# Patient Record
Sex: Male | Born: 1960 | Race: Black or African American | Hispanic: No | Marital: Married | Smoking: Never smoker
Health system: Southern US, Community
[De-identification: ages and names within clinical notes are randomized; demographics above are authoritative.]

## PROBLEM LIST (undated history)

## (undated) DIAGNOSIS — E291 Testicular hypofunction: Secondary | ICD-10-CM

## (undated) DIAGNOSIS — N183 Chronic kidney disease, stage 3 unspecified: Secondary | ICD-10-CM

## (undated) DIAGNOSIS — N529 Male erectile dysfunction, unspecified: Secondary | ICD-10-CM

## (undated) DIAGNOSIS — E119 Type 2 diabetes mellitus without complications: Secondary | ICD-10-CM

## (undated) DIAGNOSIS — I4510 Unspecified right bundle-branch block: Secondary | ICD-10-CM

## (undated) DIAGNOSIS — N201 Calculus of ureter: Secondary | ICD-10-CM

## (undated) DIAGNOSIS — I10 Essential (primary) hypertension: Secondary | ICD-10-CM

## (undated) DIAGNOSIS — Z794 Long term (current) use of insulin: Secondary | ICD-10-CM

## (undated) DIAGNOSIS — Z87442 Personal history of urinary calculi: Secondary | ICD-10-CM

## (undated) DIAGNOSIS — E785 Hyperlipidemia, unspecified: Secondary | ICD-10-CM

## (undated) DIAGNOSIS — G629 Polyneuropathy, unspecified: Secondary | ICD-10-CM

## (undated) DIAGNOSIS — G4730 Sleep apnea, unspecified: Secondary | ICD-10-CM

## (undated) DIAGNOSIS — N189 Chronic kidney disease, unspecified: Secondary | ICD-10-CM

## (undated) DIAGNOSIS — G4733 Obstructive sleep apnea (adult) (pediatric): Secondary | ICD-10-CM

## (undated) DIAGNOSIS — Z9989 Dependence on other enabling machines and devices: Secondary | ICD-10-CM

## (undated) DIAGNOSIS — C61 Malignant neoplasm of prostate: Secondary | ICD-10-CM

## (undated) HISTORY — DX: Testicular hypofunction: E29.1

## (undated) HISTORY — DX: Essential (primary) hypertension: I10

## (undated) HISTORY — DX: Unspecified right bundle-branch block: I45.10

## (undated) HISTORY — DX: Chronic kidney disease, unspecified: N18.9

## (undated) HISTORY — DX: Hyperlipidemia, unspecified: E78.5

## (undated) HISTORY — DX: Male erectile dysfunction, unspecified: N52.9

## (undated) HISTORY — DX: Sleep apnea, unspecified: G47.30

---

## 1999-05-03 HISTORY — PX: RHINOPLASTY: SUR1284

## 2000-05-02 HISTORY — PX: ANKLE SURGERY: SHX546

## 2002-01-14 ENCOUNTER — Encounter: Admission: RE | Admit: 2002-01-14 | Discharge: 2002-02-12 | Payer: Self-pay | Admitting: Internal Medicine

## 2003-03-31 ENCOUNTER — Encounter: Admission: RE | Admit: 2003-03-31 | Discharge: 2003-06-29 | Payer: Self-pay | Admitting: Internal Medicine

## 2010-10-28 ENCOUNTER — Encounter (HOSPITAL_COMMUNITY): Payer: Self-pay

## 2010-10-28 ENCOUNTER — Emergency Department (HOSPITAL_COMMUNITY): Payer: BC Managed Care – PPO

## 2010-10-28 ENCOUNTER — Emergency Department (HOSPITAL_COMMUNITY)
Admission: EM | Admit: 2010-10-28 | Discharge: 2010-10-28 | Disposition: A | Discharge status: Home / Self Care | Payer: BC Managed Care – PPO | Attending: Emergency Medicine | Admitting: Emergency Medicine

## 2010-10-28 DIAGNOSIS — N200 Calculus of kidney: Secondary | ICD-10-CM | POA: Insufficient documentation

## 2010-10-28 DIAGNOSIS — E119 Type 2 diabetes mellitus without complications: Secondary | ICD-10-CM | POA: Insufficient documentation

## 2010-10-28 DIAGNOSIS — R112 Nausea with vomiting, unspecified: Secondary | ICD-10-CM | POA: Insufficient documentation

## 2010-10-28 DIAGNOSIS — R3129 Other microscopic hematuria: Secondary | ICD-10-CM | POA: Insufficient documentation

## 2010-10-28 DIAGNOSIS — R109 Unspecified abdominal pain: Secondary | ICD-10-CM | POA: Insufficient documentation

## 2010-10-28 LAB — URINALYSIS, ROUTINE W REFLEX MICROSCOPIC
Bilirubin Urine: NEGATIVE
Nitrite: NEGATIVE
Protein, ur: 30 mg/dL — AB
Specific Gravity, Urine: 1.026 (ref 1.005–1.030)
Urobilinogen, UA: 0.2 mg/dL (ref 0.0–1.0)

## 2010-10-28 LAB — BASIC METABOLIC PANEL
Calcium: 9.6 mg/dL (ref 8.4–10.5)
GFR calc Af Amer: >60 mL/min (ref >60)
GFR calc non Af Amer: 50 mL/min — ABNORMAL LOW (ref >60)
Potassium: 3.6 mEq/L (ref 3.5–5.1)
Sodium: 139 mEq/L (ref 135–145)

## 2010-10-28 LAB — DIFFERENTIAL
Basophils Absolute: 0 10*3/uL (ref 0.0–0.1)
Basophils Relative: 0 % (ref 0–1)
Eosinophils Absolute: 0.1 10*3/uL (ref 0.0–0.7)
Eosinophils Relative: 1 % (ref 0–5)
Monocytes Absolute: 0.7 10*3/uL (ref 0.1–1.0)
Monocytes Relative: 7 % (ref 3–12)

## 2010-10-28 LAB — CBC
MCH: 28.7 pg (ref 26.0–34.0)
MCHC: 33.5 g/dL (ref 30.0–36.0)
RDW: 12.4 % (ref 11.5–15.5)

## 2010-10-28 LAB — URINE MICROSCOPIC-ADD ON

## 2013-03-12 ENCOUNTER — Other Ambulatory Visit: Payer: Self-pay | Admitting: Internal Medicine

## 2013-04-04 ENCOUNTER — Encounter: Payer: Self-pay | Admitting: Internal Medicine

## 2013-04-10 ENCOUNTER — Encounter: Payer: Self-pay | Admitting: Internal Medicine

## 2013-04-10 DIAGNOSIS — E1129 Type 2 diabetes mellitus with other diabetic kidney complication: Secondary | ICD-10-CM | POA: Insufficient documentation

## 2013-04-10 DIAGNOSIS — E349 Endocrine disorder, unspecified: Secondary | ICD-10-CM | POA: Insufficient documentation

## 2013-04-10 DIAGNOSIS — I10 Essential (primary) hypertension: Secondary | ICD-10-CM | POA: Insufficient documentation

## 2013-04-10 DIAGNOSIS — E1169 Type 2 diabetes mellitus with other specified complication: Secondary | ICD-10-CM | POA: Insufficient documentation

## 2013-04-10 DIAGNOSIS — E559 Vitamin D deficiency, unspecified: Secondary | ICD-10-CM | POA: Insufficient documentation

## 2013-04-10 NOTE — Progress Notes (Signed)
This encounter was created in error - please disregard.

## 2013-04-20 ENCOUNTER — Other Ambulatory Visit: Payer: Self-pay | Admitting: Internal Medicine

## 2013-04-22 ENCOUNTER — Other Ambulatory Visit: Payer: Self-pay | Admitting: Physician Assistant

## 2013-04-22 ENCOUNTER — Ambulatory Visit (INDEPENDENT_AMBULATORY_CARE_PROVIDER_SITE_OTHER): Payer: BC Managed Care – PPO | Admitting: Physician Assistant

## 2013-04-22 ENCOUNTER — Encounter: Payer: Self-pay | Admitting: Physician Assistant

## 2013-04-22 VITALS — BP 132/82 | HR 76 | Temp 97.9°F | Resp 16 | Ht 73.5 in | Wt 296.0 lb

## 2013-04-22 DIAGNOSIS — Z79899 Other long term (current) drug therapy: Secondary | ICD-10-CM

## 2013-04-22 DIAGNOSIS — E782 Mixed hyperlipidemia: Secondary | ICD-10-CM

## 2013-04-22 DIAGNOSIS — R7309 Other abnormal glucose: Secondary | ICD-10-CM

## 2013-04-22 DIAGNOSIS — I10 Essential (primary) hypertension: Secondary | ICD-10-CM

## 2013-04-22 DIAGNOSIS — E559 Vitamin D deficiency, unspecified: Secondary | ICD-10-CM

## 2013-04-22 DIAGNOSIS — E119 Type 2 diabetes mellitus without complications: Secondary | ICD-10-CM

## 2013-04-22 LAB — HEPATIC FUNCTION PANEL
ALT: 36 U/L (ref 0–53)
AST: 19 U/L (ref 0–37)
Albumin: 4.4 g/dL (ref 3.5–5.2)
Bilirubin, Direct: 0.3 mg/dL (ref 0.0–0.3)
Total Protein: 6.8 g/dL (ref 6.0–8.3)

## 2013-04-22 LAB — BASIC METABOLIC PANEL WITH GFR
BUN: 17 mg/dL (ref 6–23)
CO2: 31 mEq/L (ref 19–32)
Calcium: 10 mg/dL (ref 8.4–10.5)
Creat: 1.48 mg/dL — ABNORMAL HIGH (ref 0.50–1.35)
GFR, Est African American: 63 mL/min
Glucose, Bld: 135 mg/dL — ABNORMAL HIGH (ref 70–99)

## 2013-04-22 LAB — CBC WITH DIFFERENTIAL/PLATELET
Basophils Relative: 0 % (ref 0–1)
Eosinophils Absolute: 0.3 10*3/uL (ref 0.0–0.7)
Eosinophils Relative: 5 % (ref 0–5)
Hemoglobin: 18.3 g/dL — ABNORMAL HIGH (ref 13.0–17.0)
Lymphs Abs: 2.8 10*3/uL (ref 0.7–4.0)
MCH: 29.1 pg (ref 26.0–34.0)
MCHC: 35.4 g/dL (ref 30.0–36.0)
MCV: 82.3 fL (ref 78.0–100.0)
Monocytes Relative: 6 % (ref 3–12)
Neutrophils Relative %: 39 % — ABNORMAL LOW (ref 43–77)
RBC: 6.28 MIL/uL — ABNORMAL HIGH (ref 4.22–5.81)

## 2013-04-22 LAB — HEMOGLOBIN A1C: Mean Plasma Glucose: 166 mg/dL — ABNORMAL HIGH (ref <117)

## 2013-04-22 LAB — LIPID PANEL
Cholesterol: 142 mg/dL (ref 0–200)
Triglycerides: 136 mg/dL (ref <150)
VLDL: 27 mg/dL (ref 0–40)

## 2013-04-22 NOTE — Patient Instructions (Signed)
   Bad carbs also include fruit juice, alcohol, and sweet tea. These are empty calories that do not signal to your brain that you are full.   Please remember the good carbs are still carbs which convert into sugar. So please measure them out no more than 1/2-1 cup of rice, oatmeal, pasta, and beans.  Veggies are however free foods! Pile them on.   I like lean protein at every meal such as chicken, turkey, pork chops, cottage cheese, etc. Just do not fry these meats and please center your meal around vegetable, the meats should be a side dish.   No all fruit is created equal. Please see the list below, the fruit at the bottom is higher in sugars than the fruit at the top   Remember exercise is great for your cardiovascular health and can help with weight loss but YOU CAN NOT OUT RUN YOUR FORK!     

## 2013-04-22 NOTE — Progress Notes (Signed)
HPI Patient presents for 3 month follow up with hypertension, hyperlipidemia, diabetes and vitamin D. Patient's blood pressure has been controlled at home.  Patient denies chest pain, shortness of breath, dizziness.  Patient's cholesterol is diet controlled.The cholesterol last visit was LDL 45, trigs 172, HDL 44.  The patient has been working on diet and exercise for Diabetes, and denies changes in vision, polys, and paresthesias. 8.9 (11.2). He states his average sugar at home is 112.  Last kidney function was GFR of 70, BUN 18, Cr 1.37 (1.62)  His last hemoglobin was 17.1.  Patient is on Vitamin D supplement.   Current Medications:  Current Outpatient Prescriptions on File Prior to Visit  Medication Sig Dispense Refill  . aspirin EC 81 MG tablet Take 81 mg by mouth daily.      . Cholecalciferol (VITAMIN D-3) 5000 UNITS TABS Take 5,000 Units by mouth daily.      . INVOKANA 300 MG TABS TAKE ONE TABLET BY MOUTH ONE TIME DAILY for diabetes  30 tablet  2  . lisinopril (PRINIVIL,ZESTRIL) 20 MG tablet Take 10 mg by mouth daily.      . metFORMIN (GLUCOPHAGE-XR) 500 MG 24 hr tablet Take 1,000 mg by mouth 2 (two) times daily.      Marland Kitchen testosterone cypionate (DEPOTESTOTERONE CYPIONATE) 200 MG/ML injection Inject 200 mg into the muscle every 14 (fourteen) days. 1 1/2 cc every 2 weeks      . VICTOZA 18 MG/3ML SOPN INJECT 1.8 MG DAILY  9 mL  4   No current facility-administered medications on file prior to visit.   Medical History:  Past Medical History  Diagnosis Date  . Hypertension   . Hyperlipidemia   . Diabetes mellitus without complication   . Hypogonadism male   . Right bundle branch block   . Obstructive sleep apnea   . Kidney stones    Allergies: No Known Allergies  ROS Constitutional: Denies fever, chills, headaches, insomnia, fatigue, night sweats Eyes: Denies redness, blurred vision, diplopia, discharge, itchy, watery eyes.  ENT: Denies congestion, post nasal drip, sore throat,  earache, dental pain, Tinnitus, Vertigo, Sinus pain, snoring.  Cardio: Denies chest pain, palpitations, irregular heartbeat, dyspnea, diaphoresis, orthopnea, PND, claudication, edema Respiratory: denies cough, shortness of breath, wheezing.  Gastrointestinal: Denies dysphagia, heartburn, AB pain/ cramps, N/V, diarrhea, constipation, hematemesis, melena, hematochezia,  hemorrhoids Genitourinary: Denies dysuria, frequency, urgency, nocturia, hesitancy, discharge, hematuria, flank pain Musculoskeletal: Denies myalgia, stiffness, pain, swelling and strain/sprain. Skin: Denies pruritis, rash, changing in skin lesion Neuro: Denies Weakness, tremor, incoordination, spasms, pain Psychiatric: Denies confusion, memory loss, sensory loss Endocrine: Denies change in weight, skin, hair change, nocturia Diabetic Polys, Denies visual blurring, hyper /hypo glycemic episodes, and paresthesia, Heme/Lymph: Denies Excessive bleeding, bruising, enlarged lymph nodes  Family history- Review and unchanged Social history- Review and unchanged Physical Exam: Filed Vitals:   04/22/13 1641  BP: 132/82  Pulse: 76  Temp: 97.9 F (36.6 C)  Resp: 16   Filed Weights   04/22/13 1641  Weight: 296 lb (134.265 kg)   General Appearance: Well nourished, in no apparent distress. Eyes: PERRLA, EOMs, conjunctiva no swelling or erythema Sinuses: No Frontal/maxillary tenderness ENT/Mouth: Ext aud canals clear, TMs without erythema, bulging. No erythema, swelling, or exudate on post pharynx.  Tonsils not swollen or erythematous. Hearing normal.  Neck: Supple, thyroid normal.  Respiratory: Respiratory effort normal, BS equal bilaterally without rales, rhonchi, wheezing or stridor.  Cardio: RRR with no MRGs. Brisk peripheral pulses without edema.  Abdomen:  Soft, + BS.  Non tender, no guarding, rebound, hernias, masses. Lymphatics: Non tender without lymphadenopathy.  Musculoskeletal: Full ROM, 5/5 strength, normal gait.   Skin: Warm, dry without rashes, lesions, ecchymosis.  Neuro: Cranial nerves intact. No cerebellar symptoms. Sensation intact.  Psych: Awake and oriented X 3, normal affect, Insight and Judgment appropriate.   Assessment and Plan:  Hypertension: Continue medication, monitor blood pressure at home. Continue DASH diet. Cholesterol: Continue diet and exercise. Check cholesterol.  Diabetes-Continue diet and exercise. 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  Vitamin D Def- check level and continue medications.  Hypogonadism- continue testosterone- last shot one week ago so will not check level.  Continue diet and meds as discussed. Further disposition pending results of labs. Discussed med's effects and SE's.    Quentin Mulling 4:54 PM

## 2013-04-23 LAB — INSULIN, FASTING: Insulin fasting, serum: 17 u[IU]/mL (ref 3–28)

## 2013-04-23 LAB — VITAMIN D 25 HYDROXY (VIT D DEFICIENCY, FRACTURES): Vit D, 25-Hydroxy: 55 ng/mL (ref 30–89)

## 2013-04-23 LAB — IRON AND TIBC
%SAT: 36 % (ref 20–55)
Iron: 104 ug/dL (ref 42–165)
UIBC: 188 ug/dL (ref 125–400)

## 2013-04-23 LAB — FERRITIN: Ferritin: 167 ng/mL (ref 22–322)

## 2013-06-09 ENCOUNTER — Other Ambulatory Visit: Payer: Self-pay | Admitting: Internal Medicine

## 2013-07-23 ENCOUNTER — Encounter: Payer: Self-pay | Admitting: Internal Medicine

## 2013-07-23 ENCOUNTER — Ambulatory Visit: Payer: Self-pay | Admitting: Physician Assistant

## 2013-07-30 ENCOUNTER — Other Ambulatory Visit: Payer: Self-pay | Admitting: Physician Assistant

## 2013-08-08 ENCOUNTER — Other Ambulatory Visit: Payer: Self-pay | Admitting: Physician Assistant

## 2013-08-15 ENCOUNTER — Encounter: Payer: Self-pay | Admitting: Physician Assistant

## 2013-08-15 ENCOUNTER — Ambulatory Visit (INDEPENDENT_AMBULATORY_CARE_PROVIDER_SITE_OTHER): Payer: BC Managed Care – PPO | Admitting: Physician Assistant

## 2013-08-15 VITALS — BP 120/80 | HR 84 | Temp 98.8°F | Resp 16 | Ht 73.5 in | Wt 280.0 lb

## 2013-08-15 DIAGNOSIS — J111 Influenza due to unidentified influenza virus with other respiratory manifestations: Secondary | ICD-10-CM

## 2013-08-15 DIAGNOSIS — J101 Influenza due to other identified influenza virus with other respiratory manifestations: Secondary | ICD-10-CM

## 2013-08-15 LAB — POC INFLUENZA A&B (BINAX/QUICKVUE)
INFLUENZA A, POC: NEGATIVE
INFLUENZA B, POC: POSITIVE

## 2013-08-15 MED ORDER — HYDROCODONE-ACETAMINOPHEN 5-325 MG PO TABS: 1.0000 | ORAL_TABLET | Freq: Four times a day (QID) | ORAL | Status: DC | PRN

## 2013-08-15 MED ORDER — PROMETHAZINE-CODEINE 6.25-10 MG/5ML PO SYRP: 5.0000 mL | ORAL_SOLUTION | Freq: Four times a day (QID) | ORAL | Status: DC | PRN

## 2013-08-15 MED ORDER — PREDNISONE 20 MG PO TABS: ORAL_TABLET | ORAL | Status: DC

## 2013-08-15 MED ORDER — AZITHROMYCIN 250 MG PO TABS: ORAL_TABLET | ORAL | Status: DC

## 2013-08-15 NOTE — Progress Notes (Signed)
   Subjective:    Patient ID: Carlos [name], male    DOB: 07/16/1960, 53 y.o.   MRN: [id number]  Sinus Problem This is a new problem. Episode onset: 2 days. There has been no fever. Associated symptoms include chills (at night with sweats. ), congestion, coughing, headaches, neck pain, a sore throat and swollen glands. Pertinent negatives include no diaphoresis, ear pain, hoarse voice, shortness of breath, sinus pressure or sneezing. Treatments tried: dayquil. The treatment provided no relief.    Wt Readings from Last 3 Encounters:  08/15/13 280 lb (127.007 kg)  04/22/13 296 lb (134.265 kg)   Review of Systems  Constitutional: Positive for chills (at night with sweats. ). Negative for fever and diaphoresis.  HENT: Positive for congestion, postnasal drip, rhinorrhea and sore throat. Negative for ear pain, hoarse voice, sinus pressure, sneezing, trouble swallowing and voice change.   Eyes: Negative.   Respiratory: Positive for cough and wheezing. Negative for chest tightness and shortness of breath.   Cardiovascular: Negative.   Gastrointestinal: Negative.   Genitourinary: Negative.   Musculoskeletal: Positive for neck pain.  Neurological: Positive for headaches.       Objective:   Physical Exam  Constitutional: He is oriented to person, place, and time. He appears well-developed and well-nourished.  HENT:  Head: Normocephalic and atraumatic.  Right Ear: External ear normal.  Left Ear: External ear normal.  Nose: Nose normal.  Mouth/Throat: Oropharynx is clear and moist.  Eyes: Conjunctivae are normal. Pupils are equal, round, and reactive to light.  Neck: Normal range of motion. Neck supple.  Cardiovascular: Normal rate, regular rhythm and normal heart sounds.   No murmur heard. Pulmonary/Chest: Effort normal. No respiratory distress. He has wheezes. He has no rales. He exhibits no tenderness.  Abdominal: Soft. Bowel sounds are normal.  Lymphadenopathy:    He has no cervical  adenopathy.  Neurological: He is alert and oriented to person, place, and time.  Skin: Skin is warm and dry.      Assessment & Plan:  Influenza B - Plan: predniSONE (DELTASONE) 20 MG tablet, promethazine-codeine (PHENERGAN WITH CODEINE) 6.25-10 MG/5ML syrup, azithromycin (ZITHROMAX) 250 MG tablet, POC Influenza A&B, HYDROcodone-acetaminophen (NORCO) 5-325 MG per tablet

## 2013-08-15 NOTE — Patient Instructions (Signed)

## 2013-08-22 LAB — HM DIABETES EYE EXAM

## 2013-09-02 ENCOUNTER — Encounter: Payer: Self-pay | Admitting: Physician Assistant

## 2013-09-02 ENCOUNTER — Ambulatory Visit (INDEPENDENT_AMBULATORY_CARE_PROVIDER_SITE_OTHER): Payer: BC Managed Care – PPO | Admitting: Physician Assistant

## 2013-09-02 VITALS — BP 110/78 | HR 80 | Temp 98.2°F | Resp 16 | Ht 73.5 in | Wt 279.0 lb

## 2013-09-02 DIAGNOSIS — Z125 Encounter for screening for malignant neoplasm of prostate: Secondary | ICD-10-CM

## 2013-09-02 DIAGNOSIS — Z0000 Encounter for general adult medical examination without abnormal findings: Secondary | ICD-10-CM

## 2013-09-02 DIAGNOSIS — E559 Vitamin D deficiency, unspecified: Secondary | ICD-10-CM

## 2013-09-02 DIAGNOSIS — E119 Type 2 diabetes mellitus without complications: Secondary | ICD-10-CM

## 2013-09-02 DIAGNOSIS — E291 Testicular hypofunction: Secondary | ICD-10-CM

## 2013-09-02 DIAGNOSIS — Z1211 Encounter for screening for malignant neoplasm of colon: Secondary | ICD-10-CM

## 2013-09-02 DIAGNOSIS — B351 Tinea unguium: Secondary | ICD-10-CM

## 2013-09-02 DIAGNOSIS — Z79899 Other long term (current) drug therapy: Secondary | ICD-10-CM

## 2013-09-02 DIAGNOSIS — E782 Mixed hyperlipidemia: Secondary | ICD-10-CM

## 2013-09-02 DIAGNOSIS — I10 Essential (primary) hypertension: Secondary | ICD-10-CM

## 2013-09-02 MED ORDER — SYRINGE (DISPOSABLE) 3 ML MISC: Status: DC

## 2013-09-02 MED ORDER — LIRAGLUTIDE 18 MG/3ML ~~LOC~~ SOPN: PEN_INJECTOR | SUBCUTANEOUS | Status: DC

## 2013-09-02 MED ORDER — TERBINAFINE HCL 250 MG PO TABS: 250.0000 mg | ORAL_TABLET | Freq: Every day | ORAL | Status: DC

## 2013-09-02 MED ORDER — TESTOSTERONE CYPIONATE 200 MG/ML IM SOLN: 200.0000 mg | INTRAMUSCULAR | Status: DC

## 2013-09-02 NOTE — Patient Instructions (Signed)
Preventative Care for Adults, Male       REGULAR HEALTH EXAMS:  A routine yearly physical is a good way to check in with your primary care provider about your health and preventive screening. It is also an opportunity to share updates about your health and any concerns you have, and receive a thorough all-over exam.   Most health insurance companies pay for at least some preventative services.  Check with your health plan for specific coverages.  WHAT PREVENTATIVE SERVICES DO MEN NEED?  Adult men should have their weight and blood pressure checked regularly.   Men age 35 and older should have their cholesterol levels checked regularly.  Beginning at age 50 and continuing to age 75, men should be screened for colorectal cancer.  Certain people should may need continued testing until age 85.  Other cancer screening may include exams for testicular and prostate cancer.  Updating vaccinations is part of preventative care.  Vaccinations help protect against diseases such as the flu.  Lab tests are generally done as part of preventative care to screen for anemia and blood disorders, to screen for problems with the kidneys and liver, to screen for bladder problems, to check blood sugar, and to check your cholesterol level.  Preventative services generally include counseling about diet, exercise, avoiding tobacco, drugs, excessive alcohol consumption, and sexually transmitted infections.    GENERAL RECOMMENDATIONS FOR GOOD HEALTH:  Healthy diet:  Eat a variety of foods, including fruit, vegetables, animal or vegetable protein, such as meat, fish, chicken, and eggs, or beans, lentils, tofu, and grains, such as rice.  Drink plenty of water daily.  Decrease saturated fat in the diet, avoid lots of red meat, processed foods, sweets, fast foods, and fried foods.  Exercise:  Aerobic exercise helps maintain good heart health. At least 30-40 minutes of moderate-intensity exercise is recommended.  For example, a brisk walk that increases your heart rate and breathing. This should be done on most days of the week.   Find a type of exercise or a variety of exercises that you enjoy so that it becomes a part of your daily life.  Examples are running, walking, swimming, water aerobics, and biking.  For motivation and support, explore group exercise such as aerobic class, spin class, Zumba, Yoga,or  martial arts, etc.    Set exercise goals for yourself, such as a certain weight goal, walk or run in a race such as a 5k walk/run.  Speak to your primary care provider about exercise goals.  Disease prevention:  If you smoke or chew tobacco, find out from your caregiver how to quit. It can literally save your life, no matter how long you have been a tobacco user. If you do not use tobacco, never begin.   Maintain a healthy diet and normal weight. Increased weight leads to problems with blood pressure and diabetes.   The Body Mass Index or BMI is a way of measuring how much of your body is fat. Having a BMI above 27 increases the risk of heart disease, diabetes, hypertension, stroke and other problems related to obesity. Your caregiver can help determine your BMI and based on it develop an exercise and dietary program to help you achieve or maintain this important measurement at a healthful level.  High blood pressure causes heart and blood vessel problems.  Persistent high blood pressure should be treated with medicine if weight loss and exercise do not work.   Fat and cholesterol leaves deposits in your arteries   that can block them. This causes heart disease and vessel disease elsewhere in your body.  If your cholesterol is found to be high, or if you have heart disease or certain other medical conditions, then you may need to have your cholesterol monitored frequently and be treated with medication.   Ask if you should have a stress test if your history suggests this. A stress test is a test done on  a treadmill that looks for heart disease. This test can find disease prior to there being a problem.  Avoid drinking alcohol in excess (more than two drinks per day).  Avoid use of street drugs. Do not share needles with anyone. Ask for professional help if you need assistance or instructions on stopping the use of alcohol, cigarettes, and/or drugs.  Brush your teeth twice a day with fluoride toothpaste, and floss once a day. Good oral hygiene prevents tooth decay and gum disease. The problems can be painful, unattractive, and can cause other health problems. Visit your dentist for a routine oral and dental check up and preventive care every 6-12 months.   Look at your skin regularly.  Use a mirror to look at your back. Notify your caregivers of changes in moles, especially if there are changes in shapes, colors, a size larger than a pencil eraser, an irregular border, or development of new moles.  Safety:  Use seatbelts 100% of the time, whether driving or as a passenger.  Use safety devices such as hearing protection if you work in environments with loud noise or significant background noise.  Use safety glasses when doing any work that could send debris in to the eyes.  Use a helmet if you ride a bike or motorcycle.  Use appropriate safety gear for contact sports.  Talk to your caregiver about gun safety.  Use sunscreen with a SPF (or skin protection factor) of 15 or greater.  Lighter skinned people are at a greater risk of skin cancer. Don't forget to also wear sunglasses in order to protect your eyes from too much damaging sunlight. Damaging sunlight can accelerate cataract formation.   Practice safe sex. Use condoms. Condoms are used for birth control and to help reduce the spread of sexually transmitted infections (or STIs).  Some of the STIs are gonorrhea (the clap), chlamydia, syphilis, trichomonas, herpes, HPV (human papilloma virus) and HIV (human immunodeficiency virus) which causes AIDS.  The herpes, HIV and HPV are viral illnesses that have no cure. These can result in disability, cancer and death.   Keep carbon monoxide and smoke detectors in your home functioning at all times. Change the batteries every 6 months or use a model that plugs into the wall.   Vaccinations:  Stay up to date with your tetanus shots and other required immunizations. You should have a booster for tetanus every 10 years. Be sure to get your flu shot every year, since 5%-20% of the [place] population comes down with the flu. The flu vaccine changes each year, so being vaccinated once is not enough. Get your shot in the fall, before the flu season peaks.   Other vaccines to consider:  Pneumococcal vaccine to protect against certain types of pneumonia.  This is normally recommended for adults age 65 or older.  However, adults younger than 51 years old with certain underlying conditions such as diabetes, heart or lung disease should also receive the vaccine.  Shingles vaccine to protect against Varicella Zoster if you are older than age 60, or younger   than 51 years old with certain underlying illness.  Hepatitis A vaccine to protect against a form of infection of the liver by a virus acquired from food.  Hepatitis B vaccine to protect against a form of infection of the liver by a virus acquired from blood or body fluids, particularly if you work in health care.  If you plan to travel internationally, check with your local health department for specific vaccination recommendations.  Cancer Screening:  Most routine colon cancer screening begins at the age of 50. On a yearly basis, doctors may provide special easy to use take-home tests to check for hidden blood in the stool. Sigmoidoscopy or colonoscopy can detect the earliest forms of colon cancer and is life saving. These tests use a small camera at the end of a tube to directly examine the colon. Speak to your caregiver about this at age 50, when routine  screening begins (and is repeated every 5 years unless early forms of pre-cancerous polyps or small growths are found).   At the age of 50 men usually start screening for prostate cancer every year. Screening may begin at a younger age for those with higher risk. Those at higher risk include African-Americans or having a family history of prostate cancer. There are two types of tests for prostate cancer:   Prostate-specific antigen (PSA) testing. Recent studies raise questions about prostate cancer using PSA and you should discuss this with your caregiver.   Digital rectal exam (in which your doctor's lubricated and gloved finger feels for enlargement of the prostate through the anus).   Screening for testicular cancer.  Do a monthly exam of your testicles. Gently roll each testicle between your thumb and fingers, feeling for any abnormal lumps. The best time to do this is after a hot shower or bath when the tissues are looser. Notify your caregivers of any lumps, tenderness or changes in size or shape immediately.     

## 2013-09-02 NOTE — Progress Notes (Signed)
Complete Physical HPI 53 y.o. male  presents for a complete physical. His blood pressure has been controlled at home, today their BP is BP: 110/78 mmHg He does not workout, does some lawn work. He denies chest pain, shortness of breath, dizziness.  He is not on cholesterol medication and denies myalgias. His cholesterol is at goal. The cholesterol last visit was:   Lab Results  Component Value Date   CHOL 142 04/22/2013   HDL 46 04/22/2013   LDLCALC 69 04/22/2013   TRIG 136 04/22/2013   CHOLHDL 3.1 04/22/2013   He has been working on diet and exercise for diabetes, and denies paresthesia of the feet, polydipsia and polyuria. Last A1C in the office was:  Lab Results  Component Value Date   HGBA1C 7.4* 04/22/2013   Patient is on Vitamin D supplement.  Wt Readings from Last 3 Encounters:  09/02/13 279 lb (126.554 kg)  08/15/13 280 lb (127.007 kg)  04/22/13 296 lb (134.265 kg)   He is has a history of testosterone deficiency and is not on testosterone replacement at this time, his prescription ran out. He states that the testosterone did help with his energy, libido, muscle mass. He would like to get back on it.  Obesity- he has done a great job with weight loss.  OSA on CPAP.   Current Medications:  Current Outpatient Prescriptions on File Prior to Visit  Medication Sig Dispense Refill  . aspirin EC 81 MG tablet Take 81 mg by mouth daily.      Marland Kitchen azithromycin (ZITHROMAX) 250 MG tablet 2 tablets by mouth today then one tablet daily for 4 days.  6 tablet  1  . Cholecalciferol (VITAMIN D-3) 5000 UNITS TABS Take 5,000 Units by mouth daily.      Marland Kitchen HYDROcodone-acetaminophen (NORCO) 5-325 MG per tablet Take 1 tablet by mouth every 6 (six) hours as needed for moderate pain.  60 tablet  0  . INVOKANA 300 MG TABS TAKE ONE TABLET BY MOUTH ONE TIME DAILY for diabetes  30 tablet  2  . lisinopril (PRINIVIL,ZESTRIL) 20 MG tablet Take 10 mg by mouth daily.      . metFORMIN (GLUCOPHAGE-XR) 500 MG  24 hr tablet TAKE FOUR TABLETS BY MOUTH DAILY   120 tablet  5  . predniSONE (DELTASONE) 20 MG tablet Take one pill two times daily for 3 days, take one pill daily for 4 days.  10 tablet  0  . promethazine-codeine (PHENERGAN WITH CODEINE) 6.25-10 MG/5ML syrup Take 5 mLs by mouth every 6 (six) hours as needed for cough.  240 mL  0  . testosterone cypionate (DEPOTESTOTERONE CYPIONATE) 200 MG/ML injection Inject 200 mg into the muscle every 14 (fourteen) days. 1 1/2 cc every 2 weeks      . VICTOZA 18 MG/3ML SOPN INJECT 1.8 MG DAILY  9 mL  4   No current facility-administered medications on file prior to visit.   Health Maintenance:  Immunization History  Administered Date(s) Administered  . DTaP 04/02/2012  . Pneumococcal Polysaccharide-23 04/02/2012   Tetanus: 2013 Pneumovax: 2013 Flu vaccine: did not have it, got the flu Zostavax: N/A DEXA: N/A Colonoscopy: DUE this year EGD: N/A  Allergies: No Known Allergies Medical History:  Past Medical History  Diagnosis Date  . Hypertension   . Hyperlipidemia   . Diabetes mellitus without complication   . Hypogonadism male   . Right bundle branch block   . Obstructive sleep apnea   . Kidney stones  Surgical History:  Past Surgical History  Procedure Laterality Date  . Ankle surgery  2002  . Rhinoplasty  2001   Family History:  Family History  Problem Relation Age of Onset  . Hypertension Mother   . Cancer Mother     breast cancer  . Diabetes Father    Social History:   History  Substance Use Topics  . Smoking status: Never Smoker   . Smokeless tobacco: Not on file  . Alcohol Use: Yes   ROS:  [X]  = complains of  [ ]  = denies  General: Fatigue [ ]  Fever [ ]  Chills [ ]  Weakness [ ]   Insomnia [ ]  Weight change [ ]  Night sweats [ ]   Change in appetite [ ]  Eyes: Redness [ ]  Blurred vision [ ]  Diplopia [ ]  Discharge [ ]   ENT: Congestion [ ]  Sinus Pain [ ]  Post Nasal Drip [ ]  Sore Throat [ ]  Earache [ ]  hearing loss [ ]   Tinnitus [ ]  Snoring [ ]   Cardiac: Chest pain/pressure [ ]  SOB [ ]  Orthopnea [ ]   Palpitations [ ]   Paroxysmal nocturnal dyspnea[ ]  Claudication [ ]  Edema [ ]   Pulmonary: Cough [ ]  Wheezing[ ]   SOB [ ]   Pleurisy [ ]   GI: Nausea [ ]  Vomiting[ ]  Dysphagia[ ]  Heartburn[ ]  Abdominal pain [ ]  Constipation [ ] ; Diarrhea [ ]  BRBPR [ ]  Melena[ ]  Bloating [ ]  Hemorrhoids [ ]   GU: Hematuria[ ]  Dysuria [ ]  Nocturia[ ]  Urgency [ ]   Hesitancy [ ]  Discharge [ ]  Frequency [ ]   Neuro: Headaches[ ]  Vertigo[ ]  Paresthesias[ ]  Spasm [ ]  Speech changes [ ]  Incoordination [ ]   Ortho: Arthritis [ ]  Joint pain [ ]  Muscle pain [ ]  Joint swelling [ ]  Back Pain [ ]  Skin:  Rash [ ]   Pruritis [ ]  Change in skin lesion [ ]   Psych: Depression[ ]  Anxiety[ ]  Confusion [ ]  Memory loss [ ]   Heme/Lypmh: Bleeding [ ]  Bruising [ ]  Enlarged lymph nodes [ ]   Endocrine: Visual blurring [ ]  Paresthesia [ ]  Polyuria [ ]  Polydypsea [ ]    Heat/cold intolerance [ ]  Hypoglycemia [ ]   Physical Exam: Estimated body mass index is 36.31 kg/(m^2) as calculated from the following:   Height as of this encounter: 6' 1.5" (1.867 m).   Weight as of this encounter: 279 lb (126.554 kg). BP 110/78  Pulse 80  Temp(Src) 98.2 F (36.8 C)  Resp 16  Ht 6' 1.5" (1.867 m)  Wt 279 lb (126.554 kg)  BMI 36.31 kg/m2 General Appearance: Well nourished, in no apparent distress. Eyes: PERRLA, EOMs, conjunctiva no swelling or erythema, normal fundi and vessels. Sinuses: No Frontal/maxillary tenderness ENT/Mouth: Ext aud canals clear, normal light reflex with TMs without erythema, bulging. Good dentition. No erythema, swelling, or exudate on post pharynx. Tonsils not swollen or erythematous. Hearing normal.  Neck: Supple, thyroid normal. No bruits Respiratory: Respiratory effort normal, BS equal bilaterally without rales, rhonchi, wheezing or stridor. Cardio: RRR without murmurs, rubs or gallops. Brisk peripheral pulses without edema.  Chest: symmetric, with  normal excursions and percussion. Abdomen: Soft, +BS. Non tender, no guarding, rebound, hernias, masses, or organomegaly. .  Lymphatics: Non tender without lymphadenopathy.  Genitourinary: defer Musculoskeletal: Full ROM all peripheral extremities,5/5 strength, and normal gait. Skin: Warm, dry without rashes, lesions, ecchymosis.  Neuro: Cranial nerves intact, reflexes equal bilaterally. Normal muscle tone, no cerebellar symptoms. Sensation intact.  Psych: Awake and oriented X 3, normal  affect, Insight and Judgment appropriate.   EKG: WNL no changes.  Assessment and Plan: Hypertension-- continue medications, DASH diet, exercise and monitor at home. Call if greater than 130/80.   Hyperlipidemia--continue medications, check lipids, decrease fatty foods, increase activity.   Diabetes mellitus without complication-Discussed general issues about diabetes pathophysiology and management., Educational material distributed., Suggested low cholesterol diet., Encouraged aerobic exercise., Discussed foot care., Reminded to get yearly retinal exam.  Hypogonadism male-Hypogonadism- continue to monitor, states medication is helping with symptoms of low T, will refill  Obstructive sleep apnea- cont CPAP  Kidney stones- remission  Obesity-Obesity with co morbidities- long discussion about weight loss, diet, and exercise onchomycosis- lamisil once daily- recheck LFTs- 6 weeks Colonoscopy  Discussed med's effects and SE's. Screening labs and tests as requested with regular follow-up as recommended.  Vicie Mutters 3:50 PM

## 2013-09-03 ENCOUNTER — Other Ambulatory Visit: Payer: Self-pay | Admitting: *Deleted

## 2013-09-03 ENCOUNTER — Other Ambulatory Visit: Payer: Self-pay

## 2013-09-03 DIAGNOSIS — IMO0002 Reserved for concepts with insufficient information to code with codable children: Secondary | ICD-10-CM

## 2013-09-03 DIAGNOSIS — E118 Type 2 diabetes mellitus with unspecified complications: Principal | ICD-10-CM

## 2013-09-03 DIAGNOSIS — E1165 Type 2 diabetes mellitus with hyperglycemia: Secondary | ICD-10-CM

## 2013-09-03 LAB — CBC WITH DIFFERENTIAL/PLATELET
BASOS PCT: 1 % (ref 0–1)
Basophils Absolute: 0 10*3/uL (ref 0.0–0.1)
Eosinophils Absolute: 0.1 10*3/uL (ref 0.0–0.7)
Eosinophils Relative: 3 % (ref 0–5)
HCT: 48.5 % (ref 39.0–52.0)
HEMOGLOBIN: 16.8 g/dL (ref 13.0–17.0)
LYMPHS ABS: 2.4 10*3/uL (ref 0.7–4.0)
Lymphocytes Relative: 48 % — ABNORMAL HIGH (ref 12–46)
MCH: 28.6 pg (ref 26.0–34.0)
MCHC: 34.6 g/dL (ref 30.0–36.0)
MCV: 82.5 fL (ref 78.0–100.0)
MONOS PCT: 6 % (ref 3–12)
Monocytes Absolute: 0.3 10*3/uL (ref 0.1–1.0)
NEUTROS ABS: 2.1 10*3/uL (ref 1.7–7.7)
Neutrophils Relative %: 42 % — ABNORMAL LOW (ref 43–77)
Platelets: 238 10*3/uL (ref 150–400)
RBC: 5.88 MIL/uL — AB (ref 4.22–5.81)
RDW: 13.7 % (ref 11.5–15.5)
WBC: 4.9 10*3/uL (ref 4.0–10.5)

## 2013-09-03 LAB — LIPID PANEL
Cholesterol: 116 mg/dL (ref 0–200)
HDL: 54 mg/dL (ref >39)
LDL CALC: 33 mg/dL (ref 0–99)
TRIGLYCERIDES: 146 mg/dL (ref <150)
Total CHOL/HDL Ratio: 2.1 Ratio
VLDL: 29 mg/dL (ref 0–40)

## 2013-09-03 LAB — TSH: TSH: 0.629 u[IU]/mL (ref 0.350–4.500)

## 2013-09-03 LAB — HEPATIC FUNCTION PANEL
ALT: 21 U/L (ref 0–53)
AST: 13 U/L (ref 0–37)
Albumin: 4.4 g/dL (ref 3.5–5.2)
Alkaline Phosphatase: 89 U/L (ref 39–117)
BILIRUBIN DIRECT: 0.3 mg/dL (ref 0.0–0.3)
Indirect Bilirubin: 1.1 mg/dL (ref 0.2–1.2)
Total Bilirubin: 1.4 mg/dL — ABNORMAL HIGH (ref 0.2–1.2)
Total Protein: 6.7 g/dL (ref 6.0–8.3)

## 2013-09-03 LAB — INSULIN, FASTING: Insulin fasting, serum: 15 u[IU]/mL (ref 3–28)

## 2013-09-03 LAB — HEMOGLOBIN A1C
Hgb A1c MFr Bld: 10.2 % — ABNORMAL HIGH (ref <5.7)
MEAN PLASMA GLUCOSE: 246 mg/dL — AB (ref <117)

## 2013-09-03 LAB — IRON AND TIBC
%SAT: 57 % — ABNORMAL HIGH (ref 20–55)
IRON: 144 ug/dL (ref 42–165)
TIBC: 251 ug/dL (ref 215–435)
UIBC: 107 ug/dL — ABNORMAL LOW (ref 125–400)

## 2013-09-03 LAB — PSA: PSA: 0.92 ng/mL (ref <=4.00)

## 2013-09-03 LAB — BASIC METABOLIC PANEL WITH GFR
BUN: 18 mg/dL (ref 6–23)
CHLORIDE: 106 meq/L (ref 96–112)
CO2: 25 meq/L (ref 19–32)
Calcium: 9.9 mg/dL (ref 8.4–10.5)
Creat: 1.3 mg/dL (ref 0.50–1.35)
GFR, EST AFRICAN AMERICAN: 74 mL/min
GFR, Est Non African American: 64 mL/min
GLUCOSE: 100 mg/dL — AB (ref 70–99)
POTASSIUM: 4.3 meq/L (ref 3.5–5.3)
Sodium: 142 mEq/L (ref 135–145)

## 2013-09-03 LAB — VITAMIN D 25 HYDROXY (VIT D DEFICIENCY, FRACTURES): Vit D, 25-Hydroxy: 63 ng/mL (ref 30–89)

## 2013-09-03 LAB — TESTOSTERONE: Testosterone: 166 ng/dL — ABNORMAL LOW (ref 300–890)

## 2013-09-03 LAB — MAGNESIUM: Magnesium: 2 mg/dL (ref 1.5–2.5)

## 2013-09-03 MED ORDER — TESTOSTERONE CYPIONATE 200 MG/ML IM SOLN: 300.0000 mg | INTRAMUSCULAR | Status: DC

## 2013-09-04 LAB — URINALYSIS, ROUTINE W REFLEX MICROSCOPIC
Bilirubin Urine: NEGATIVE
Glucose, UA: >1000 mg/dL — AB
HGB URINE DIPSTICK: NEGATIVE
KETONES UR: NEGATIVE mg/dL
Leukocytes, UA: NEGATIVE
Nitrite: NEGATIVE
PH: 5 (ref 5.0–8.0)
Protein, ur: NEGATIVE mg/dL
Specific Gravity, Urine: 1.036 — ABNORMAL HIGH (ref 1.005–1.030)
Urobilinogen, UA: 0.2 mg/dL (ref 0.0–1.0)

## 2013-09-04 LAB — URINALYSIS, MICROSCOPIC ONLY
Bacteria, UA: NONE SEEN
CASTS: NONE SEEN
RBC / HPF: NONE SEEN RBC/hpf (ref <3)
WBC UA: NONE SEEN WBC/hpf (ref <3)

## 2013-09-04 LAB — MICROALBUMIN / CREATININE URINE RATIO
CREATININE, URINE: 127.1 mg/dL
MICROALB/CREAT RATIO: 75.8 mg/g — AB (ref 0.0–30.0)
Microalb, Ur: 9.63 mg/dL — ABNORMAL HIGH (ref 0.00–1.89)

## 2013-09-10 ENCOUNTER — Ambulatory Visit (INDEPENDENT_AMBULATORY_CARE_PROVIDER_SITE_OTHER): Payer: BC Managed Care – PPO | Admitting: Internal Medicine

## 2013-09-10 ENCOUNTER — Encounter: Payer: Self-pay | Admitting: Internal Medicine

## 2013-09-10 VITALS — BP 112/68 | HR 93 | Temp 97.8°F | Resp 12 | Ht 72.0 in | Wt 284.0 lb

## 2013-09-10 DIAGNOSIS — E119 Type 2 diabetes mellitus without complications: Secondary | ICD-10-CM

## 2013-09-10 NOTE — Patient Instructions (Signed)
Please continue: - Metformin XR 1000 mg 2x a day - Victoza 1.8 mg daily - Invokana 300 mg daily Please return in 1 month with your sugar log.  Check sugars 2x a day, rotating checks.  PATIENT INSTRUCTIONS FOR TYPE 2 DIABETES:  **Please join MyChart!** - see attached instructions about how to join if you have not done so already.  DIET AND EXERCISE Diet and exercise is an important part of diabetic treatment.  We recommended aerobic exercise in the form of brisk walking (working between 40-60% of maximal aerobic capacity, similar to brisk walking) for 150 minutes per week (such as 30 minutes five days per week) along with 3 times per week performing 'resistance' training (using various gauge rubber tubes with handles) 5-10 exercises involving the major muscle groups (upper body, lower body and core) performing 10-15 repetitions (or near fatigue) each exercise. Start at half the above goal but build slowly to reach the above goals. If limited by weight, joint pain, or disability, we recommend daily walking in a swimming pool with water up to waist to reduce pressure from joints while allow for adequate exercise.    BLOOD GLUCOSES Monitoring your blood glucoses is important for continued management of your diabetes. Please check your blood glucoses 2-4 times a day: fasting, before meals and at bedtime (you can rotate these measurements - e.g. one day check before the 3 meals, the next day check before 2 of the meals and before bedtime, etc.).   HYPOGLYCEMIA (low blood sugar) Hypoglycemia is usually a reaction to not eating, exercising, or taking too much insulin/ other diabetes drugs.  Symptoms include tremors, sweating, hunger, confusion, headache, etc. Treat IMMEDIATELY with 15 grams of Carbs:   4 glucose tablets    cup regular juice/soda   2 tablespoons raisins   4 teaspoons sugar   1 tablespoon honey Recheck blood glucose in 15 mins and repeat above if still symptomatic/blood glucose  <100.  RECOMMENDATIONS TO REDUCE YOUR RISK OF DIABETIC COMPLICATIONS: * Take your prescribed MEDICATION(S) * Follow a DIABETIC diet: Complex carbs, fiber rich foods, (monounsaturated and polyunsaturated) fats * AVOID saturated/trans fats, high fat foods, >2,300 mg salt per day. * EXERCISE at least 5 times a week for 30 minutes or preferably daily.  * DO NOT SMOKE OR DRINK more than 1 drink a day. * Check your FEET every day. Do not wear tightfitting shoes. Contact us if you develop an ulcer * See your EYE doctor once a year or more if needed * Get a FLU shot once a year * Get a PNEUMONIA vaccine once before and once after age 66 years  GOALS:  * Your Hemoglobin A1c of <7%  * fasting sugars need to be <130 * after meals sugars need to be <180 (2h after you start eating) * Your Systolic BP should be 865 or lower  * Your Diastolic BP should be 80 or lower  * Your HDL (Good Cholesterol) should be 40 or higher  * Your LDL (Bad Cholesterol) should be 100 or lower. * Your Triglycerides should be 150 or lower  * Your Urine microalbumin (kidney function) should be <30 * Your Body Mass Index should be 25 or lower   We will be glad to help you achieve these goals. Our telephone number is: [phone].

## 2013-09-10 NOTE — Progress Notes (Signed)
Patient ID: Carlos [name], male   DOB: 16-Jul-1960, 53 y.o.   MRN: [id number]  HPI: Carlos [name] is a 53 y.o.-year-old male, referred by his PCP, Dr.McKeown, for management of DM2, non-insulin-dependent, uncontrolled, without complications.  Patient has been diagnosed with diabetes in 2005; he has not been on insulin before. Last hemoglobin A1c was: Lab Results  Component Value Date   HGBA1C 10.2* 09/02/2013   HGBA1C 7.4* 04/22/2013   Pt is on a regimen of: - Metformin XR1000 mg po bid - Victoza 1.8 mg am - started 2 years ago - Invokana 300 mg in am - started 6 mo ago, then stopped, then restarted 6 weeks ago He was on Actos in the past.  Pt checks his sugars once a day and they are: - am: 94-244 - 2h after b'fast: n/c - before lunch: n/c - 2h after lunch: n/c - before dinner: n/c - 2h after dinner: n/c - bedtime: n/c - nighttime: n/c No lows. Lowest sugar was 90s; ? hypoglycemia awareness. Highest sugar was 200s.  Pt's meals are: - Breakfast: bacon or sausage, eggs + cheese on wheat; omelet; fruit maybe - Lunch: [place], oriental, club sandwich - usually eats out - Dinner: meat + veggies + brown rice - Snacks: 1 or 2 - fruit  He exercises 3-4x a week: at home or at the gym: treadmill, bicycle, yardwork  - Has CKD, last BUN/creatinine:  Lab Results  Component Value Date   BUN 18 09/02/2013   CREATININE 1.30 09/02/2013  He is on Lisinopril ~1 year ago (stopped it for 4 months before the last check).  Component     Latest Ref Rng 09/02/2013  Microalb, Ur     0.00 - 1.89 mg/dL 9.63 (H)  Creatinine, Urine      127.1  MICROALB/CREAT RATIO     0.0 - 30.0 mg/g 75.8 (H)   - last set of lipids: Lab Results  Component Value Date   CHOL 116 09/02/2013   HDL 54 09/02/2013   LDLCALC 33 09/02/2013   TRIG 146 09/02/2013   CHOLHDL 2.1 09/02/2013  He is not on a statin. - last eye exam was in 08/22/2013. No DR.  - no numbness and tingling in his feet.  Pt has FH of DM in father  (borderline).  ROS: Constitutional: no weight gain/loss, no fatigue, no subjective hyperthermia/hypothermia Eyes: no blurry vision, no xerophthalmia ENT: no sore throat, no nodules palpated in throat, no dysphagia/odynophagia, no hoarseness Cardiovascular: no CP/SOB/palpitations/leg swelling Respiratory: no cough/SOB Gastrointestinal: no N/V/D/C Musculoskeletal: no muscle/joint aches Skin: no rashes Neurological: no tremors/numbness/tingling/dizziness Psychiatric: no depression/anxiety  Past Medical History  Diagnosis Date  . Hypertension   . Hyperlipidemia   . Diabetes mellitus without complication   . Hypogonadism male   . Right bundle branch block   . Obstructive sleep apnea   . Kidney stones    Past Surgical History  Procedure Laterality Date  . Ankle surgery  2002  . Rhinoplasty  2001   History   Social History  . Marital Status: Married    Spouse Name: N/A    Number of Children: 3   Occupational History  . [employer] social research officer   Social History Main Topics  . Smoking status: Never Smoker   . Smokeless tobacco: Not on file  . Alcohol Use: Yes  . Drug Use: No   Current Outpatient Prescriptions on File Prior to Visit  Medication Sig Dispense Refill  . aspirin EC 81 MG tablet Take 81  mg by mouth daily.      . Cholecalciferol (VITAMIN D-3) 5000 UNITS TABS Take 5,000 Units by mouth daily.      . INVOKANA 300 MG TABS TAKE ONE TABLET BY MOUTH ONE TIME DAILY for diabetes  30 tablet  2  . Liraglutide (VICTOZA) 18 MG/3ML SOPN INJECT 1.8 MG DAILY  9 mL  4  . lisinopril (PRINIVIL,ZESTRIL) 20 MG tablet Take 10 mg by mouth daily.      . metFORMIN (GLUCOPHAGE-XR) 500 MG 24 hr tablet TAKE FOUR TABLETS BY MOUTH DAILY   120 tablet  5  . Syringe, Disposable, 3 ML MISC 3 cc with 21 gauge 1 inch needle  100 each  1  . terbinafine (LAMISIL) 250 MG tablet Take 1 tablet (250 mg total) by mouth daily.  90 tablet  0  . testosterone cypionate (DEPOTESTOTERONE CYPIONATE) 200 MG/ML injection  Inject 1.5 mLs (300 mg total) into the muscle every 14 (fourteen) days.  10 mL  1  . HYDROcodone-acetaminophen (NORCO) 5-325 MG per tablet Take 1 tablet by mouth every 6 (six) hours as needed for moderate pain.  60 tablet  0   No current facility-administered medications on file prior to visit.   No Known Allergies Family History  Problem Relation Age of Onset  . Hypertension Mother   . Cancer Mother     breast cancer  . Diabetes Father    PE: BP 112/68  Pulse 93  Temp(Src) 97.8 F (36.6 C) (Oral)  Resp 12  Ht 6' (1.829 m)  Wt 284 lb (128.822 kg)  BMI 38.51 kg/m2  SpO2 96% Wt Readings from Last 3 Encounters:  09/10/13 284 lb (128.822 kg)  09/02/13 279 lb (126.554 kg)  08/15/13 280 lb (127.007 kg)   Constitutional: overweight, in NAD Eyes: PERRLA, EOMI, no exophthalmos ENT: moist mucous membranes, no thyromegaly, no cervical lymphadenopathy Cardiovascular: RRR, No MRG Respiratory: CTA B Gastrointestinal: abdomen soft, NT, ND, BS+ Musculoskeletal: no deformities, strength intact in all 4 Skin: moist, warm, no rashes Neurological: no tremor with outstretched hands, DTR normal in all 4  ASSESSMENT: 1. DM2, non-insulin-dependent, uncontrolled, without complications  PLAN:  1. Patient with long-standing, recently more uncontrolled diabetes, on oral antidiabetic regimen, which became insufficient - We discussed about options for treatment, and I suggested to:  Patient Instructions  Please continue: - Metformin XR 1000 mg 2x a day - Victoza 1.8 mg daily - Invokana 300 mg daily Please return in 1 month with your sugar log.  Check sugars 2x a day, rotating checks. - the fact that he was off Invokana for 6 weeks could have increase the A1c... He is on a good diabetes regimen >> encouraged him to work on his diet and compliance with meds and I will see him back in 1 mo with a sugar log >> will decide if we need to add to the regimen or now - Strongly advised him to start  checking sugars at different times of the day - check 2 times a day, rotating checks - given sugar log and advised how to fill it and to bring it at next appt  - given foot care handout and explained the principles  - given instructions for hypoglycemia management "15-15 rule"  - advised for yearly eye exams - Return to clinic in 1 mo with sugar log

## 2013-09-22 ENCOUNTER — Other Ambulatory Visit: Payer: Self-pay | Admitting: Physician Assistant

## 2013-09-24 ENCOUNTER — Other Ambulatory Visit: Payer: Self-pay | Admitting: Physician Assistant

## 2013-09-24 MED ORDER — LISINOPRIL 20 MG PO TABS: 20.0000 mg | ORAL_TABLET | Freq: Every day | ORAL | Status: DC

## 2013-10-11 ENCOUNTER — Ambulatory Visit: Payer: BC Managed Care – PPO | Admitting: Internal Medicine

## 2013-10-14 ENCOUNTER — Other Ambulatory Visit (INDEPENDENT_AMBULATORY_CARE_PROVIDER_SITE_OTHER): Payer: BC Managed Care – PPO

## 2013-10-14 DIAGNOSIS — Z79899 Other long term (current) drug therapy: Secondary | ICD-10-CM

## 2013-10-14 LAB — HEPATIC FUNCTION PANEL
ALBUMIN: 4.4 g/dL (ref 3.5–5.2)
ALT: 24 U/L (ref 0–53)
AST: 16 U/L (ref 0–37)
Alkaline Phosphatase: 95 U/L (ref 39–117)
Bilirubin, Direct: 0.2 mg/dL (ref 0.0–0.3)
Indirect Bilirubin: 1 mg/dL (ref 0.2–1.2)
TOTAL PROTEIN: 6.9 g/dL (ref 6.0–8.3)
Total Bilirubin: 1.2 mg/dL (ref 0.2–1.2)

## 2013-10-23 ENCOUNTER — Encounter: Payer: BC Managed Care – PPO | Admitting: Gastroenterology

## 2013-10-24 ENCOUNTER — Ambulatory Visit: Payer: BC Managed Care – PPO | Admitting: Internal Medicine

## 2013-11-18 ENCOUNTER — Encounter: Payer: Self-pay | Admitting: Internal Medicine

## 2013-11-18 ENCOUNTER — Ambulatory Visit (INDEPENDENT_AMBULATORY_CARE_PROVIDER_SITE_OTHER): Payer: BC Managed Care – PPO | Admitting: Internal Medicine

## 2013-11-18 VITALS — BP 112/68 | HR 79 | Temp 98.3°F | Resp 12 | Wt 292.0 lb

## 2013-11-18 DIAGNOSIS — E119 Type 2 diabetes mellitus without complications: Secondary | ICD-10-CM

## 2013-11-18 NOTE — Patient Instructions (Signed)
Continue Metformin XR1000 mg 2x a day. Continue  Victoza 1.8 mg am Continue Invokana 300 mg in am   Please come back for a follow-up appointment in 3 months with your sugar log.  Please ask Dr Melford Aase to send me the HbA1c result at next visit.

## 2013-11-18 NOTE — Progress Notes (Signed)
Patient ID: Carlos [name], male   DOB: 1961/01/10, 53 y.o.   MRN: [id number]  HPI: Carlos [name] is a 53 y.o.-year-old male, returning for f/u for DM2, dx 2005, non-insulin-dependent, uncontrolled, without complications. Last visit 2 mo ago.   Last hemoglobin A1c was: Lab Results  Component Value Date   HGBA1C 10.2* 09/02/2013   HGBA1C 7.4* 04/22/2013   Pt is on a regimen of: - Metformin XR1000 mg po bid - Victoza 1.8 mg am - started 2 years ago - Invokana 300 mg in am  He was on Actos in the past.  At last visit, we continued this regimen as he had been off Invokana for a period of few weeks before the A1c was drawn.  Pt checks his sugars once a day and they are much better after starting to take the Invokana consistently: - am: 94-244 >> 109-142 - 2h after b'fast: n/c - before lunch: n/c >> 119, 121, 136 - 2h after lunch: n/c >> 126, 140, 141 - before dinner: n/c - 2h after dinner: n/c - bedtime: n/c - nighttime: n/c No lows. Lowest sugar was 109; ? hypoglycemia awareness. Highest sugar was 142.  Pt's meals are: - Breakfast: bacon or sausage, eggs + cheese on wheat; omelet; fruit maybe - Lunch: [place], oriental, club sandwich - usually eats out - Dinner: meat + veggies + brown rice - Snacks: 1 or 2 - fruit  He exercises 3-4x a week: at home or at the gym: treadmill, bicycle, yardwork.  - Has CKD, last BUN/creatinine:  Lab Results  Component Value Date   BUN 18 09/02/2013   CREATININE 1.30 09/02/2013  He started Lisinopril ~1 year ago (stopped it for 4 months before the last check):  Component     Latest Ref Rng 09/02/2013  Microalb, Ur     0.00 - 1.89 mg/dL 9.63 (H)  Creatinine, Urine      127.1  MICROALB/CREAT RATIO     0.0 - 30.0 mg/g 75.8 (H)   - last set of lipids: Lab Results  Component Value Date   CHOL 116 09/02/2013   HDL 54 09/02/2013   LDLCALC 33 09/02/2013   TRIG 146 09/02/2013   CHOLHDL 2.1 09/02/2013  He is not on a statin. - last eye exam was in 08/22/2013.  No DR.  - no numbness and tingling in his feet.  I reviewed pt's medications, allergies, PMH, social hx, family hx and no changes required, except as mentioned above.  ROS: Constitutional: no weight gain/loss, no fatigue, no subjective hyperthermia/hypothermia Eyes: no blurry vision, no xerophthalmia ENT: no sore throat, no nodules palpated in throat, no dysphagia/odynophagia, no hoarseness Cardiovascular: no CP/SOB/palpitations/leg swelling Respiratory: no cough/SOB Gastrointestinal: no N/V/D/C Musculoskeletal: no muscle/joint aches Skin: no rashes Neurological: no tremors/numbness/tingling/dizziness  PE: BP 112/68  Pulse 79  Temp(Src) 98.3 F (36.8 C) (Oral)  Resp 12  Wt 292 lb (132.45 kg)  SpO2 95% Wt Readings from Last 3 Encounters:  11/18/13 292 lb (132.45 kg)  09/10/13 284 lb (128.822 kg)  09/02/13 279 lb (126.554 kg)   Constitutional: overweight, in NAD Eyes: PERRLA, EOMI, no exophthalmos ENT: moist mucous membranes, no thyromegaly, no cervical lymphadenopathy Cardiovascular: RRR, No MRG Respiratory: CTA B Gastrointestinal: abdomen soft, NT, ND, BS+ Musculoskeletal: no deformities, strength intact in all 4 Skin: moist, warm, no rashes Neurological: no tremor with outstretched hands, DTR normal in all 4  ASSESSMENT: 1. DM2, non-insulin-dependent, uncontrolled, without complications  PLAN:  1. Patient with long-standing, now more  ontrolled diabetes, on oral antidiabetic regimen, with improved control after starting Invokana. - We discussed about options for treatment, and I suggested to:  Patient Instructions  Continue Metformin XR1000 mg 2x a day. Continue  Victoza 1.8 mg am Continue Invokana 300 mg in am  Please come back for a follow-up appointment in 3 months with your sugar log. Please ask Dr Melford Aase to send me the HbA1c result at next visit. - we discussed about the need to watch his diet  - Strongly advised him to start checking sugars later in the day,  too - check 2 times a day, rotating checks - up to date with eye exams - he will get his HbA1c in PCP's office at next visit with him - not yet 3 mo since prior - Return to clinic in 3 mo with sugar log

## 2013-11-23 ENCOUNTER — Other Ambulatory Visit: Payer: Self-pay | Admitting: Physician Assistant

## 2013-12-04 ENCOUNTER — Ambulatory Visit: Payer: Self-pay | Admitting: Physician Assistant

## 2013-12-08 ENCOUNTER — Other Ambulatory Visit: Payer: Self-pay | Admitting: Physician Assistant

## 2013-12-23 ENCOUNTER — Other Ambulatory Visit: Payer: Self-pay | Admitting: Internal Medicine

## 2013-12-23 DIAGNOSIS — E119 Type 2 diabetes mellitus without complications: Secondary | ICD-10-CM

## 2013-12-24 ENCOUNTER — Other Ambulatory Visit: Payer: Self-pay | Admitting: Physician Assistant

## 2014-01-20 ENCOUNTER — Encounter: Payer: Self-pay | Admitting: Physician Assistant

## 2014-01-20 ENCOUNTER — Ambulatory Visit (INDEPENDENT_AMBULATORY_CARE_PROVIDER_SITE_OTHER): Payer: BC Managed Care – PPO | Admitting: Physician Assistant

## 2014-01-20 VITALS — BP 128/80 | HR 76 | Temp 98.6°F | Resp 16 | Ht 73.5 in | Wt 295.0 lb

## 2014-01-20 DIAGNOSIS — E559 Vitamin D deficiency, unspecified: Secondary | ICD-10-CM

## 2014-01-20 DIAGNOSIS — E119 Type 2 diabetes mellitus without complications: Secondary | ICD-10-CM

## 2014-01-20 DIAGNOSIS — E782 Mixed hyperlipidemia: Secondary | ICD-10-CM

## 2014-01-20 DIAGNOSIS — E291 Testicular hypofunction: Secondary | ICD-10-CM

## 2014-01-20 DIAGNOSIS — Z79899 Other long term (current) drug therapy: Secondary | ICD-10-CM

## 2014-01-20 DIAGNOSIS — I10 Essential (primary) hypertension: Secondary | ICD-10-CM

## 2014-01-20 NOTE — Progress Notes (Signed)
Assessment and Plan:  Hypertension: Continue medication, monitor blood pressure at home. Continue DASH diet. Cholesterol: Continue diet and exercise. Check cholesterol.  Diabetes-Continue diet and exercise. Check A1C Vitamin D Def- check level and continue medications.   Continue diet and meds as discussed. Further disposition pending results of labs. Discussed med's effects and SE's.    HPI 53 y.o. male  presents for 3 month follow up with hypertension, hyperlipidemia, diabetes and vitamin D. His blood pressure has been controlled at home, today their BP is BP: 128/80 mmHg He does workout, he is in the gym 3 days a week and outside a lot. He denies chest pain, shortness of breath, dizziness.  He is not on cholesterol medication and denies myalgias. His cholesterol is at goal. The cholesterol last visit was:   Lab Results  Component Value Date   CHOL 116 09/02/2013   HDL 54 09/02/2013   LDLCALC 33 09/02/2013   TRIG 146 09/02/2013   CHOLHDL 2.1 09/02/2013   He has been working on diet and exercise for Diabetes, he is on an ACE, he is now on Invokana, Victoza, and metformin, he is seeing endocrinology, and denies paresthesia of the feet, polydipsia and polyuria. Last A1C in the office was:  Lab Results  Component Value Date   HGBA1C 10.2* 09/02/2013   Patient is on Vitamin D supplement. Lab Results  Component Value Date   VD25OH 63 09/02/2013     He is has a history of testosterone deficiency and is on testosterone replacement, last shot was this AM. He states that the testosterone helps with his energy, libido, muscle mass. Lab Results  Component Value Date   TESTOSTERONE 166* 09/02/2013    Current Medications:  Current Outpatient Prescriptions on File Prior to Visit  Medication Sig Dispense Refill  . aspirin EC 81 MG tablet Take 81 mg by mouth daily.      . Cholecalciferol (VITAMIN D-3) 5000 UNITS TABS Take 5,000 Units by mouth daily.      Marland Kitchen HYDROcodone-acetaminophen (NORCO) 5-325 MG per  tablet Take 1 tablet by mouth every 6 (six) hours as needed for moderate pain.  60 tablet  0  . INVOKANA 300 MG TABS TAKE ONE TABLET BY MOUTH ONE TIME DAILY   30 tablet  3  . Liraglutide (VICTOZA) 18 MG/3ML SOPN INJECT 1.8 MG DAILY  9 mL  4  . lisinopril (PRINIVIL,ZESTRIL) 20 MG tablet Take 1 tablet (20 mg total) by mouth daily.  30 tablet  2  . metFORMIN (GLUCOPHAGE-XR) 500 MG 24 hr tablet TAKE FOUR TABLETS BY MOUTH DAILY   120 tablet  5  . Syringe, Disposable, 3 ML MISC 3 cc with 21 gauge 1 inch needle  100 each  1  . terbinafine (LAMISIL) 250 MG tablet Take 1 tablet (250 mg total) by mouth daily.  30 tablet  0  . testosterone cypionate (DEPOTESTOTERONE CYPIONATE) 200 MG/ML injection Inject 1.5 mLs (300 mg total) into the muscle every 14 (fourteen) days.  10 mL  1   No current facility-administered medications on file prior to visit.   Medical History:  Past Medical History  Diagnosis Date  . Hypertension   . Hyperlipidemia   . Diabetes mellitus without complication   . Hypogonadism male   . Right bundle branch block   . Obstructive sleep apnea   . Kidney stones    Allergies: No Known Allergies   Review of Systems: [X]  = complains of  [ ]  = denies  General: Fatigue [ ]   Fever [ ]  Chills [ ]  Weakness [ ]   Insomnia [ ]  Eyes: Redness [ ]  Blurred vision [ ]  Diplopia [ ]   ENT: Congestion [ ]  Sinus Pain [ ]  Post Nasal Drip [ ]  Sore Throat [ ]  Earache [ ]   Cardiac: Chest pain/pressure [ ]  SOB [ ]  Orthopnea [ ]   Palpitations [ ]   Paroxysmal nocturnal dyspnea[ ]  Claudication [ ]  Edema [ ]   Pulmonary: Cough [ ]  Wheezing[ ]   SOB [ ]   Snoring [ ]   GI: Nausea [ ]  Vomiting[ ]  Dysphagia[ ]  Heartburn[ ]  Abdominal pain [ ]  Constipation [ ] ; Diarrhea [ ] ; BRBPR [ ]  Melena[ ]  GU: Hematuria[ ]  Dysuria [ ]  Nocturia[ ]  Urgency [ ]   Hesitancy [ ]  Discharge [ ]  Neuro: Headaches[ ]  Vertigo[ ]  Paresthesias[ ]  Spasm [ ]  Speech changes [ ]  Incoordination [ ]   Ortho: Arthritis [ ]  Joint pain [ ]  Muscle pain [  ] Joint swelling [ ]  Back Pain [ ]  Skin:  Rash [ ]   Pruritis [ ]  Change in skin lesion [ ]   Psych: Depression[ ]  Anxiety[ ]  Confusion [ ]  Memory loss [ ]   Heme/Lypmh: Bleeding [ ]  Bruising [ ]  Enlarged lymph nodes [ ]   Endocrine: Visual blurring [ ]  Paresthesia [ ]  Polyuria [ ]  Polydypsea [ ]    Heat/cold intolerance [ ]  Hypoglycemia [ ]   Family history- Review and unchanged Social history- Review and unchanged Physical Exam: BP 128/80  Pulse 76  Temp(Src) 98.6 F (37 C)  Resp 16  Ht 6' 1.5" (1.867 m)  Wt 295 lb (133.811 kg)  BMI 38.39 kg/m2 Wt Readings from Last 3 Encounters:  01/20/14 295 lb (133.811 kg)  11/18/13 292 lb (132.45 kg)  09/10/13 284 lb (128.822 kg)   General Appearance: Well nourished, in no apparent distress. Eyes: PERRLA, EOMs, conjunctiva no swelling or erythema Sinuses: No Frontal/maxillary tenderness ENT/Mouth: Ext aud canals clear, TMs without erythema, bulging. No erythema, swelling, or exudate on post pharynx.  Tonsils not swollen or erythematous. Hearing normal.  Neck: Supple, thyroid normal.  Respiratory: Respiratory effort normal, BS equal bilaterally without rales, rhonchi, wheezing or stridor.  Cardio: RRR with no MRGs. Brisk peripheral pulses without edema.  Abdomen: Soft, + BS.  Non tender, no guarding, rebound, hernias, masses. Lymphatics: Non tender without lymphadenopathy.  Musculoskeletal: Full ROM, 5/5 strength, normal gait.  Skin: Warm, dry without rashes, lesions, ecchymosis.  Neuro: Cranial nerves intact. No cerebellar symptoms. Sensation intact.  Psych: Awake and oriented X 3, normal affect, Insight and Judgment appropriate.    Vicie Mutters 4:52 PM

## 2014-01-20 NOTE — Patient Instructions (Signed)

## 2014-01-21 LAB — BASIC METABOLIC PANEL WITH GFR
BUN: 20 mg/dL (ref 6–23)
CALCIUM: 10.1 mg/dL (ref 8.4–10.5)
CO2: 22 meq/L (ref 19–32)
CREATININE: 1.48 mg/dL — AB (ref 0.50–1.35)
Chloride: 104 mEq/L (ref 96–112)
GFR, EST NON AFRICAN AMERICAN: 54 mL/min — AB
GFR, Est African American: 63 mL/min
Glucose, Bld: 111 mg/dL — ABNORMAL HIGH (ref 70–99)
Potassium: 4.6 mEq/L (ref 3.5–5.3)
Sodium: 141 mEq/L (ref 135–145)

## 2014-01-21 LAB — LIPID PANEL
Cholesterol: 140 mg/dL (ref 0–200)
HDL: 51 mg/dL (ref >39)
LDL Cholesterol: 57 mg/dL (ref 0–99)
TRIGLYCERIDES: 160 mg/dL — AB (ref <150)
Total CHOL/HDL Ratio: 2.7 Ratio
VLDL: 32 mg/dL (ref 0–40)

## 2014-01-21 LAB — CBC WITH DIFFERENTIAL/PLATELET
Basophils Absolute: 0 10*3/uL (ref 0.0–0.1)
Basophils Relative: 1 % (ref 0–1)
EOS ABS: 0.1 10*3/uL (ref 0.0–0.7)
EOS PCT: 3 % (ref 0–5)
HCT: 54.1 % — ABNORMAL HIGH (ref 39.0–52.0)
Hemoglobin: 18.7 g/dL — ABNORMAL HIGH (ref 13.0–17.0)
LYMPHS ABS: 2.9 10*3/uL (ref 0.7–4.0)
LYMPHS PCT: 59 % — AB (ref 12–46)
MCH: 29.3 pg (ref 26.0–34.0)
MCHC: 34.6 g/dL (ref 30.0–36.0)
MCV: 84.7 fL (ref 78.0–100.0)
Monocytes Absolute: 0.3 10*3/uL (ref 0.1–1.0)
Monocytes Relative: 6 % (ref 3–12)
NEUTROS PCT: 31 % — AB (ref 43–77)
Neutro Abs: 1.5 10*3/uL — ABNORMAL LOW (ref 1.7–7.7)
Platelets: 205 10*3/uL (ref 150–400)
RBC: 6.39 MIL/uL — AB (ref 4.22–5.81)
RDW: 13.6 % (ref 11.5–15.5)
WBC: 4.9 10*3/uL (ref 4.0–10.5)

## 2014-01-21 LAB — INSULIN, FASTING: Insulin fasting, serum: 10.8 u[IU]/mL (ref 2.0–19.6)

## 2014-01-21 LAB — HEPATIC FUNCTION PANEL
ALK PHOS: 106 U/L (ref 39–117)
ALT: 38 U/L (ref 0–53)
AST: 24 U/L (ref 0–37)
Albumin: 4.7 g/dL (ref 3.5–5.2)
BILIRUBIN DIRECT: 0.2 mg/dL (ref 0.0–0.3)
BILIRUBIN INDIRECT: 0.9 mg/dL (ref 0.2–1.2)
TOTAL PROTEIN: 7.4 g/dL (ref 6.0–8.3)
Total Bilirubin: 1.1 mg/dL (ref 0.2–1.2)

## 2014-01-21 LAB — VITAMIN D 25 HYDROXY (VIT D DEFICIENCY, FRACTURES): Vit D, 25-Hydroxy: 51 ng/mL (ref 30–89)

## 2014-01-21 LAB — MAGNESIUM: Magnesium: 2.1 mg/dL (ref 1.5–2.5)

## 2014-01-21 LAB — HEMOGLOBIN A1C
Hgb A1c MFr Bld: 7 % — ABNORMAL HIGH (ref <5.7)
Mean Plasma Glucose: 154 mg/dL — ABNORMAL HIGH (ref <117)

## 2014-01-21 LAB — TSH: TSH: 1.389 u[IU]/mL (ref 0.350–4.500)

## 2014-01-27 ENCOUNTER — Other Ambulatory Visit: Payer: Self-pay | Admitting: Physician Assistant

## 2014-02-03 ENCOUNTER — Other Ambulatory Visit: Payer: Self-pay | Admitting: Physician Assistant

## 2014-02-17 ENCOUNTER — Other Ambulatory Visit: Payer: Self-pay | Admitting: Physician Assistant

## 2014-02-17 ENCOUNTER — Ambulatory Visit (INDEPENDENT_AMBULATORY_CARE_PROVIDER_SITE_OTHER): Payer: BC Managed Care – PPO | Admitting: Internal Medicine

## 2014-02-17 ENCOUNTER — Encounter: Payer: Self-pay | Admitting: Internal Medicine

## 2014-02-17 VITALS — BP 122/80 | HR 88 | Temp 98.1°F | Resp 16 | Ht 73.5 in | Wt 297.0 lb

## 2014-02-17 DIAGNOSIS — Z23 Encounter for immunization: Secondary | ICD-10-CM

## 2014-02-17 DIAGNOSIS — M25552 Pain in left hip: Secondary | ICD-10-CM

## 2014-02-17 MED ORDER — TRAMADOL HCL 50 MG PO TABS: ORAL_TABLET | ORAL | Status: DC

## 2014-02-17 MED ORDER — PREDNISONE 20 MG PO TABS: ORAL_TABLET | ORAL | Status: DC

## 2014-02-17 NOTE — Progress Notes (Signed)
   Subjective:    Patient ID: Carlos [name], male    DOB: June 01, 1960, 53 y.o.   MRN: [id number]  HPI  A very nice 53 yo MBM patient who presents with 2-3 month hx/o Left hip pains exacerbated by activity.  No known injury.  No sciatica type pains. Has not tried any meds for same.   Medication List   aspirin EC 81 MG tablet  Take 81 mg by mouth daily.     INVOKANA 300 MG Tabs  TAKE ONE TABLET BY MOUTH ONE TIME DAILY     Liraglutide 18 MG/3ML Sopn  INJECT 1.8 MG DAILY     lisinopril 20 MG tablet  Take 1 tablet (20 mg total) by mouth daily.     metFORMIN 500 MG XR 24 hr tablet  TAKE FOUR TABLETS BY MOUTH DAILY     testosterone cypionate 200 MG/ML injection  inject 1.55ml im every 2 weeks as directed     Vitamin D-3 5000 UNITS Tabs  Take 5,000 Units by mouth daily.     No Known Allergies  Past Medical History  Diagnosis Date  . Hypertension   . Hyperlipidemia   . Diabetes mellitus without complication   . Hypogonadism male   . Right bundle branch block   . Obstructive sleep apnea   . Kidney stones    Review of Systems In addition to the HPI above,  No Fever-chills,  No Headache, No changes with Vision or hearing,  No problems swallowing food or Liquids,  No Chest pain or productive Cough or Shortness of Breath,  No Abdominal pain, No Nausea or Vomitting, Bowel movements are regular,  No Blood in stool or Urine,  No dysuria,  No new skin rashes or bruises,  No new weakness, tingling, numbness in any extremity,  No recent weight loss,  No polyuria, polydypsia or polyphagia,  No significant Mental Stressors.  A full 10 point Review of Systems was done, except as stated above, all other Review of Systems were negative  Objective:   Physical Exam               BP 122/80  Pulse 88  Temp 98.1 F   Resp 16  Ht 6' 1.5"   Wt 297 lb   BMI 38.65   HEENT - Eac's patent. TM's Nl. EOM's full. PERRLA. NasoOroPharynx clear. Neck - supple. Nl Thyroid. Carotids 2+ & No  bruits, nodes, JVD Chest - Clear equal BS w/o Rales, rhonchi, wheezes. Cor - Nl HS. RRR w/o sig MGR. PP 1(+). No edema. Abd - No palpable organomegaly, masses or tenderness. BS nl. MS- FROM w/o deformities.  Neg SLR Left - No Bursal tenderness. Sl to moderate pain Left hip with internal/external rotation. Muscle power, tone and bulk Nl. Gait Nl. Neuro - No obvious Cr N abnormalities. Sensory, motor and Cerebellar functions appear Nl w/o focal abnormalities. Psyche - Mental status normal & appropriate.  No delusions, ideations or obvious mood abnormalities.  Assessment & Plan:   1. Left hip pain (consider Aseptic Necrosis of hip in a Diabetic Patient)  - Rx Prednisone with hyperglycemia precautions - Rx Tramadol 50 MG  - Recc Xray of Bilat Hip/pelvis.  2. Need for prophylactic vaccination and inoculation against influenza  - Flu vaccine greater than or equal to 3yo with preservative IM

## 2014-02-18 ENCOUNTER — Telehealth: Payer: Self-pay | Admitting: Internal Medicine

## 2014-02-18 ENCOUNTER — Ambulatory Visit: Payer: BC Managed Care – PPO | Admitting: Internal Medicine

## 2014-02-18 DIAGNOSIS — Z0289 Encounter for other administrative examinations: Secondary | ICD-10-CM

## 2014-02-18 NOTE — Telephone Encounter (Signed)
Patient no showed today's appt. Please advise on how to follow up. °A. No follow up necessary. °B. Follow up urgent. Contact patient immediately. °C. Follow up necessary. Contact patient and schedule visit in ___ days. °D. Follow up advised. Contact patient and schedule visit in ____weeks. ° °

## 2014-02-18 NOTE — Telephone Encounter (Signed)
1-1.5 mo

## 2014-02-18 NOTE — Telephone Encounter (Signed)
Please advise 

## 2014-02-20 ENCOUNTER — Other Ambulatory Visit: Payer: Self-pay | Admitting: Internal Medicine

## 2014-02-20 ENCOUNTER — Ambulatory Visit (HOSPITAL_COMMUNITY)
Admission: RE | Admit: 2014-02-20 | Discharge: 2014-02-20 | Disposition: A | Discharge status: Home / Self Care | Payer: BC Managed Care – PPO | Source: Ambulatory Visit | Attending: Internal Medicine | Admitting: Internal Medicine

## 2014-02-20 DIAGNOSIS — M25552 Pain in left hip: Secondary | ICD-10-CM | POA: Diagnosis not present

## 2014-02-20 DIAGNOSIS — M1611 Unilateral primary osteoarthritis, right hip: Secondary | ICD-10-CM | POA: Diagnosis not present

## 2014-02-20 DIAGNOSIS — R52 Pain, unspecified: Secondary | ICD-10-CM

## 2014-02-20 DIAGNOSIS — M25551 Pain in right hip: Secondary | ICD-10-CM | POA: Insufficient documentation

## 2014-04-12 ENCOUNTER — Other Ambulatory Visit: Payer: Self-pay | Admitting: Physician Assistant

## 2014-04-16 ENCOUNTER — Encounter: Payer: Self-pay | Admitting: Internal Medicine

## 2014-04-23 ENCOUNTER — Ambulatory Visit: Payer: Self-pay | Admitting: Physician Assistant

## 2014-04-28 ENCOUNTER — Encounter: Payer: Self-pay | Admitting: Internal Medicine

## 2014-05-06 ENCOUNTER — Encounter (HOSPITAL_COMMUNITY): Payer: Self-pay

## 2014-05-06 ENCOUNTER — Emergency Department (HOSPITAL_COMMUNITY): Payer: BLUE CROSS/BLUE SHIELD

## 2014-05-06 ENCOUNTER — Emergency Department (HOSPITAL_COMMUNITY)
Admission: EM | Admit: 2014-05-06 | Discharge: 2014-05-06 | Disposition: A | Discharge status: Home / Self Care | Payer: BLUE CROSS/BLUE SHIELD | Attending: Emergency Medicine | Admitting: Emergency Medicine

## 2014-05-06 DIAGNOSIS — I10 Essential (primary) hypertension: Secondary | ICD-10-CM | POA: Insufficient documentation

## 2014-05-06 DIAGNOSIS — N200 Calculus of kidney: Secondary | ICD-10-CM | POA: Insufficient documentation

## 2014-05-06 DIAGNOSIS — E785 Hyperlipidemia, unspecified: Secondary | ICD-10-CM | POA: Diagnosis not present

## 2014-05-06 DIAGNOSIS — Z79899 Other long term (current) drug therapy: Secondary | ICD-10-CM | POA: Diagnosis not present

## 2014-05-06 DIAGNOSIS — E119 Type 2 diabetes mellitus without complications: Secondary | ICD-10-CM | POA: Insufficient documentation

## 2014-05-06 DIAGNOSIS — R109 Unspecified abdominal pain: Secondary | ICD-10-CM

## 2014-05-06 DIAGNOSIS — Z7982 Long term (current) use of aspirin: Secondary | ICD-10-CM | POA: Diagnosis not present

## 2014-05-06 DIAGNOSIS — Z8669 Personal history of other diseases of the nervous system and sense organs: Secondary | ICD-10-CM | POA: Diagnosis not present

## 2014-05-06 LAB — CBC WITH DIFFERENTIAL/PLATELET
BASOS ABS: 0 10*3/uL (ref 0.0–0.1)
Basophils Relative: 0 % (ref 0–1)
EOS ABS: 0.2 10*3/uL (ref 0.0–0.7)
EOS PCT: 3 % (ref 0–5)
HCT: 55.1 % — ABNORMAL HIGH (ref 39.0–52.0)
Hemoglobin: 18 g/dL — ABNORMAL HIGH (ref 13.0–17.0)
Lymphocytes Relative: 34 % (ref 12–46)
Lymphs Abs: 2.3 10*3/uL (ref 0.7–4.0)
MCH: 29.2 pg (ref 26.0–34.0)
MCHC: 32.7 g/dL (ref 30.0–36.0)
MCV: 89.4 fL (ref 78.0–100.0)
Monocytes Absolute: 0.5 10*3/uL (ref 0.1–1.0)
Monocytes Relative: 8 % (ref 3–12)
Neutro Abs: 3.7 10*3/uL (ref 1.7–7.7)
Neutrophils Relative %: 56 % (ref 43–77)
PLATELETS: 237 10*3/uL (ref 150–400)
RBC: 6.16 MIL/uL — ABNORMAL HIGH (ref 4.22–5.81)
RDW: 13.1 % (ref 11.5–15.5)
WBC: 6.7 10*3/uL (ref 4.0–10.5)

## 2014-05-06 LAB — BASIC METABOLIC PANEL
Anion gap: 10 (ref 5–15)
BUN: 31 mg/dL — ABNORMAL HIGH (ref 6–23)
CHLORIDE: 105 meq/L (ref 96–112)
CO2: 26 mmol/L (ref 19–32)
CREATININE: 2.63 mg/dL — AB (ref 0.50–1.35)
Calcium: 9.7 mg/dL (ref 8.4–10.5)
GFR calc Af Amer: 31 mL/min — ABNORMAL LOW (ref >90)
GFR calc non Af Amer: 27 mL/min — ABNORMAL LOW (ref >90)
GLUCOSE: 107 mg/dL — AB (ref 70–99)
POTASSIUM: 4.7 mmol/L (ref 3.5–5.1)
SODIUM: 141 mmol/L (ref 135–145)

## 2014-05-06 LAB — URINALYSIS, ROUTINE W REFLEX MICROSCOPIC
Bilirubin Urine: NEGATIVE
Glucose, UA: >1000 mg/dL — AB
HGB URINE DIPSTICK: NEGATIVE
Ketones, ur: NEGATIVE mg/dL
LEUKOCYTES UA: NEGATIVE
Nitrite: NEGATIVE
Protein, ur: NEGATIVE mg/dL
SPECIFIC GRAVITY, URINE: 1.028 (ref 1.005–1.030)
Urobilinogen, UA: 1 mg/dL (ref 0.0–1.0)
pH: 5.5 (ref 5.0–8.0)

## 2014-05-06 LAB — URINE MICROSCOPIC-ADD ON

## 2014-05-06 MED ORDER — ONDANSETRON HCL 4 MG PO TABS: 4.0000 mg | ORAL_TABLET | Freq: Four times a day (QID) | ORAL | Status: DC

## 2014-05-06 MED ORDER — ONDANSETRON HCL 4 MG/2ML IJ SOLN
4.0000 mg | Freq: Once | INTRAMUSCULAR | Status: AC
  Administered 2014-05-06: 4 mg via INTRAVENOUS
  Filled 2014-05-06: qty 2

## 2014-05-06 MED ORDER — HYDROMORPHONE HCL 1 MG/ML IJ SOLN
1.0000 mg | Freq: Once | INTRAMUSCULAR | Status: AC
  Administered 2014-05-06: 1 mg via INTRAVENOUS
  Filled 2014-05-06: qty 1

## 2014-05-06 MED ORDER — SODIUM CHLORIDE 0.9 % IV BOLUS (SEPSIS)
1000.0000 mL | INTRAVENOUS | Status: AC
  Administered 2014-05-06: 1000 mL via INTRAVENOUS

## 2014-05-06 MED ORDER — HYDROCODONE-ACETAMINOPHEN 5-325 MG PO TABS: 1.0000 | ORAL_TABLET | ORAL | Status: DC | PRN

## 2014-05-06 NOTE — ED Provider Notes (Signed)
CSN: [id number]     Arrival date & time 05/06/14  1228 History   First MD Initiated Contact with Patient 05/06/14 1328     Chief Complaint  Patient presents with  . Flank Pain   (Consider location/radiation/quality/duration/timing/severity/associated sxs/prior Treatment) HPI Carlos [name] is a 54 yo male presenting with left flank pain x 4 days.  He reports he first began feeling nauseated last Thursday night.  Later he felt the pain in his left flank and several episodes of vomiting.  The vomiting has stopped but the pain and nausea continues.  He currently ranks the pain as 5/10.  He has had a kidney stone 4 years ago and he reports this feels similar.  He denies fevers, chills, testicular pain, abd pain or dysuria.    Past Medical History  Diagnosis Date  . Hypertension   . Hyperlipidemia   . Diabetes mellitus without complication   . Hypogonadism male   . Right bundle branch block   . Obstructive sleep apnea   . Kidney stones    Past Surgical History  Procedure Laterality Date  . Ankle surgery  2002  . Rhinoplasty  2001   Family History  Problem Relation Age of Onset  . Hypertension Mother   . Cancer Mother     breast cancer  . Diabetes Father    History  Substance Use Topics  . Smoking status: Never Smoker   . Smokeless tobacco: Not on file  . Alcohol Use: Yes    Review of Systems  Constitutional: Negative for fever and chills.  HENT: Negative for sore throat.   Eyes: Negative for visual disturbance.  Respiratory: Negative for cough and shortness of breath.   Cardiovascular: Negative for chest pain and leg swelling.  Gastrointestinal: Positive for nausea and vomiting. Negative for diarrhea.  Genitourinary: Positive for flank pain. Negative for dysuria and decreased urine volume.  Musculoskeletal: Negative for myalgias.  Skin: Negative for rash.  Neurological: Negative for weakness, numbness and headaches.      Allergies  Review of patient's allergies  indicates no known allergies.  Home Medications   Prior to Admission medications   Medication Sig Start Date End Date Taking? Authorizing Provider  aspirin EC 81 MG tablet Take 81 mg by mouth daily.   Yes Historical Provider, MD  Cholecalciferol (VITAMIN D-3) 5000 UNITS TABS Take 5,000 Units by mouth daily.   Yes Historical Provider, MD  ibuprofen (ADVIL,MOTRIN) 200 MG tablet Take 400 mg by mouth every 6 (six) hours as needed for moderate pain.   Yes Historical Provider, MD  INVOKANA 300 MG TABS tablet TAKE ONE TABLET BY MOUTH ONE TIME DAILY  04/12/14  Yes Unk Pinto, MD  lisinopril (PRINIVIL,ZESTRIL) 20 MG tablet TAKE ONE TABLET BY MOUTH ONE TIME DAILY  02/18/14  Yes Vicie Mutters, PA-C  metFORMIN (GLUCOPHAGE-XR) 500 MG 24 hr tablet TAKE FOUR TABLETS BY MOUTH DAILY  02/03/14  Yes Unk Pinto, MD  PRESCRIPTION MEDICATION Take 1 tablet by mouth every 6 (six) hours as needed (pain). "oxycodone"   Yes Historical Provider, MD  Syringe, Disposable, 3 ML MISC 3 cc with 21 gauge 1 inch needle 09/02/13  Yes Vicie Mutters, PA-C  testosterone cypionate (DEPOTESTOTERONE CYPIONATE) 200 MG/ML injection inject 1.40ml im every 2 weeks as directed 01/27/14  Yes Vicie Mutters, PA-C  VICTOZA 18 MG/3ML SOPN inject 1.8mg s daily as directed 02/18/14  Yes Vicie Mutters, PA-C  predniSONE (DELTASONE) 20 MG tablet 1 tab 3 x day for 2 days, then 1  tab 2 x day for 2 days, then 1 tab 1 x day for 3 days 02/17/14   Unk Pinto, MD  traMADol Veatrice Bourbon) 50 MG tablet Take 1 tablet 4 x day if needed for pains Patient not taking: Reported on 05/06/2014 02/17/14   Unk Pinto, MD   BP 138/77 mmHg  Pulse 81  Temp(Src) 98.2 F (36.8 C) (Oral)  Resp 18  SpO2 100% Physical Exam  Constitutional: He appears well-developed and well-nourished. No distress.  HENT:  Head: Normocephalic and atraumatic.  Mouth/Throat: Oropharynx is clear and moist. No oropharyngeal exudate.  Eyes: Conjunctivae are normal.  Neck: Neck  supple. No thyromegaly present.  Cardiovascular: Normal rate, regular rhythm and intact distal pulses.   Pulmonary/Chest: Effort normal and breath sounds normal. No respiratory distress.  Abdominal: Soft. There is no tenderness.    Musculoskeletal: He exhibits no tenderness.  Lymphadenopathy:    He has no cervical adenopathy.  Neurological: He is alert.  Skin: Skin is warm and dry. No rash noted. He is not diaphoretic.  Psychiatric: He has a normal mood and affect.  Nursing note and vitals reviewed.   ED Course  Procedures (including critical care time) Labs Review Labs Reviewed  URINALYSIS, ROUTINE W REFLEX MICROSCOPIC - Abnormal; Notable for the following:    Glucose, UA >1000 (*)    All other components within normal limits  CBC WITH DIFFERENTIAL - Abnormal; Notable for the following:    RBC 6.16 (*)    Hemoglobin 18.0 (*)    HCT 55.1 (*)    All other components within normal limits  BASIC METABOLIC PANEL - Abnormal; Notable for the following:    Glucose, Bld 107 (*)    BUN 31 (*)    Creatinine, Ser 2.63 (*)    GFR calc non Af Amer 27 (*)    GFR calc Af Amer 31 (*)    All other components within normal limits  URINE MICROSCOPIC-ADD ON    Imaging Review Ct Renal Stone Study  05/06/2014   CLINICAL DATA:  54 year old male with left flank pain since Friday. Pain radiating to the left testicle. Initial encounter.  EXAM: CT ABDOMEN AND PELVIS WITHOUT CONTRAST  TECHNIQUE: Multidetector CT imaging of the abdomen and pelvis was performed following the standard protocol without IV contrast.  COMPARISON:  CT Abdomen and Pelvis 10/28/2010.  FINDINGS: No pleural or pericardial effusion.  Negative lung bases.  Lower lumbar facet degeneration. Ankylosis of the right SI joint. No acute osseous abnormality identified.  No pelvic free fluid. Redundant but otherwise negative sigmoid colon and rectum. Negative left colon, transverse colon, right colon and appendix. No dilated small bowel.  Negative non contrast stomach, duodenum, gallbladder, pancreas, and adrenal glands. Negative non contrast liver and spleen with stable small calcified granulomas.  Negative non contrast right kidney and right ureter. Unremarkable bladder.  Left hydronephrosis and hydroureter to the level of an oval obstructing calculus located in the left ureter at the L4 level about 7.5 cm distal to the left ureteropelvic junction. The calculus is 6 x 6 mm. The more distal left ureter has a more normal appearance to the left ureterovesical junction. Left greater than right inferior pelvic phleboliths are unchanged. No free fluid. No other urologic calculus identified.  Mild ventral abdominal wall stranding (series 2, image 51) is nonspecific, query recent subcutaneous injections.  IMPRESSION: Acute obstructive uropathy on the left due to a 6 mm ureteral calculus at the L4 level.   Electronically Signed   By: Truman Hayward  [place] Crane M.D.   On: 05/06/2014 14:45     EKG Interpretation None      MDM   Final diagnoses:  Flank pain  Kidney stone   54 yo with flank pain and nausea and vomiting. CT scan shows 6 mm kidney stone with obstruction on the right but normal appearing right kidney and ureter.  His pain and nausea were relieved in the ED and his vitals are stable and he is afebrile.  His labs are significant for elevated BUN and creatinine; 31 and 2.63 respectively. He denies any difficulty with urination.  Consulted Dr. Tresa Moore (urology) who reviewed scans and recommends pt can follow-up as an outpt but important to discuss strict return precautions of any decrease in urination, fever, chills or other concerning symptoms. Pt is well-appearing, in no acute distress and vital signs are stable.  They appear safe to be discharged.  Discharge include follow-up with urology.  Return precautions discussed and pt aware of plan and in agreement.    Filed Vitals:   05/06/14 1234 05/06/14 1455 05/06/14 1519 05/06/14 1637  BP: 138/77  142/84 136/84 133/74  Pulse: 81 82 74 78  Temp: 98.2 F (36.8 C) 98.4 F (36.9 C)  98.1 F (36.7 C)  TempSrc: Oral Oral  Oral  Resp: 18 16 16 16   SpO2: 100% 98% 93% 95%   Meds given in ED:  Medications  sodium chloride 0.9 % bolus 1,000 mL (0 mLs Intravenous Stopped 05/06/14 1603)  HYDROmorphone (DILAUDID) injection 1 mg (1 mg Intravenous Given 05/06/14 1447)  ondansetron (ZOFRAN) injection 4 mg (4 mg Intravenous Given 05/06/14 1447)    Discharge Medication List as of 05/06/2014  3:47 PM    START taking these medications   Details  HYDROcodone-acetaminophen (NORCO/VICODIN) 5-325 MG per tablet Take 1-2 tablets by mouth every 4 (four) hours as needed for moderate pain or severe pain., Starting 05/06/2014, Until Discontinued, Print    ondansetron (ZOFRAN) 4 MG tablet Take 1 tablet (4 mg total) by mouth every 6 (six) hours., Starting 05/06/2014, Until Discontinued, Print           Oestreich Bottom, NP 05/08/14 0034  Jasper Riling. Alvino Chapel, MD 05/09/14 3220

## 2014-05-06 NOTE — ED Notes (Signed)
Pt with left flank pain since Friday.  No change in urination.  No fever.  No blood noted in urine.  Hx of kidney stones.

## 2014-05-06 NOTE — Discharge Instructions (Signed)
Please follow the directions provided. Be sure to follow-up with the urologist for further management.  Take the pain and nausea medicines as needed.  Don't hesitate to return for any new, worsening or concerning symptoms.    SEEK IMMEDIATE MEDICAL CARE IF:  Pain cannot be controlled with the prescribed medicine.  You have a fever or shaking chills.  The severity or intensity of pain increases over 18 hours and is not relieved by pain medicine.  You develop a new onset of abdominal pain.  You feel faint or pass out.  You are unable to urinate.

## 2014-05-06 NOTE — ED Notes (Signed)
Patient transported to CT 

## 2014-07-09 ENCOUNTER — Encounter: Payer: Self-pay | Admitting: *Deleted

## 2014-07-15 ENCOUNTER — Other Ambulatory Visit: Payer: Self-pay | Admitting: Physician Assistant

## 2014-07-16 ENCOUNTER — Other Ambulatory Visit: Payer: Self-pay | Admitting: Physician Assistant

## 2014-07-28 ENCOUNTER — Encounter: Payer: Self-pay | Admitting: *Deleted

## 2014-07-30 ENCOUNTER — Other Ambulatory Visit: Payer: Self-pay | Admitting: Internal Medicine

## 2014-08-10 ENCOUNTER — Encounter: Payer: Self-pay | Admitting: *Deleted

## 2014-08-18 ENCOUNTER — Other Ambulatory Visit: Payer: Self-pay | Admitting: Urology

## 2014-08-19 ENCOUNTER — Other Ambulatory Visit: Payer: Self-pay | Admitting: Internal Medicine

## 2014-08-19 ENCOUNTER — Other Ambulatory Visit: Payer: Self-pay | Admitting: Physician Assistant

## 2014-08-21 ENCOUNTER — Other Ambulatory Visit: Payer: Self-pay | Admitting: *Deleted

## 2014-08-21 ENCOUNTER — Ambulatory Visit (INDEPENDENT_AMBULATORY_CARE_PROVIDER_SITE_OTHER): Payer: BLUE CROSS/BLUE SHIELD | Admitting: Internal Medicine

## 2014-08-21 ENCOUNTER — Encounter: Payer: Self-pay | Admitting: Internal Medicine

## 2014-08-21 VITALS — BP 124/80 | HR 88 | Temp 97.5°F | Resp 16 | Ht 73.5 in | Wt 293.0 lb

## 2014-08-21 DIAGNOSIS — E119 Type 2 diabetes mellitus without complications: Secondary | ICD-10-CM

## 2014-08-21 DIAGNOSIS — E291 Testicular hypofunction: Secondary | ICD-10-CM

## 2014-08-21 DIAGNOSIS — I10 Essential (primary) hypertension: Secondary | ICD-10-CM

## 2014-08-21 DIAGNOSIS — Z9989 Dependence on other enabling machines and devices: Secondary | ICD-10-CM

## 2014-08-21 DIAGNOSIS — E349 Endocrine disorder, unspecified: Secondary | ICD-10-CM

## 2014-08-21 DIAGNOSIS — E782 Mixed hyperlipidemia: Secondary | ICD-10-CM

## 2014-08-21 DIAGNOSIS — E1122 Type 2 diabetes mellitus with diabetic chronic kidney disease: Secondary | ICD-10-CM

## 2014-08-21 DIAGNOSIS — G4733 Obstructive sleep apnea (adult) (pediatric): Secondary | ICD-10-CM | POA: Insufficient documentation

## 2014-08-21 DIAGNOSIS — Z79899 Other long term (current) drug therapy: Secondary | ICD-10-CM | POA: Insufficient documentation

## 2014-08-21 DIAGNOSIS — E559 Vitamin D deficiency, unspecified: Secondary | ICD-10-CM

## 2014-08-21 LAB — CBC WITH DIFFERENTIAL/PLATELET
Basophils Absolute: 0 10*3/uL (ref 0.0–0.1)
Basophils Relative: 1 % (ref 0–1)
EOS PCT: 5 % (ref 0–5)
Eosinophils Absolute: 0.2 10*3/uL (ref 0.0–0.7)
HEMATOCRIT: 52.9 % — AB (ref 39.0–52.0)
HEMOGLOBIN: 18.6 g/dL — AB (ref 13.0–17.0)
LYMPHS ABS: 2.4 10*3/uL (ref 0.7–4.0)
LYMPHS PCT: 49 % — AB (ref 12–46)
MCH: 29.2 pg (ref 26.0–34.0)
MCHC: 35.2 g/dL (ref 30.0–36.0)
MCV: 83.2 fL (ref 78.0–100.0)
MONO ABS: 0.3 10*3/uL (ref 0.1–1.0)
MONOS PCT: 6 % (ref 3–12)
MPV: 10.9 fL (ref 8.6–12.4)
Neutro Abs: 1.9 10*3/uL (ref 1.7–7.7)
Neutrophils Relative %: 39 % — ABNORMAL LOW (ref 43–77)
PLATELETS: 203 10*3/uL (ref 150–400)
RBC: 6.36 MIL/uL — AB (ref 4.22–5.81)
RDW: 13.4 % (ref 11.5–15.5)
WBC: 4.9 10*3/uL (ref 4.0–10.5)

## 2014-08-21 LAB — HEMOGLOBIN A1C
Hgb A1c MFr Bld: 7.9 % — ABNORMAL HIGH (ref <5.7)
Mean Plasma Glucose: 180 mg/dL — ABNORMAL HIGH (ref <117)

## 2014-08-21 MED ORDER — CANAGLIFLOZIN 300 MG PO TABS: 300.0000 mg | ORAL_TABLET | Freq: Every day | ORAL | Status: DC

## 2014-08-21 MED ORDER — LIRAGLUTIDE 18 MG/3ML ~~LOC~~ SOPN: PEN_INJECTOR | SUBCUTANEOUS | Status: DC

## 2014-08-21 MED ORDER — METFORMIN HCL ER 500 MG PO TB24: 2000.0000 mg | ORAL_TABLET | Freq: Every day | ORAL | Status: DC

## 2014-08-21 MED ORDER — TESTOSTERONE CYPIONATE 200 MG/ML IM SOLN: INTRAMUSCULAR | Status: DC

## 2014-08-21 MED ORDER — LISINOPRIL 20 MG PO TABS: 20.0000 mg | ORAL_TABLET | Freq: Every day | ORAL | Status: DC

## 2014-08-21 NOTE — Patient Instructions (Signed)

## 2014-08-21 NOTE — Progress Notes (Signed)
Patient ID: Carlos [name], male   DOB: 1960-09-02, 54 y.o.   MRN: [id number]   This very nice 54 y.o. MBM presents for 3 month follow up with Hypertension, Hyperlipidemia, T2_NIDDM, OSA/CPAP and Vitamin D Deficiency.    Patient is treated for HTN & BP has been controlled at home. Today's BP: 124/80 mmHg. Patient has had no complaints of any cardiac type chest pain, palpitations, dyspnea/orthopnea/PND, dizziness, claudication, or dependent edema. Continues to feel benefit of more restful sleep & less excessive sleepiness oin CPAP.    Hyperlipidemia is controlled with diet & meds. Patient denies myalgias or other med SE's. Last Lipids were at goal.  Total  Chol 140; HDL 51; LDL  57; Trig 160 on 01/20/2014.   Also, the patient has history of Morbid Obesity (BMI 38+) and consequent T2_NIDDM and has had no symptoms of reactive hypoglycemia, diabetic polys, paresthesias or visual blurring. He reports FBG's in the 130-140's range. 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 Last A1c was not at goal at 7.0% on 01/20/2014.    Further, the patient also has history of Vitamin D Deficiency and supplements vitamin D without any suspected side-effects. Last vitamin D was 51 on    01/20/2014.   Medication Sig  . aspirin EC 81 MG tablet Take 81 mg by mouth daily.  Marland Kitchen VITAMIN D 5000 UNITS TABS Take 5,000 Units by mouth daily.  Marland Kitchen ibuprofen (ADVIL,MOTRIN) 200 MG tablet Take 400 mg by mouth every 6 (six) hours as needed for moderate pain.  . INVOKANA 300 MG TABS tablet TAKE ONE TABLET BY MOUTH ONE TIME DAILY   . lisinopril  20 MG tablet TAKE ONE TABLET BY MOUTH ONE TIME DAILY   . metFORMIN -XR 500 MG 24 hr tablet TAKE FOUR TABLETS BY MOUTH DAILY   .  DEPO-TESTOTERONE  200 MG/ML inj inject 1.9ml im every 2 weeks as directed  . traMADol  50 MG tablet Take 1 tablet 4 x day if needed for pains  . VICTOZA 18 MG/3ML SOPN inject 1.8 mgs daily as  directed  . NORCO 5-325  Take 1-2 tablets by mouth every 4 (four) hours as needed for moderate pain or severe pain.  Marland Kitchen ondansetron (ZOFRAN) 4 MG tablet Take 1 tablet (4 mg total) by mouth every 6 (six) hours.  . predniSONE  20 MG tablet 1 tab 3 x day for 2 days, then 1 tab 2 x day for 2 days, then 1 tab 1 x day for 3 days  . PRESCRIPTION MEDICATION Take 1 tablet by mouth every 6 (six) hours as needed (pain). "oxycodone"   No Known Allergies  PMHx:   Past Medical History  Diagnosis Date  . Hypertension   . Hyperlipidemia   . Diabetes mellitus without complication   . Hypogonadism male   . Right bundle branch block   . Obstructive sleep apnea   . Kidney stones    Immunization History  Administered Date(s) Administered  . DTaP 04/02/2012  . Influenza Split 02/17/2014  . Pneumococcal Polysaccharide-23 04/02/2012   Past Surgical History  Procedure Laterality Date  . Ankle surgery  2002  . Rhinoplasty  2001   FHx:    Reviewed / unchanged  SHx:    Reviewed / unchanged  Systems Review:  Constitutional: Denies fever, chills, wt changes, headaches, insomnia, fatigue, night sweats, change in appetite. Eyes: Denies redness, blurred vision, diplopia, discharge, itchy, watery eyes.  ENT: Denies discharge,  congestion, post nasal drip, epistaxis, sore throat, earache, hearing loss, dental pain, tinnitus, vertigo, sinus pain, snoring.  CV: Denies chest pain, palpitations, irregular heartbeat, syncope, dyspnea, diaphoresis, orthopnea, PND, claudication or edema. Respiratory: denies cough, dyspnea, DOE, pleurisy, hoarseness, laryngitis, wheezing.  Gastrointestinal: Denies dysphagia, odynophagia, heartburn, reflux, water brash, abdominal pain or cramps, nausea, vomiting, bloating, diarrhea, constipation, hematemesis, melena, hematochezia  or hemorrhoids. Genitourinary: Denies dysuria, frequency, urgency, nocturia, hesitancy, discharge, hematuria or flank pain. Musculoskeletal: Denies  arthralgias, myalgias, stiffness, jt. swelling, pain, limping or strain/sprain.  Skin: Denies pruritus, rash, hives, warts, acne, eczema or change in skin lesion(s). Neuro: No weakness, tremor, incoordination, spasms, paresthesia or pain. Psychiatric: Denies confusion, memory loss or sensory loss. Endo: Denies change in weight, skin or hair change.  Heme/Lymph: No excessive bleeding, bruising or enlarged lymph nodes.  Physical Exam  BP 124/80   Pulse 88  Temp 97.5 F  Resp 16  Ht 6' 1.5"   Wt 293 lb     BMI 38.13   Appears well nourished and in no distress. Eyes: PERRLA, EOMs, conjunctiva no swelling or erythema. Sinuses: No frontal/maxillary tenderness ENT/Mouth: EAC's clear, TM's nl w/o erythema, bulging. Nares clear w/o erythema, swelling, exudates. Oropharynx clear without erythema or exudates. Oral hygiene is good. Tongue normal, non obstructing. Hearing intact.  Neck: Supple. Thyroid nl. Car 2+/2+ without bruits, nodes or JVD. Chest: Respirations nl with BS clear & equal w/o rales, rhonchi, wheezing or stridor.  Cor: Heart sounds normal w/ regular rate and rhythm without sig. murmurs, gallops, clicks, or rubs. Peripheral pulses normal and equal  without edema.  Abdomen: Soft & bowel sounds normal. Non-tender w/o guarding, rebound, hernias, masses, or organomegaly.  Lymphatics: Unremarkable.  Musculoskeletal: Full ROM all peripheral extremities, joint stability, 5/5 strength, and normal gait.  Skin: Warm, dry without exposed rashes, lesions or ecchymosis apparent.  Neuro: Cranial nerves intact, reflexes equal bilaterally. Sensory-motor testing grossly intact. Tendon reflexes grossly intact.  Pysch: Alert & oriented x 3.  Insight and judgement nl & appropriate. No ideations.  Assessment and Plan:   1. Essential hypertension  - TSH  2. Mixed hyperlipidemia  - Lipid panel  3. Diabetes mellitus type II w/ CKD3  - Hemoglobin A1c - Insulin, random  4. Vitamin D  deficiency  - Vit D  25 hydroxy (rtn osteoporosis monitoring)  5. Testosterone deficiency   6. Medication management  - CBC with Differential/Platelet - BASIC METABOLIC PANEL WITH GFR - Hepatic function panel - Magnesium    Recommended regular exercise, BP monitoring, weight control, and discussed med and SE's. Recommended labs to assess and monitor clinical status. Further disposition pending results of labs. Over 30 minutes of exam, counseling, chart review was performed

## 2014-08-22 LAB — LIPID PANEL
Cholesterol: 121 mg/dL (ref 0–200)
HDL: 62 mg/dL (ref >=40)
LDL Cholesterol: 42 mg/dL (ref 0–99)
Total CHOL/HDL Ratio: 2 Ratio
Triglycerides: 83 mg/dL (ref <150)
VLDL: 17 mg/dL (ref 0–40)

## 2014-08-22 LAB — BASIC METABOLIC PANEL WITH GFR
BUN: 23 mg/dL (ref 6–23)
CHLORIDE: 105 meq/L (ref 96–112)
CO2: 23 mEq/L (ref 19–32)
Calcium: 9.4 mg/dL (ref 8.4–10.5)
Creat: 1.44 mg/dL — ABNORMAL HIGH (ref 0.50–1.35)
GFR, Est African American: 65 mL/min
GFR, Est Non African American: 56 mL/min — ABNORMAL LOW
GLUCOSE: 123 mg/dL — AB (ref 70–99)
POTASSIUM: 4.1 meq/L (ref 3.5–5.3)
Sodium: 140 mEq/L (ref 135–145)

## 2014-08-22 LAB — HEPATIC FUNCTION PANEL
ALK PHOS: 96 U/L (ref 39–117)
ALT: 28 U/L (ref 0–53)
AST: 15 U/L (ref 0–37)
Albumin: 4.2 g/dL (ref 3.5–5.2)
BILIRUBIN DIRECT: 0.3 mg/dL (ref 0.0–0.3)
BILIRUBIN TOTAL: 1.4 mg/dL — AB (ref 0.2–1.2)
Indirect Bilirubin: 1.1 mg/dL (ref 0.2–1.2)
Total Protein: 6.8 g/dL (ref 6.0–8.3)

## 2014-08-22 LAB — INSULIN, RANDOM: INSULIN: 8.6 u[IU]/mL (ref 2.0–19.6)

## 2014-08-22 LAB — VITAMIN D 25 HYDROXY (VIT D DEFICIENCY, FRACTURES): Vit D, 25-Hydroxy: 49 ng/mL (ref 30–100)

## 2014-08-22 LAB — TSH: TSH: 1.04 u[IU]/mL (ref 0.350–4.500)

## 2014-08-22 LAB — MAGNESIUM: Magnesium: 1.9 mg/dL (ref 1.5–2.5)

## 2014-09-09 ENCOUNTER — Encounter: Payer: Self-pay | Admitting: Physician Assistant

## 2014-09-11 ENCOUNTER — Other Ambulatory Visit: Payer: Self-pay | Admitting: *Deleted

## 2014-09-11 DIAGNOSIS — N529 Male erectile dysfunction, unspecified: Secondary | ICD-10-CM | POA: Insufficient documentation

## 2014-09-11 HISTORY — DX: Male erectile dysfunction, unspecified: N52.9

## 2014-09-11 MED ORDER — SILDENAFIL CITRATE 20 MG PO TABS: ORAL_TABLET | ORAL | Status: DC

## 2014-09-23 ENCOUNTER — Encounter (HOSPITAL_COMMUNITY): Payer: Self-pay | Admitting: *Deleted

## 2014-09-24 ENCOUNTER — Encounter (HOSPITAL_COMMUNITY)
Admission: RE | Admit: 2014-09-24 | Discharge: 2014-09-24 | Disposition: A | Discharge status: Home / Self Care | Payer: BLUE CROSS/BLUE SHIELD | Source: Ambulatory Visit | Attending: Urology | Admitting: Urology

## 2014-09-24 DIAGNOSIS — Z79899 Other long term (current) drug therapy: Secondary | ICD-10-CM | POA: Diagnosis not present

## 2014-09-24 DIAGNOSIS — Z841 Family history of disorders of kidney and ureter: Secondary | ICD-10-CM | POA: Diagnosis not present

## 2014-09-24 DIAGNOSIS — E119 Type 2 diabetes mellitus without complications: Secondary | ICD-10-CM | POA: Diagnosis not present

## 2014-09-24 DIAGNOSIS — G4733 Obstructive sleep apnea (adult) (pediatric): Secondary | ICD-10-CM | POA: Diagnosis not present

## 2014-09-24 DIAGNOSIS — N201 Calculus of ureter: Secondary | ICD-10-CM | POA: Diagnosis not present

## 2014-09-24 DIAGNOSIS — Z7982 Long term (current) use of aspirin: Secondary | ICD-10-CM | POA: Diagnosis not present

## 2014-09-25 ENCOUNTER — Encounter (HOSPITAL_COMMUNITY)
Admission: RE | Disposition: A | Discharge status: Home / Self Care | Payer: Self-pay | Source: Ambulatory Visit | Attending: Urology

## 2014-09-25 ENCOUNTER — Ambulatory Visit (HOSPITAL_COMMUNITY)
Admission: RE | Admit: 2014-09-25 | Discharge: 2014-09-25 | Disposition: A | Discharge status: Home / Self Care | Payer: BLUE CROSS/BLUE SHIELD | Source: Ambulatory Visit | Attending: Urology | Admitting: Urology

## 2014-09-25 ENCOUNTER — Ambulatory Visit (HOSPITAL_COMMUNITY): Payer: BLUE CROSS/BLUE SHIELD

## 2014-09-25 ENCOUNTER — Encounter (HOSPITAL_COMMUNITY): Payer: Self-pay | Admitting: *Deleted

## 2014-09-25 DIAGNOSIS — Z841 Family history of disorders of kidney and ureter: Secondary | ICD-10-CM | POA: Insufficient documentation

## 2014-09-25 DIAGNOSIS — E119 Type 2 diabetes mellitus without complications: Secondary | ICD-10-CM | POA: Insufficient documentation

## 2014-09-25 DIAGNOSIS — N201 Calculus of ureter: Secondary | ICD-10-CM | POA: Diagnosis not present

## 2014-09-25 DIAGNOSIS — G4733 Obstructive sleep apnea (adult) (pediatric): Secondary | ICD-10-CM | POA: Insufficient documentation

## 2014-09-25 DIAGNOSIS — Z79899 Other long term (current) drug therapy: Secondary | ICD-10-CM | POA: Insufficient documentation

## 2014-09-25 DIAGNOSIS — Z7982 Long term (current) use of aspirin: Secondary | ICD-10-CM | POA: Insufficient documentation

## 2014-09-25 HISTORY — PX: EXTRACORPOREAL SHOCK WAVE LITHOTRIPSY: SHX1557

## 2014-09-25 LAB — GLUCOSE, CAPILLARY: Glucose-Capillary: 133 mg/dL — ABNORMAL HIGH (ref 65–99)

## 2014-09-25 SURGERY — LITHOTRIPSY, ESWL
Anesthesia: LOCAL | Laterality: Left

## 2014-09-25 MED ORDER — DEXTROSE-NACL 5-0.45 % IV SOLN
INTRAVENOUS | Status: DC
  Administered 2014-09-25: 11:00:00 via INTRAVENOUS

## 2014-09-25 MED ORDER — DIAZEPAM 5 MG PO TABS
10.0000 mg | ORAL_TABLET | ORAL | Status: AC
  Administered 2014-09-25: 10 mg via ORAL
  Filled 2014-09-25: qty 2

## 2014-09-25 MED ORDER — DIPHENHYDRAMINE HCL 25 MG PO CAPS
25.0000 mg | ORAL_CAPSULE | ORAL | Status: AC
  Administered 2014-09-25: 25 mg via ORAL
  Filled 2014-09-25: qty 1

## 2014-09-25 MED ORDER — TAMSULOSIN HCL 0.4 MG PO CAPS: 0.4000 mg | ORAL_CAPSULE | Freq: Every day | ORAL | Status: DC

## 2014-09-25 MED ORDER — CIPROFLOXACIN HCL 500 MG PO TABS
500.0000 mg | ORAL_TABLET | ORAL | Status: AC
  Administered 2014-09-25: 500 mg via ORAL
  Filled 2014-09-25: qty 1

## 2014-09-25 NOTE — Op Note (Signed)
See [facility] OP note scanned into chart. Also because of the size, density, location and other factors that cannot be anticipated I feel this will likely be a staged procedure. This fact supersedes any indication in the scanned [facility] operative note to the contrary.  

## 2014-09-25 NOTE — H&P (Signed)
Reason For Visit Follow-up for his left distal ureteral stone   History of Present Illness 86M seen in f/u for 70mm left mid-ureteral stone. He presented to the ED with acute onset pain on 05/06/14.  He did have an acute bump in his creatinine to 2.63 and it appears his baseline may be 1.48.  Last visit stone had progressed into the distal ureter. PAtient largely asymptomatic. Had some pelvic pressure. Repeat creatinine 2.11. Was continued on medical expulsion therapy.  Intv: The patient denies any ongoing voiding symptoms. He has not had any gross hematuria. He has not had any flank pain. He has not had any dysuria or irritative voiding symptoms. He has not had any fevers or chills.   Past Medical History Problems  1. History of Obstructive sleep apnea, adult (G47.33)  Surgical History Problems  1. History of Rhinoplasty  Current Meds 1. Aspirin 81 MG Oral Tablet;  Therapy: (Recorded:06Jan2016) to Recorded 2. Lisinopril TABS;  Therapy: (Recorded:06Jan2016) to Recorded 3. MetFORMIN HCl TABS;  Therapy: (DJMEQAST:[id number]) to Recorded 4. Victoza SOLN;  Therapy: (LGXQJJHE:[id number]) to Recorded  Allergies Medication  1. No Known Drug Allergies  Family History Problems  1. Family history of cardiac disorder (Z82.49) : Grandparent 2. Family history of chronic renal failure syndrome (Z84.1) : Mother  Social History Problems  1. Alcohol use (Z78.9)   1 drink daily 2. Caffeine use (F15.90)   2 drinks daily 3. Married 4. Never a smoker 5. Non-smoker (Z78.9) 6. Number of children   2 daughters 7. Occupation   Development worker, community  Review of Systems No changes in pts bowel habits, neurological changes, or progressive lower urinary tract symptoms.    Vitals Vital Signs [Data Includes: Last 1 Day]  Recorded: 22Mar2016 03:38PM  Blood Pressure: 126 / 77 Temperature: 98 F Heart Rate: 87  Physical Exam Constitutional: Well nourished and well developed . No acute  distress.  Pulmonary: No respiratory distress and normal respiratory rhythm and effort.  Cardiovascular: Heart rate and rhythm are normal . No peripheral edema.  Lymphatics: The femoral and inguinal nodes are not enlarged or tender.  Skin: Normal skin turgor, no visible rash and no visible skin lesions.  Neuro/Psych:. Mood and affect are appropriate.    Results/Data Urine [Data Includes: Last 1 Day]   [id number]  COLOR YELLOW   APPEARANCE CLEAR   SPECIFIC GRAVITY 1.025   pH 5.0   GLUCOSE > 1000 mg/dL  BILIRUBIN NEG   KETONE NEG mg/dL  BLOOD TRACE   PROTEIN NEG mg/dL  UROBILINOGEN 0.2 mg/dL  NITRITE NEG   LEUKOCYTE ESTERASE NEG   SQUAMOUS EPITHELIAL/HPF RARE   WBC 0-2 WBC/hpf  RBC 0-2 RBC/hpf  BACTERIA RARE   CRYSTALS NONE SEEN   CASTS NONE SEEN    Patient's urinalysis is trace blood and glucosuria  Patient's KUB today obtained in clinic to evaluate for stone location: Demonstrates the renal shadows bilaterally. Down in the patient's pelvis the previously seen calcification does not appear to have migrated or moved any closer to the patient's ureteral orifices or bladder. This is located down in the left distal ureter. There are other phleboliths in this area that were present on the patient's previous KUBs. There are no additional calcifications along the expected trajectory of either ureter bilaterally. The bony structures are grossly normal. Gas pattern is without abnormality.   Assessment Assessed  1. Nephrolithiasis (N20.0)  The patient has a left distal ureteral stone which has not moved or shifted over the past several weeks.  He is currently asymptomatic. I went over the KUB with the patient detail. We discussed management options. I recommended against ongoing surveillance given his already present renal dysfunction. We've now been following the stone since the middle of January. I explained to him that the impacted distal ureteral stone is likely to result in loss of  kidney function over time. I recommended that we pursue more definitive treatment. We discussed ureteroscopy and shockwave lithotripsy. The stone is readily visible on KUB. As such, he is a reasonable shockwave candidate. I went over the risks and benefits of the procedure in detail. The patient understands that he may need additional procedures. Having heard the risk and benefits of all the treatment options. The patient would like to proceed with shockwave lithotripsy. We'll get this scheduled for the patient as soon as possible.   Plan Health Maintenance  1. UA With REFLEX; [Do Not Release]; Status:Complete;   Done: [id number] 03:21PM Nephrolithiasis  2. Follow-up Schedule Surgery Office  Follow-up  Status: Hold For - Appointment   Requested for: [phone]

## 2014-10-02 ENCOUNTER — Encounter: Payer: Self-pay | Admitting: Physician Assistant

## 2014-10-23 ENCOUNTER — Telehealth: Payer: Self-pay | Admitting: *Deleted

## 2014-10-23 NOTE — Telephone Encounter (Signed)
[facility] aware of approval.

## 2014-11-13 ENCOUNTER — Other Ambulatory Visit: Payer: Self-pay | Admitting: Urology

## 2014-12-02 ENCOUNTER — Encounter (HOSPITAL_BASED_OUTPATIENT_CLINIC_OR_DEPARTMENT_OTHER): Payer: Self-pay | Admitting: *Deleted

## 2014-12-02 NOTE — Progress Notes (Signed)
NPO AFTER MN WITH EXCEPTION CLEAR LIQUIDS UNTIL 0700.  ARRIVE AT 1100.  NEEDS ISTAT. CURRENT EKG IN CHART AND EPIC. MAY TAKE PAIN RX IF NEEDED AM DOS W/ SIPS OF WATER.

## 2014-12-04 ENCOUNTER — Ambulatory Visit: Payer: Self-pay | Admitting: Internal Medicine

## 2014-12-05 ENCOUNTER — Ambulatory Visit (HOSPITAL_BASED_OUTPATIENT_CLINIC_OR_DEPARTMENT_OTHER)
Admission: RE | Admit: 2014-12-05 | Discharge: 2014-12-05 | Disposition: A | Discharge status: Home / Self Care | Payer: BLUE CROSS/BLUE SHIELD | Source: Ambulatory Visit | Attending: Urology | Admitting: Urology

## 2014-12-05 ENCOUNTER — Encounter (HOSPITAL_BASED_OUTPATIENT_CLINIC_OR_DEPARTMENT_OTHER)
Admission: RE | Disposition: A | Discharge status: Home / Self Care | Payer: Self-pay | Source: Ambulatory Visit | Attending: Urology

## 2014-12-05 ENCOUNTER — Encounter (HOSPITAL_BASED_OUTPATIENT_CLINIC_OR_DEPARTMENT_OTHER): Payer: Self-pay | Admitting: *Deleted

## 2014-12-05 ENCOUNTER — Ambulatory Visit (HOSPITAL_BASED_OUTPATIENT_CLINIC_OR_DEPARTMENT_OTHER): Payer: BLUE CROSS/BLUE SHIELD | Admitting: Anesthesiology

## 2014-12-05 DIAGNOSIS — M1990 Unspecified osteoarthritis, unspecified site: Secondary | ICD-10-CM | POA: Insufficient documentation

## 2014-12-05 DIAGNOSIS — I10 Essential (primary) hypertension: Secondary | ICD-10-CM | POA: Diagnosis not present

## 2014-12-05 DIAGNOSIS — N201 Calculus of ureter: Secondary | ICD-10-CM | POA: Insufficient documentation

## 2014-12-05 DIAGNOSIS — Z7982 Long term (current) use of aspirin: Secondary | ICD-10-CM | POA: Insufficient documentation

## 2014-12-05 DIAGNOSIS — Z79899 Other long term (current) drug therapy: Secondary | ICD-10-CM | POA: Insufficient documentation

## 2014-12-05 DIAGNOSIS — E119 Type 2 diabetes mellitus without complications: Secondary | ICD-10-CM | POA: Diagnosis not present

## 2014-12-05 DIAGNOSIS — Z6837 Body mass index (BMI) 37.0-37.9, adult: Secondary | ICD-10-CM | POA: Insufficient documentation

## 2014-12-05 DIAGNOSIS — Z841 Family history of disorders of kidney and ureter: Secondary | ICD-10-CM | POA: Diagnosis not present

## 2014-12-05 DIAGNOSIS — G4733 Obstructive sleep apnea (adult) (pediatric): Secondary | ICD-10-CM | POA: Insufficient documentation

## 2014-12-05 DIAGNOSIS — N200 Calculus of kidney: Secondary | ICD-10-CM

## 2014-12-05 HISTORY — PX: CYSTOSCOPY/RETROGRADE/URETEROSCOPY/STONE EXTRACTION WITH BASKET: SHX5317

## 2014-12-05 HISTORY — DX: Obstructive sleep apnea (adult) (pediatric): G47.33

## 2014-12-05 HISTORY — PX: CYSTOSCOPY W/ RETROGRADES: SHX1426

## 2014-12-05 HISTORY — DX: Personal history of urinary calculi: Z87.442

## 2014-12-05 HISTORY — DX: Calculus of ureter: N20.1

## 2014-12-05 HISTORY — DX: Type 2 diabetes mellitus without complications: E11.9

## 2014-12-05 HISTORY — DX: Dependence on other enabling machines and devices: Z99.89

## 2014-12-05 LAB — POCT I-STAT 4, (NA,K, GLUC, HGB,HCT)
GLUCOSE: 293 mg/dL — AB (ref 65–99)
HEMATOCRIT: 57 % — AB (ref 39.0–52.0)
Hemoglobin: 19.4 g/dL — ABNORMAL HIGH (ref 13.0–17.0)
POTASSIUM: 4.2 mmol/L (ref 3.5–5.1)
Sodium: 139 mmol/L (ref 135–145)

## 2014-12-05 LAB — GLUCOSE, CAPILLARY: GLUCOSE-CAPILLARY: 250 mg/dL — AB (ref 65–99)

## 2014-12-05 SURGERY — CYSTOSCOPY, WITH CALCULUS REMOVAL USING BASKET
Anesthesia: General | Site: Ureter | Laterality: Left

## 2014-12-05 MED ORDER — ONDANSETRON HCL 4 MG/2ML IJ SOLN
4.0000 mg | Freq: Once | INTRAMUSCULAR | Status: DC | PRN
  Filled 2014-12-05: qty 2

## 2014-12-05 MED ORDER — IOHEXOL 350 MG/ML SOLN
INTRAVENOUS | Status: DC | PRN
  Administered 2014-12-05: 5 mL

## 2014-12-05 MED ORDER — LIDOCAINE HCL (CARDIAC) 20 MG/ML IV SOLN
INTRAVENOUS | Status: DC | PRN
  Administered 2014-12-05: 80 mg via INTRAVENOUS

## 2014-12-05 MED ORDER — FENTANYL CITRATE (PF) 100 MCG/2ML IJ SOLN
INTRAMUSCULAR | Status: DC | PRN
  Administered 2014-12-05: 50 ug via INTRAVENOUS

## 2014-12-05 MED ORDER — SODIUM CHLORIDE 0.9 % IR SOLN
Status: DC | PRN
  Administered 2014-12-05: 4000 mL

## 2014-12-05 MED ORDER — LIDOCAINE HCL 2 % EX GEL
CUTANEOUS | Status: DC | PRN
  Administered 2014-12-05: 1

## 2014-12-05 MED ORDER — ONDANSETRON HCL 4 MG/2ML IJ SOLN
INTRAMUSCULAR | Status: DC | PRN
  Administered 2014-12-05: 4 mg via INTRAVENOUS

## 2014-12-05 MED ORDER — KETOROLAC TROMETHAMINE 30 MG/ML IJ SOLN
INTRAMUSCULAR | Status: DC | PRN
  Administered 2014-12-05: 30 mg via INTRAVENOUS

## 2014-12-05 MED ORDER — DEXAMETHASONE SODIUM PHOSPHATE 4 MG/ML IJ SOLN
INTRAMUSCULAR | Status: DC | PRN
  Administered 2014-12-05: 10 mg via INTRAVENOUS

## 2014-12-05 MED ORDER — MIDAZOLAM HCL 2 MG/2ML IJ SOLN
INTRAMUSCULAR | Status: AC
  Filled 2014-12-05: qty 2

## 2014-12-05 MED ORDER — FENTANYL CITRATE (PF) 100 MCG/2ML IJ SOLN
INTRAMUSCULAR | Status: AC
  Filled 2014-12-05: qty 6

## 2014-12-05 MED ORDER — CIPROFLOXACIN IN D5W 400 MG/200ML IV SOLN
400.0000 mg | INTRAVENOUS | Status: AC
  Administered 2014-12-05: 400 mg via INTRAVENOUS
  Filled 2014-12-05: qty 200

## 2014-12-05 MED ORDER — MIDAZOLAM HCL 5 MG/5ML IJ SOLN
INTRAMUSCULAR | Status: DC | PRN
  Administered 2014-12-05: 2 mg via INTRAVENOUS

## 2014-12-05 MED ORDER — LACTATED RINGERS IV SOLN
INTRAVENOUS | Status: DC
  Administered 2014-12-05 (×2): via INTRAVENOUS
  Filled 2014-12-05: qty 1000

## 2014-12-05 MED ORDER — HYDROMORPHONE HCL 1 MG/ML IJ SOLN
0.2500 mg | INTRAMUSCULAR | Status: DC | PRN
  Filled 2014-12-05: qty 1

## 2014-12-05 MED ORDER — MEPERIDINE HCL 25 MG/ML IJ SOLN
6.2500 mg | INTRAMUSCULAR | Status: DC | PRN
  Filled 2014-12-05: qty 1

## 2014-12-05 MED ORDER — PROPOFOL 10 MG/ML IV BOLUS
INTRAVENOUS | Status: DC | PRN
  Administered 2014-12-05: 50 mg via INTRAVENOUS
  Administered 2014-12-05: 250 mg via INTRAVENOUS

## 2014-12-05 MED ORDER — CIPROFLOXACIN IN D5W 400 MG/200ML IV SOLN
INTRAVENOUS | Status: AC
Start: 2014-12-05 — End: 2014-12-05
  Filled 2014-12-05: qty 200

## 2014-12-05 SURGICAL SUPPLY — 34 items
BAG DRAIN URO-CYSTO SKYTR STRL (DRAIN) ×3 IMPLANT
BASKET LASER NITINOL 1.9FR (BASKET) IMPLANT
BASKET STNLS GEMINI 4WIRE 3FR (BASKET) IMPLANT
BASKET STONE 1.7 NGAGE (UROLOGICAL SUPPLIES) IMPLANT
BASKET ZERO TIP NITINOL 2.4FR (BASKET) IMPLANT
CANISTER SUCT LVC 12 LTR MEDI- (MISCELLANEOUS) ×3 IMPLANT
CATH INTERMIT  6FR 70CM (CATHETERS) ×3 IMPLANT
CATH URET 5FR 28IN OPEN ENDED (CATHETERS) ×3 IMPLANT
CATH URET DUAL LUMEN 6-10FR 50 (CATHETERS) IMPLANT
CLOTH BEACON ORANGE TIMEOUT ST (SAFETY) ×3 IMPLANT
FIBER LASER FLEXIVA 365 (UROLOGICAL SUPPLIES) IMPLANT
FIBER LASER TRAC TIP (UROLOGICAL SUPPLIES) IMPLANT
GLOVE BIO SURGEON STRL SZ 6.5 (GLOVE) ×3 IMPLANT
GLOVE BIO SURGEON STRL SZ7.5 (GLOVE) ×3 IMPLANT
GLOVE BIOGEL PI IND STRL 6.5 (GLOVE) ×4 IMPLANT
GLOVE BIOGEL PI INDICATOR 6.5 (GLOVE) ×2
GOWN STRL REUS W/ TWL XL LVL3 (GOWN DISPOSABLE) ×2 IMPLANT
GOWN STRL REUS W/TWL LRG LVL3 (GOWN DISPOSABLE) ×3 IMPLANT
GOWN STRL REUS W/TWL XL LVL3 (GOWN DISPOSABLE) ×1
GUIDEWIRE 0.038 PTFE COATED (WIRE) IMPLANT
GUIDEWIRE ANG ZIPWIRE 038X150 (WIRE) IMPLANT
GUIDEWIRE STR DUAL SENSOR (WIRE) ×6 IMPLANT
IV NS 1000ML (IV SOLUTION) ×1
IV NS 1000ML BAXH (IV SOLUTION) ×2 IMPLANT
IV NS IRRIG 3000ML ARTHROMATIC (IV SOLUTION) ×3 IMPLANT
KIT BALLIN UROMAX 15FX10 (LABEL) IMPLANT
KIT BALLN UROMAX 15FX4 (MISCELLANEOUS) IMPLANT
KIT BALLN UROMAX 26 75X4 (MISCELLANEOUS)
MANIFOLD NEPTUNE II (INSTRUMENTS) ×3 IMPLANT
NS IRRIG 500ML POUR BTL (IV SOLUTION) IMPLANT
PACK CYSTO (CUSTOM PROCEDURE TRAY) ×3 IMPLANT
SET HIGH PRES BAL DIL (LABEL)
SHEATH ACCESS URETERAL 38CM (SHEATH) IMPLANT
TUBE FEEDING 8FR 16IN STR KANG (MISCELLANEOUS) IMPLANT

## 2014-12-05 NOTE — Anesthesia Postprocedure Evaluation (Signed)
  Anesthesia Post-op Note  Patient: Carlos [name]  Procedure(s) Performed: Procedure(s) (LRB): LEFT  URETEROSCOPY, STONE EXTRACTION  (Left) CYSTOSCOPY WITH RETROGRADE PYELOGRAM (Left)  Patient Location: PACU  Anesthesia Type: General  Level of Consciousness: awake and alert   Airway and Oxygen Therapy: Patient Spontanous Breathing  Post-op Pain: mild  Post-op Assessment: Post-op Vital signs reviewed, Patient's Cardiovascular Status Stable, Respiratory Function Stable, Patent Airway and No signs of Nausea or vomiting  Last Vitals:  Filed Vitals:   12/05/14 1415  BP: 117/61  Pulse: 87  Temp:   Resp: 19    Post-op Vital Signs: stable   Complications: No apparent anesthesia complications

## 2014-12-05 NOTE — Op Note (Addendum)
Preoperative diagnosis: left ureteral calculus  Postoperative diagnosis: left ureteral calculus  Procedure:  1. Cystoscopy 2. left ureteroscopy and stone removal 3. left retrograde pyelography with interpretation  Surgeon: Ardis Hughs, MD  Anesthesia: General  Complications: None  Intraoperative findings: left retrograde pyelography demonstrated a filling defect within the left ureter consistent with the patient's known calculus without other abnormalities.  EBL: Minimal  Specimens: 1. left ureteral calculus  Disposition of specimens: [facility] for stone analysis  Indication: Carlos [name] is a 54 y.o.   patient with urolithiasis. After reviewing the management options for treatment, the patient elected to proceed with the above surgical procedure(s). We have discussed the potential benefits and risks of the procedure, side effects of the proposed treatment, the likelihood of the patient achieving the goals of the procedure, and any potential problems that might occur during the procedure or recuperation. Informed consent has been obtained.  Description of procedure:  The patient was taken to the operating room and general anesthesia was induced.  The patient was placed in the dorsal lithotomy position, prepped and draped in the usual sterile fashion, and preoperative antibiotics were administered. A preoperative time-out was performed.   Cystourethroscopy was performed.  The patient's urethra was examined and demonstrated bilobar prostatic hypertrophy with a median lobe. The bladder was then systematically examined in its entirety. There was no evidence for any bladder tumors, stones, or other mucosal pathology.    Attention then turned to the left ureteral orifice and a ureteral catheter was used to intubate the ureteral orifice.  Omnipaque contrast was injected through the ureteral catheter and a retrograde pyelogram was performed with findings as  dictated above.  A 0.38 sensor guidewire was then advanced up the left ureter into the renal pelvis under fluoroscopic guidance. The 6 Fr semirigid ureteroscope was then advanced into the ureter next to the guidewire. The scope was advanced as far as possible into the proximal ureter and no stone was identified. I then placed a 0.38 sensor wire through the scope and removed the scope under visual guidance. I then advanced a flexible ureteroscope over the second wire and into the left renal pelvis removing the wire. I then performedb and visualized the entire collecting system and was unable to identify any stones. I then backed out the scope slowly through sure that the stone had not then passed in the ureter. The ureter was in good condition but no stone was seen. I then quickly looked within the bladder and noted that the 82mm stone was sitting in the bladder, which subsequently was removed through the cystoscope. It must have come into the bladder on its own. I then repeated the retrograde pyelogram by instilling contrast into a 5 [place] open-ended ureteral catheter into the left collecting system. I then removed the catheter in the collecting system quickly drained. There was no significant edema at the ureteral orifice. As such, I opted not to leave a stent at this time. I drained the patient's bladder instilled lidocaine jelly into the urethra, and the patient was subsequently extubated and returned to the PACU in stable condition.  Disposition: The patient was to discharge home without a stent. He has follow-up scheduled for 6 weeks with a renal ultrasound prior.

## 2014-12-05 NOTE — Anesthesia Preprocedure Evaluation (Addendum)
Anesthesia Evaluation  Patient identified by MRN, date of birth, ID band Patient awake    Reviewed: Allergy & Precautions, NPO status , Patient's Chart, lab work & pertinent test results  Airway Mallampati: II  TM Distance: >3 FB Neck ROM: Full    Dental  (+) Teeth Intact, Dental Advisory Given   Pulmonary sleep apnea ,  Pt denies wearing his C-Pap   Pulmonary exam normal       Cardiovascular Exercise Tolerance: Good hypertension, Pt. on medications Normal cardiovascular examRhythm:Regular Rate:Normal     Neuro/Psych    GI/Hepatic   Endo/Other  diabetes, Type 2, Oral Hypoglycemic AgentsMorbid obesity  Renal/GU      Musculoskeletal  (+) Arthritis -, Osteoarthritis,    Abdominal   Peds  Hematology   Anesthesia Other Findings   Reproductive/Obstetrics                         Anesthesia Physical Anesthesia Plan  ASA: III  Anesthesia Plan: General   Post-op Pain Management:    Induction: Intravenous  Airway Management Planned: LMA  Additional Equipment:   Intra-op Plan:   Post-operative Plan: Extubation in OR  Informed Consent: I have reviewed the patients History and Physical, chart, labs and discussed the procedure including the risks, benefits and alternatives for the proposed anesthesia with the patient or authorized representative who has indicated his/her understanding and acceptance.     Plan Discussed with: CRNA, Surgeon and Anesthesiologist  Anesthesia Plan Comments:        Anesthesia Quick Evaluation

## 2014-12-05 NOTE — Transfer of Care (Signed)
Immediate Anesthesia Transfer of Care Note  Patient: Carlos [name]  Procedure(s) Performed: Procedure(s): LEFT  URETEROSCOPY, STONE EXTRACTION  (Left) CYSTOSCOPY WITH RETROGRADE PYELOGRAM (Left)  Patient Location: PACU  Anesthesia Type:General  Level of Consciousness: awake, alert , oriented and patient cooperative  Airway & Oxygen Therapy: Patient Spontanous Breathing and Patient connected to nasal cannula oxygen  Post-op Assessment: Report given to RN and Post -op Vital signs reviewed and stable  Post vital signs: Reviewed and stable  Last Vitals:  Filed Vitals:   12/05/14 1109  BP: 121/69  Pulse: 103  Temp: 37.2 C  Resp: 18    Complications: No apparent anesthesia complications

## 2014-12-05 NOTE — Discharge Instructions (Signed)
DISCHARGE INSTRUCTIONS FOR KIDNEY STONE/URETERAL STENT   MEDICATIONS:  1.  Resume all your other meds from home - except do not take any extra narcotic pain meds that you may have at home.  2. No additional medications have been prescribed today.  ACTIVITY:  1. No strenuous activity x 1week  2. No driving while on narcotic pain medications  3. Drink plenty of water  4. Continue to walk at home - you can still get blood clots when you are at home, so keep active, but don't over do it.  5. May return to work/school tomorrow or when you feel ready   BATHING:  1. You can shower and we recommend daily showers     SIGNS/SYMPTOMS TO CALL:  Please call us if you have a fever greater than 101.5, uncontrolled nausea/vomiting, uncontrolled pain, dizziness, unable to urinate, bloody urine, chest pain, shortness of breath, leg swelling, leg pain, redness around wound, drainage from wound, or any other concerns or questions.   You can reach [place] at [phone].   FOLLOW-UP:  1. You have an appointment in 6 weeks with a ultrasound of your kidneys prior.    No Advil, aleve, ibuprofen until 7 pm tonight   Post Anesthesia Home Care Instructions  Activity: Get plenty of rest for the remainder of the day. A responsible adult should stay with you for 24 hours following the procedure.  For the next 24 hours, DO NOT: -Drive a car -Paediatric nurse -Drink alcoholic beverages -Take any medication unless instructed by your physician -Make any legal decisions or sign important papers.  Meals: Start with liquid foods such as gelatin or soup. Progress to regular foods as tolerated. Avoid greasy, spicy, heavy foods. If nausea and/or vomiting occur, drink only clear liquids until the nausea and/or vomiting subsides. Call your physician if vomiting continues.  Special Instructions/Symptoms: Your throat may feel dry or sore from the anesthesia or the breathing tube placed in your throat during surgery. If  this causes discomfort, gargle with warm salt water. The discomfort should disappear within 24 hours.  If you had a scopolamine patch placed behind your ear for the management of post- operative nausea and/or vomiting:  1. The medication in the patch is effective for 72 hours, after which it should be removed.  Wrap patch in a tissue and discard in the trash. Wash hands thoroughly with soap and water. 2. You may remove the patch earlier than 72 hours if you experience unpleasant side effects which may include dry mouth, dizziness or visual disturbances. 3. Avoid touching the patch. Wash your hands with soap and water after contact with the patch.

## 2014-12-05 NOTE — H&P (Signed)
Reason For Visit f/u for left distal ureteral stone   History of Present Illness 79M s/p left ESWL for distal ureteral stone. He has been asymptomatic. The patient passed one small stone fragment initially following procedure. He denies any flank pain or hematuria. He did have one day where he had severe nausea and vomiting. This was not associated with any pain. He denies any fevers or chills.  The patient presents for a KUB today and follow-up.   Past Medical History Problems  1. History of Obstructive sleep apnea, adult (G47.33)  Surgical History Problems  1. History of Lithotripsy 2. History of Rhinoplasty  Current Meds 1. Aspirin 81 MG TABS;  Therapy: (Recorded:06Jan2016) to Recorded 2. Lisinopril TABS;  Therapy: (Recorded:06Jan2016) to Recorded 3. MetFORMIN HCl TABS;  Therapy: (GGYIRSWN:[id number]) to Recorded 4. Victoza SOLN;  Therapy: (XFGHWEXH:[id number]) to Recorded  Allergies Medication  1. No Known Drug Allergies  Family History Problems  1. Family history of cardiac disorder (Z82.49) : Grandparent 2. Family history of chronic renal failure syndrome (Z84.1) : Mother  Social History Problems  1. Alcohol use (Z78.9)   1 drink daily 2. Caffeine use (F15.90)   2 drinks daily 3. Married 4. Never a smoker 5. Non-smoker (Z78.9) 6. Number of children   2 daughters 7. Occupation   Medical laboratory scientific officer Vital Signs [Data Includes: Last 1 Day]  Recorded: [id number] 03:49PM  Blood Pressure: 93 / 64 Temperature: 97.9 F Heart Rate: 76  Physical Exam Constitutional: Well nourished and well developed . No acute distress.  ENT:. The ears and nose are normal in appearance.  Neck: The appearance of the neck is normal and no neck mass is present.  Pulmonary: No respiratory distress and normal respiratory rhythm and effort.  Cardiovascular: Heart rate and rhythm are normal . No peripheral edema.  Skin: Normal skin turgor, no visible rash and no visible  skin lesions.  Neuro/Psych:. Mood and affect are appropriate.    Results/Data Urine [Data Includes: Last 1 Day]   [id number]  COLOR YELLOW   APPEARANCE CLEAR   SPECIFIC GRAVITY 1.020   pH 5.5   GLUCOSE > 1000 mg/dL  BILIRUBIN NEG   KETONE NEG mg/dL  BLOOD TRACE   PROTEIN NEG mg/dL  UROBILINOGEN 0.2 mg/dL  NITRITE NEG   LEUKOCYTE ESTERASE NEG   SQUAMOUS EPITHELIAL/HPF NONE SEEN   WBC NONE SEEN WBC/hpf  RBC 0-2 RBC/hpf  BACTERIA NONE SEEN   CRYSTALS NONE SEEN   CASTS NONE SEEN    The patient's urinalysis today demonstrates glucosuria.  KUB performed in clinic today to evaluate for the patient's residual stone fragment. The renal shadows are visible bilaterally. The patient has no definable stones within the renal pelvis bilaterally. The previously seen stone in the left distal ureter is still present. This appears to be just proximal to the UVJ. This was present on the film one month prior. This was the stone fragment that we targeted the shockwave therapy. This has clearly been a  failed shockwave.  Assessment Assessed  1. Calculus of left ureter (N20.1)  Plan Health Maintenance  1. UA With REFLEX; [Do Not Release]; Status:Complete;   Done: [id number] 03:32PM Nephrolithiasis  2. Follow-up Schedule Surgery Office  Follow-up  Status: Hold For - Appointment   Requested for: [id number] 3. URINE CULTURE; Status:In Progress - Specimen/Data Collected;   Done: [id number]  Discussion/Summary The patient has persistent left distal ureteral stone. Currently is asymptomatic. He clearly had a failed shockwave lithotripsy. As such, we discussed ongoing  management options. We discussed repeat shockwave lithotripsy versus ureteroscopy, laser lithotripsy and stone extraction. I recommended the patient consider ureteroscopy so that we could once and for all treat the stone. I described the procedure for the patient detail. He understands that following the procedure he will need a stent for  several days. Given that he is not symptomatic currently we can do it this scheduled at the patient's convenience however, I recommended we take care of this within the next month.

## 2014-12-05 NOTE — Anesthesia Procedure Notes (Signed)
Procedure Name: LMA Insertion Date/Time: 12/05/2014 1:18 PM Performed by: Wanita Chamberlain Pre-anesthesia Checklist: Patient identified, Timeout performed, Emergency Drugs available, Suction available and Patient being monitored Patient Re-evaluated:Patient Re-evaluated prior to inductionOxygen Delivery Method: Circle system utilized Preoxygenation: Pre-oxygenation with 100% oxygen Intubation Type: IV induction Ventilation: Mask ventilation without difficulty LMA: LMA inserted LMA Size: 5.0 Number of attempts: 2 Airway Equipment and Method: Bite block Placement Confirmation: breath sounds checked- equal and bilateral and positive ETCO2 Tube secured with: Tape Dental Injury: Teeth and Oropharynx as per pre-operative assessment

## 2014-12-08 ENCOUNTER — Encounter (HOSPITAL_BASED_OUTPATIENT_CLINIC_OR_DEPARTMENT_OTHER): Payer: Self-pay | Admitting: Urology

## 2014-12-12 ENCOUNTER — Ambulatory Visit (INDEPENDENT_AMBULATORY_CARE_PROVIDER_SITE_OTHER): Payer: BLUE CROSS/BLUE SHIELD | Admitting: Physician Assistant

## 2014-12-12 ENCOUNTER — Encounter: Payer: Self-pay | Admitting: Physician Assistant

## 2014-12-12 VITALS — BP 116/76 | HR 80 | Temp 98.1°F | Resp 16 | Ht 73.5 in | Wt 293.6 lb

## 2014-12-12 DIAGNOSIS — Z87442 Personal history of urinary calculi: Secondary | ICD-10-CM

## 2014-12-12 DIAGNOSIS — E349 Endocrine disorder, unspecified: Secondary | ICD-10-CM

## 2014-12-12 DIAGNOSIS — E782 Mixed hyperlipidemia: Secondary | ICD-10-CM

## 2014-12-12 DIAGNOSIS — G4733 Obstructive sleep apnea (adult) (pediatric): Secondary | ICD-10-CM

## 2014-12-12 DIAGNOSIS — E559 Vitamin D deficiency, unspecified: Secondary | ICD-10-CM

## 2014-12-12 DIAGNOSIS — E669 Obesity, unspecified: Secondary | ICD-10-CM

## 2014-12-12 DIAGNOSIS — Z0000 Encounter for general adult medical examination without abnormal findings: Secondary | ICD-10-CM

## 2014-12-12 DIAGNOSIS — Z125 Encounter for screening for malignant neoplasm of prostate: Secondary | ICD-10-CM

## 2014-12-12 DIAGNOSIS — I10 Essential (primary) hypertension: Secondary | ICD-10-CM | POA: Diagnosis not present

## 2014-12-12 DIAGNOSIS — Z0001 Encounter for general adult medical examination with abnormal findings: Secondary | ICD-10-CM

## 2014-12-12 DIAGNOSIS — Z9989 Dependence on other enabling machines and devices: Secondary | ICD-10-CM

## 2014-12-12 DIAGNOSIS — E1122 Type 2 diabetes mellitus with diabetic chronic kidney disease: Secondary | ICD-10-CM

## 2014-12-12 DIAGNOSIS — N529 Male erectile dysfunction, unspecified: Secondary | ICD-10-CM

## 2014-12-12 DIAGNOSIS — Z79899 Other long term (current) drug therapy: Secondary | ICD-10-CM

## 2014-12-12 DIAGNOSIS — Z1159 Encounter for screening for other viral diseases: Secondary | ICD-10-CM

## 2014-12-12 LAB — URINALYSIS, MICROSCOPIC ONLY
Bacteria, UA: NONE SEEN [HPF]
CRYSTALS: NONE SEEN [HPF]
Casts: NONE SEEN [LPF]
RBC / HPF: NONE SEEN RBC/HPF (ref <=2)
Squamous Epithelial / LPF: NONE SEEN [HPF] (ref <=5)
WBC, UA: NONE SEEN WBC/HPF (ref <=5)
Yeast: NONE SEEN [HPF]

## 2014-12-12 LAB — BASIC METABOLIC PANEL WITH GFR
BUN: 24 mg/dL (ref 7–25)
CALCIUM: 9.7 mg/dL (ref 8.6–10.3)
CO2: 25 mmol/L (ref 20–31)
CREATININE: 1.7 mg/dL — AB (ref 0.70–1.33)
Chloride: 103 mmol/L (ref 98–110)
GFR, Est African American: 53 mL/min — ABNORMAL LOW (ref >=60)
GFR, Est Non African American: 46 mL/min — ABNORMAL LOW (ref >=60)
Glucose, Bld: 188 mg/dL — ABNORMAL HIGH (ref 65–99)
Potassium: 4.3 mmol/L (ref 3.5–5.3)
Sodium: 139 mmol/L (ref 135–146)

## 2014-12-12 LAB — CBC WITH DIFFERENTIAL/PLATELET
BASOS PCT: 1 % (ref 0–1)
Basophils Absolute: 0 10*3/uL (ref 0.0–0.1)
Eosinophils Absolute: 0.1 10*3/uL (ref 0.0–0.7)
Eosinophils Relative: 3 % (ref 0–5)
HEMATOCRIT: 51.7 % (ref 39.0–52.0)
HEMOGLOBIN: 18 g/dL — AB (ref 13.0–17.0)
LYMPHS ABS: 2.2 10*3/uL (ref 0.7–4.0)
Lymphocytes Relative: 45 % (ref 12–46)
MCH: 29.1 pg (ref 26.0–34.0)
MCHC: 34.8 g/dL (ref 30.0–36.0)
MCV: 83.7 fL (ref 78.0–100.0)
MONO ABS: 0.3 10*3/uL (ref 0.1–1.0)
MONOS PCT: 7 % (ref 3–12)
MPV: 10.5 fL (ref 8.6–12.4)
NEUTROS PCT: 44 % (ref 43–77)
Neutro Abs: 2.1 10*3/uL (ref 1.7–7.7)
Platelets: 204 10*3/uL (ref 150–400)
RBC: 6.18 MIL/uL — ABNORMAL HIGH (ref 4.22–5.81)
RDW: 13.8 % (ref 11.5–15.5)
WBC: 4.8 10*3/uL (ref 4.0–10.5)

## 2014-12-12 LAB — LIPID PANEL
Cholesterol: 157 mg/dL (ref 125–200)
HDL: 42 mg/dL (ref >=40)
LDL CALC: 73 mg/dL (ref <130)
Total CHOL/HDL Ratio: 3.7 Ratio (ref <=5.0)
Triglycerides: 212 mg/dL — ABNORMAL HIGH (ref <150)
VLDL: 42 mg/dL — ABNORMAL HIGH (ref <30)

## 2014-12-12 LAB — HEPATIC FUNCTION PANEL
ALBUMIN: 4.2 g/dL (ref 3.6–5.1)
ALT: 19 U/L (ref 9–46)
AST: 12 U/L (ref 10–35)
Alkaline Phosphatase: 109 U/L (ref 40–115)
BILIRUBIN TOTAL: 1.3 mg/dL — AB (ref 0.2–1.2)
Bilirubin, Direct: 0.2 mg/dL (ref <=0.2)
Indirect Bilirubin: 1.1 mg/dL (ref 0.2–1.2)
Total Protein: 6.7 g/dL (ref 6.1–8.1)

## 2014-12-12 LAB — HEPATITIS C ANTIBODY: HCV AB: NEGATIVE

## 2014-12-12 LAB — URINALYSIS, ROUTINE W REFLEX MICROSCOPIC
Bilirubin Urine: NEGATIVE
HGB URINE DIPSTICK: NEGATIVE
LEUKOCYTES UA: NEGATIVE
NITRITE: NEGATIVE
Specific Gravity, Urine: 1.031 (ref 1.001–1.035)
pH: 5 (ref 5.0–8.0)

## 2014-12-12 LAB — MAGNESIUM: MAGNESIUM: 2.1 mg/dL (ref 1.5–2.5)

## 2014-12-12 LAB — HEMOGLOBIN A1C
HEMOGLOBIN A1C: 10.5 % — AB (ref <5.7)
Mean Plasma Glucose: 255 mg/dL — ABNORMAL HIGH (ref <117)

## 2014-12-12 LAB — TSH: TSH: 0.933 u[IU]/mL (ref 0.350–4.500)

## 2014-12-12 LAB — HIV ANTIBODY (ROUTINE TESTING W REFLEX): HIV 1&2 Ab, 4th Generation: NONREACTIVE

## 2014-12-12 LAB — TESTOSTERONE: Testosterone: 260 ng/dL — ABNORMAL LOW (ref 300–890)

## 2014-12-12 LAB — URIC ACID: Uric Acid, Serum: 8.7 mg/dL — ABNORMAL HIGH (ref 4.0–7.8)

## 2014-12-12 LAB — VITAMIN D 25 HYDROXY (VIT D DEFICIENCY, FRACTURES): VIT D 25 HYDROXY: 47 ng/mL (ref 30–100)

## 2014-12-12 NOTE — Progress Notes (Signed)
Complete Physical  Assessment and Plan: 1. Essential hypertension - continue medications, DASH diet, exercise and monitor at home. Call if greater than 130/80.  - CBC with Differential/Platelet - BASIC METABOLIC PANEL WITH GFR - Hepatic function panel - TSH - Urinalysis, Routine w reflex microscopic (not at [facility]) - Microalbumin / creatinine urine ratio - EKG 12-Lead - [place], RETROPERITNL ABD,  LTD  2. Mixed hyperlipidemia -continue medications, check lipids, decrease fatty foods, increase activity.  - Lipid panel  3. Type 2 diabetes mellitus with diabetic chronic kidney disease Discussed general issues about diabetes pathophysiology and management., Educational material distributed., Suggested low cholesterol diet., Encouraged aerobic exercise., Discussed foot care., Reminded to get yearly retinal exam. - Hemoglobin A1c - Insulin, fasting - Uric acid - LOW EXTREMITY NEUR EXAM DOCUM  4. Testosterone deficiency - Testosterone  5. Vitamin D deficiency - Vit D  25 hydroxy (rtn osteoporosis monitoring)  6. Medication management - Magnesium  7. OSA on CPAP Continue CPAP, weight loss advised  8. Erectile dysfunction, unspecified erectile dysfunction type Weight loss advised, decrease sugars  9. Obesity Obesity with co morbidities- long discussion about weight loss, diet, and exercise  10. Prostate cancer screening Will defer urology  11. History of nephrolithiasis Continue follow up urology, increase water  12. Encounter for general adult medical examination with abnormal findings NEEDS COLONOSCOPY WANTS TO WAIT UNTIL AFTER GRAD SCHOOL/NEXT YEAR Declines hemoccult cards - CBC with Differential/Platelet - BASIC METABOLIC PANEL WITH GFR - Hepatic function panel - TSH - Lipid panel - Hemoglobin A1c - Insulin, fasting - Magnesium - Vit D  25 hydroxy (rtn osteoporosis monitoring) - Urinalysis, Routine w reflex microscopic (not at [facility]) - Microalbumin / creatinine urine  ratio - Testosterone - Uric acid - Hepatitis C antibody - HIV antibody  13. Screening for viral disease - Hepatitis C antibody - HIV antibody   Discussed med's effects and SE's. Screening labs and tests as requested with regular follow-up as recommended.   HPI 54 y.o. male  presents for a complete physical. His blood pressure has been controlled at home, today their BP is BP: 116/76 mmHg He does not workout, does some lawn work. He denies chest pain, shortness of breath, dizziness.  He is not on cholesterol medication and denies myalgias. His cholesterol is at goal. The cholesterol last visit was:   Lab Results  Component Value Date   CHOL 121 08/21/2014   HDL 62 08/21/2014   LDLCALC 42 08/21/2014   TRIG 83 08/21/2014   CHOLHDL 2.0 08/21/2014   He has been working on diet and exercise for diabetes with CKD, he is on ASA, he is on ACE, he is on invokana, victoza, MF, has been a little high lately due to increase in fruit and denies paresthesia of the feet, polydipsia and polyuria. Last A1C in the office was:  Lab Results  Component Value Date   HGBA1C 7.9* 08/21/2014   Lab Results  Component Value Date   GFRAA 65 08/21/2014   Patient is on Vitamin D supplement, 5000 IU daily Lab Results  Component Value Date   VD25OH 76 08/21/2014   He is has a history of testosterone deficiency and is on testosterone replacement at this time, 1.5 cc every 2 week, last shot 2 weeks ago. He states that the testosterone is help with his energy, libido, muscle mass.  Lab Results  Component Value Date   TESTOSTERONE 166* 09/02/2013   Has history of kidney stone, follows with Dr. Louis Meckel, failed lithotripsy,  had ureteroscopy and stone removal 2 weeks ago.  He is in grad school and almost done, will be done in Dec. BMI is Body mass index is 38.21 kg/(m^2)., he is working on diet and exercise. Has OSA and is on CPAP.  Wt Readings from Last 3 Encounters:  12/12/14 293 lb 9.6 oz (133.176 kg)   12/05/14 291 lb (131.997 kg)  09/25/14 295 lb 6.4 oz (133.993 kg)    Current Medications:  Current Outpatient Prescriptions on File Prior to Visit  Medication Sig Dispense Refill  . aspirin EC 81 MG tablet Take 81 mg by mouth daily.    . canagliflozin (INVOKANA) 300 MG TABS tablet Take 300 mg by mouth daily. (Patient taking differently: Take 300 mg by mouth daily before breakfast. ) 30 tablet 3  . Cholecalciferol (VITAMIN D-3) 5000 UNITS TABS Take 5,000 Units by mouth daily.    Marland Kitchen ibuprofen (ADVIL,MOTRIN) 200 MG tablet Take 400 mg by mouth every 6 (six) hours as needed for moderate pain.    . Liraglutide (VICTOZA) 18 MG/3ML SOPN inject 1.8 mgs daily as directed (Patient taking differently: Inject 1.8 mg into the skin every morning. ) 9 mL 3  . lisinopril (PRINIVIL,ZESTRIL) 20 MG tablet Take 1 tablet (20 mg total) by mouth daily. (Patient taking differently: Take 20 mg by mouth every morning. ) 30 tablet 3  . metFORMIN (GLUCOPHAGE-XR) 500 MG 24 hr tablet Take 4 tablets (2,000 mg total) by mouth daily. (Patient taking differently: Take 2,000 mg by mouth 2 (two) times daily. ) 120 tablet 3  . testosterone cypionate (DEPOTESTOTERONE CYPIONATE) 200 MG/ML injection inject 1.59ml im every 2 weeks as directed 10 mL 1  . traMADol (ULTRAM) 50 MG tablet Take 1 tablet 4 x day if needed for pains (Patient taking differently: Take 50 mg by mouth 4 (four) times daily as needed for moderate pain. ) 50 tablet 0   No current facility-administered medications on file prior to visit.   Health Maintenance:  Immunization History  Administered Date(s) Administered  . DTaP 04/02/2012  . Influenza Split 02/17/2014  . Pneumococcal Polysaccharide-23 04/02/2012   Tetanus: 2013 Pneumovax: 2013 Prevnar 56: due age 89 Flu vaccine: 2015 Zostavax: N/A DEXA: N/A Colonoscopy: DUE this year, has not had but wants to wait until after grad school EGD: N/A  Eye exam 08/2014, Dr. Sabra Heck Dentist: Narda Amber  smiles Urologist: Dr. Louis Meckel Endocrinologist: Dr. Cruzita Lederer  Allergies: No Known Allergies Medical History:  Past Medical History  Diagnosis Date  . Hypogonadism male   . Right bundle branch block   . OSA on CPAP   . Type 2 diabetes mellitus   . Left ureteral stone   . History of kidney stones    Surgical History:  Past Surgical History  Procedure Laterality Date  . Ankle surgery Left 2002  . Rhinoplasty  2001  . Extracorporeal shock wave lithotripsy Left 09-25-2014  . Cystoscopy/retrograde/ureteroscopy/stone extraction with basket Left 12/05/2014    Procedure: LEFT  URETEROSCOPY, STONE EXTRACTION ;  Surgeon: Ardis Hughs, MD;  Location: [facility];  Service: Urology;  Laterality: Left;  . Cystoscopy w/ retrogrades Left 12/05/2014    Procedure: CYSTOSCOPY WITH RETROGRADE PYELOGRAM;  Surgeon: Ardis Hughs, MD;  Location: [facility];  Service: Urology;  Laterality: Left;   Family History:  Family History  Problem Relation Age of Onset  . Hypertension Mother   . Cancer Mother     breast cancer  . Diabetes Father    Social  History:   Social History  Substance Use Topics  . Smoking status: Never Smoker   . Smokeless tobacco: Never Used  . Alcohol Use: Yes     Comment: RARE   Review of Systems  Constitutional: Positive for malaise/fatigue. Negative for fever, chills, weight loss and diaphoresis.  HENT: Negative.   Eyes: Negative.   Respiratory: Negative.  Negative for shortness of breath.   Cardiovascular: Negative.  Negative for chest pain and claudication.  Gastrointestinal: Negative.  Negative for abdominal pain, diarrhea, constipation, blood in stool and melena.  Genitourinary: Negative.   Musculoskeletal: Negative.   Skin: Negative.   Neurological: Negative.  Negative for weakness.  Psychiatric/Behavioral: Negative.    Physical Exam: Estimated body mass index is 38.21 kg/(m^2) as calculated from the following:   Height as  of this encounter: 6' 1.5" (1.867 m).   Weight as of this encounter: 293 lb 9.6 oz (133.176 kg). BP 116/76 mmHg  Pulse 80  Temp(Src) 98.1 F (36.7 C)  Resp 16  Ht 6' 1.5" (1.867 m)  Wt 293 lb 9.6 oz (133.176 kg)  BMI 38.21 kg/m2 General Appearance: Well nourished, in no apparent distress. Eyes: PERRLA, EOMs, conjunctiva no swelling or erythema, normal fundi and vessels. Sinuses: No Frontal/maxillary tenderness ENT/Mouth: Ext aud canals clear, normal light reflex with TMs without erythema, bulging. Good dentition. No erythema, swelling, or exudate on post pharynx. Tonsils not swollen or erythematous. Hearing normal.  Neck: Supple, thyroid normal. No bruits Respiratory: Respiratory effort normal, BS equal bilaterally without rales, rhonchi, wheezing or stridor. Cardio: RRR without murmurs, rubs or gallops. Brisk peripheral pulses without edema.  Chest: symmetric, with normal excursions and percussion. Abdomen: Soft, +BS. Non tender, no guarding, rebound, hernias, masses, or organomegaly. .  Lymphatics: Non tender without lymphadenopathy.  Genitourinary: defer urologist Musculoskeletal: Full ROM all peripheral extremities,5/5 strength, and normal gait. Skin: Warm, dry without rashes, lesions, ecchymosis.  Neuro: Cranial nerves intact, reflexes equal bilaterally. Normal muscle tone, no cerebellar symptoms. Sensation intact.  Psych: Awake and oriented X 3, normal affect, Insight and Judgment appropriate.   EKG: CRBBB, occ PVC, no ST changes AORTA SCAN: normal   Vicie Mutters 9:31 AM

## 2014-12-12 NOTE — Patient Instructions (Addendum)
Use a dropper or use a cap to put olive oil,mineral oil or canola oil in the effected ear- 2-3 times a week. Let it soak for 20-30 min then you can take a shower or use a baby bulb with warm water to wash out the ear wax.  Do not use Qtips  Diabetes is a very complicated disease...lets simplify it.  An easy way to look at it to understand the complications is if you think of the extra sugar floating in your blood stream as glass shards floating through your blood stream.    Diabetes affects your small vessels first: 1) The glass shards (sugar) scraps down the tiny blood vessels in your eyes and lead to diabetic retinopathy, the leading cause of blindness in the [place]. Diabetes is the leading cause of newly diagnosed adult (52 to 52 years of age) blindness in the [place].  2) The glass shards scratches down the tiny vessels of your legs leading to nerve damage called neuropathy and can lead to amputations of your feet. More than 60% of all non-traumatic amputations of lower limbs occur in people with diabetes.  3) Over time the small vessels in your brain are shredded and closed off, individually this does not cause any problems but over a long period of time many of the small vessels being blocked can lead to Vascular Dementia.   4) Your kidney's are a filter system and have a "net" that keeps certain things in the body and lets bad things out. Sugar shreds this net and leads to kidney damage and eventually failure. Decreasing the sugar that is destroying the net and certain blood pressure medications can help stop or decrease progression of kidney disease. Diabetes was the primary cause of kidney failure in 44 percent of all new cases in 52 2011.  5) Diabetes also destroys the small vessels in your penis that lead to erectile dysfunction. Eventually the vessels are so damaged that you may not be responsive to cialis or viagra.   Diabetes and your large vessels: Your larger vessels consist of your  coronary arteries in your heart and the carotid vessels to your brain. Diabetes or even increased sugars put you at 300% increased risk of heart attack and stroke and this is why.. The sugar scrapes down your large blood vessels and your body sees this as an internal injury and tries to repair itself. Just like you get a scab on your skin, your platelets will stick to the blood vessel wall trying to heal it. This is why we have diabetics on low dose aspirin daily, this prevents the platelets from sticking and can prevent plaque formation. In addition, your body takes cholesterol and tries to shove it into the open wound. This is why we want your LDL, or bad cholesterol, below 70.   The combination of platelets and cholesterol over 5-10 years forms plaque that can break off and cause a heart attack or stroke.   PLEASE REMEMBER:  Diabetes is preventable! Up to 36 percent of complications and morbidities among individuals with type 2 diabetes can be prevented, delayed, or effectively treated and minimized with regular visits to a health professional, appropriate monitoring and medication, and a healthy diet and lifestyle.  Your A1C is a measure of your sugar over the past 3 months and is not affected by what you have eaten over the past few days. Diabetes increases your chances of stroke and heart attack over 300 % and is the leading cause of  blindness and kidney failure in the [place]. Please make sure you decrease bad carbs like white bread, white rice, potatoes, corn, soft drinks, pasta, cereals, refined sugars, sweet tea, dried fruits, and fruit juice. Good carbs are okay to eat in moderation like sweet potatoes, brown rice, whole grain pasta/bread, most fruit (except dried fruit) and you can eat as many veggies as you want.   Greater than 6.5 is considered diabetic. Between 6.4 and 5.7 is prediabetic If your A1C is less than 5.7 you are NOT diabetic.  Targets for Glucose Readings: Time of Check  Target for patients WITHOUT Diabetes Target for DIABETICS  Before Meals Less than 100  less than 150  Two hours after meals Less than 200  Less than 250    Recommendations For Diabetic/Prediabetic Patients:   -  Take medications as prescribed  -  Recommend Dr Fara Olden Fuhrman's book "The End of Diabetes "  And "The End of Dieting"- Can get at  www.[employer].com and encourage also get the Audio CD book  - AVOID Animal products, ie. Meat - red/white, Poultry and Dairy/especially cheese - Exercise at least 5 times a week for 30 minutes or preferably daily.  - No Smoking - Drink less than 2 drinks a day.  - Monitor your feet for sores - Have yearly Eye Exams - Recommend annual Flu vaccine  - Recommend Pneumovax and Prevnar vaccines - Shingles Vaccine (Zostavax) if over 35 y.o.  Goals:   - BMI less than 24 - Fasting sugar less than 130 or less than 150 if tapering medicines to lose weight  - Systolic BP less than 060  - Diastolic BP less than 80 - Bad LDL Cholesterol less than 70 - Triglycerides less than 150

## 2014-12-13 LAB — MICROALBUMIN / CREATININE URINE RATIO
CREATININE, URINE: 94.4 mg/dL
MICROALB UR: 8.6 mg/dL — AB (ref <2.0)
Microalb Creat Ratio: 91.1 mg/g — ABNORMAL HIGH (ref 0.0–30.0)

## 2014-12-13 LAB — INSULIN, FASTING: Insulin fasting, serum: 3 u[IU]/mL (ref 2.0–19.6)

## 2014-12-18 ENCOUNTER — Other Ambulatory Visit: Payer: Self-pay | Admitting: Internal Medicine

## 2015-01-13 ENCOUNTER — Ambulatory Visit: Payer: Self-pay | Admitting: Physician Assistant

## 2015-01-26 ENCOUNTER — Other Ambulatory Visit: Payer: Self-pay | Admitting: Internal Medicine

## 2015-02-01 ENCOUNTER — Other Ambulatory Visit: Payer: Self-pay | Admitting: Internal Medicine

## 2015-02-03 ENCOUNTER — Telehealth: Payer: Self-pay | Admitting: *Deleted

## 2015-02-03 NOTE — Telephone Encounter (Signed)
Patient requested an increase in his month supply of Viagra 100 mg from #8 to 30. Per Dr Eddie North, no precert needed because insurance does not allow large quantities of this drug. Patient has 100 mg tabs which he can cut in half to equal 16 days out of 30.  Pharmacist aware.

## 2015-03-04 ENCOUNTER — Telehealth: Payer: Self-pay | Admitting: *Deleted

## 2015-03-04 NOTE — Telephone Encounter (Signed)
Called the patient and his pharmacy to inform them we cannot get get a larger amount than # 8 Viagra per month.  Per Dr Melford Aase, patient can get from the [facility] for less expensive price.  Patient aware of information.

## 2015-03-31 ENCOUNTER — Encounter: Payer: Self-pay | Admitting: Physician Assistant

## 2015-03-31 ENCOUNTER — Ambulatory Visit (INDEPENDENT_AMBULATORY_CARE_PROVIDER_SITE_OTHER): Payer: BLUE CROSS/BLUE SHIELD | Admitting: Physician Assistant

## 2015-03-31 VITALS — BP 118/72 | HR 67 | Temp 97.7°F | Resp 16 | Ht 73.5 in | Wt 290.0 lb

## 2015-03-31 DIAGNOSIS — E559 Vitamin D deficiency, unspecified: Secondary | ICD-10-CM

## 2015-03-31 DIAGNOSIS — I10 Essential (primary) hypertension: Secondary | ICD-10-CM

## 2015-03-31 DIAGNOSIS — E782 Mixed hyperlipidemia: Secondary | ICD-10-CM | POA: Diagnosis not present

## 2015-03-31 DIAGNOSIS — E1122 Type 2 diabetes mellitus with diabetic chronic kidney disease: Secondary | ICD-10-CM | POA: Diagnosis not present

## 2015-03-31 DIAGNOSIS — Z79899 Other long term (current) drug therapy: Secondary | ICD-10-CM

## 2015-03-31 DIAGNOSIS — E291 Testicular hypofunction: Secondary | ICD-10-CM | POA: Diagnosis not present

## 2015-03-31 DIAGNOSIS — N183 Chronic kidney disease, stage 3 (moderate): Secondary | ICD-10-CM | POA: Diagnosis not present

## 2015-03-31 DIAGNOSIS — E669 Obesity, unspecified: Secondary | ICD-10-CM | POA: Diagnosis not present

## 2015-03-31 DIAGNOSIS — E349 Endocrine disorder, unspecified: Secondary | ICD-10-CM

## 2015-03-31 LAB — CBC WITH DIFFERENTIAL/PLATELET
Basophils Absolute: 0.1 10*3/uL (ref 0.0–0.1)
Basophils Relative: 1 % (ref 0–1)
Eosinophils Absolute: 0.2 10*3/uL (ref 0.0–0.7)
Eosinophils Relative: 3 % (ref 0–5)
HCT: 56.3 % — ABNORMAL HIGH (ref 39.0–52.0)
Hemoglobin: 19 g/dL — ABNORMAL HIGH (ref 13.0–17.0)
LYMPHS ABS: 2.2 10*3/uL (ref 0.7–4.0)
LYMPHS PCT: 41 % (ref 12–46)
MCH: 29.1 pg (ref 26.0–34.0)
MCHC: 33.7 g/dL (ref 30.0–36.0)
MCV: 86.1 fL (ref 78.0–100.0)
MONOS PCT: 6 % (ref 3–12)
MPV: 11 fL (ref 8.6–12.4)
Monocytes Absolute: 0.3 10*3/uL (ref 0.1–1.0)
NEUTROS PCT: 49 % (ref 43–77)
Neutro Abs: 2.6 10*3/uL (ref 1.7–7.7)
Platelets: 230 10*3/uL (ref 150–400)
RBC: 6.54 MIL/uL — ABNORMAL HIGH (ref 4.22–5.81)
RDW: 13.9 % (ref 11.5–15.5)
WBC: 5.4 10*3/uL (ref 4.0–10.5)

## 2015-03-31 LAB — MAGNESIUM: Magnesium: 2 mg/dL (ref 1.5–2.5)

## 2015-03-31 LAB — BASIC METABOLIC PANEL WITH GFR
BUN: 20 mg/dL (ref 7–25)
CALCIUM: 10.5 mg/dL — AB (ref 8.6–10.3)
CHLORIDE: 101 mmol/L (ref 98–110)
CO2: 28 mmol/L (ref 20–31)
Creat: 1.77 mg/dL — ABNORMAL HIGH (ref 0.70–1.33)
GFR, EST AFRICAN AMERICAN: 50 mL/min — AB (ref >=60)
GFR, Est Non African American: 43 mL/min — ABNORMAL LOW (ref >=60)
GLUCOSE: 202 mg/dL — AB (ref 65–99)
POTASSIUM: 4.8 mmol/L (ref 3.5–5.3)
Sodium: 137 mmol/L (ref 135–146)

## 2015-03-31 LAB — HEPATIC FUNCTION PANEL
ALK PHOS: 135 U/L — AB (ref 40–115)
ALT: 24 U/L (ref 9–46)
AST: 15 U/L (ref 10–35)
Albumin: 4.4 g/dL (ref 3.6–5.1)
BILIRUBIN INDIRECT: 0.9 mg/dL (ref 0.2–1.2)
Bilirubin, Direct: 0.2 mg/dL (ref <=0.2)
Total Bilirubin: 1.1 mg/dL (ref 0.2–1.2)
Total Protein: 6.9 g/dL (ref 6.1–8.1)

## 2015-03-31 LAB — LIPID PANEL
CHOL/HDL RATIO: 3.4 ratio (ref <=5.0)
Cholesterol: 148 mg/dL (ref 125–200)
HDL: 44 mg/dL (ref >=40)
LDL CALC: 62 mg/dL (ref <130)
TRIGLYCERIDES: 211 mg/dL — AB (ref <150)
VLDL: 42 mg/dL — ABNORMAL HIGH (ref <30)

## 2015-03-31 NOTE — Progress Notes (Signed)
Assessment and Plan:  Hypertension: Continue medication, monitor blood pressure at home. Continue DASH diet. Cholesterol: Continue diet and exercise. Check cholesterol.  Diabetes with CKD-Continue diet and exercise. Check A1C Vitamin D Def- check level and continue medications.   Continue diet and meds as discussed. Further disposition pending results of labs. Discussed med's effects and SE's.    HPI 54 y.o. male  presents for 3 month follow up with hypertension, hyperlipidemia, diabetes and vitamin D. His blood pressure has been controlled at home, today their BP is BP: 118/72 mmHg He does workout, he is in the gym 3 days a week and outside a lot. He denies chest pain, shortness of breath, dizziness.  He is not on cholesterol medication and denies myalgias. His cholesterol is at goal. The cholesterol last visit was:   Lab Results  Component Value Date   CHOL 157 12/12/2014   HDL 42 12/12/2014   LDLCALC 73 12/12/2014   TRIG 212* 12/12/2014   CHOLHDL 3.7 12/12/2014   He has been working on diet and exercise for Diabetes with CKD, he is on an ACE, he is now on Invokana, Victoza, and metformin, he is seeing endocrinology, he is on bASA, he does check his sugars but is finishing [employer] and so this is difficult, last class in Jan. Mother in law moved in with them from [place] this week, and denies paresthesia of the feet, polydipsia and polyuria. Last A1C in the office was:  Lab Results  Component Value Date   HGBA1C 10.5* 12/12/2014   Lab Results  Component Value Date   GFRAA 53* 12/12/2014   Patient is on Vitamin D supplement. Lab Results  Component Value Date   VD25OH 47 12/12/2014     He is has a history of testosterone deficiency and is on testosterone replacement, last shot was 1 week ago, does q 2 weeks. He states that the testosterone helps with his energy, libido, muscle mass. Lab Results  Component Value Date   TESTOSTERONE 260* 12/12/2014   BMI is Body mass index is  37.74 kg/(m^2)., he is working on diet and exercise. Wt Readings from Last 3 Encounters:  03/31/15 290 lb (131.543 kg)  12/12/14 293 lb 9.6 oz (133.176 kg)  12/05/14 291 lb (131.997 kg)    Current Medications:  Current Outpatient Prescriptions on File Prior to Visit  Medication Sig Dispense Refill  . aspirin EC 81 MG tablet Take 81 mg by mouth daily.    . Cholecalciferol (VITAMIN D-3) 5000 UNITS TABS Take 5,000 Units by mouth daily.    Marland Kitchen ibuprofen (ADVIL,MOTRIN) 200 MG tablet Take 400 mg by mouth every 6 (six) hours as needed for moderate pain.    . INVOKANA 300 MG TABS tablet TAKE 1 TABLET BY MOUTH DAILY. 30 tablet 3  . lisinopril (PRINIVIL,ZESTRIL) 20 MG tablet Take 1 tablet (20 mg total) by mouth daily. (Patient taking differently: Take 20 mg by mouth every morning. ) 30 tablet 3  . metFORMIN (GLUCOPHAGE-XR) 500 MG 24 hr tablet Take 4 tablets (2,000 mg total) by mouth daily. (Patient taking differently: Take 2,000 mg by mouth 2 (two) times daily. ) 120 tablet 3  . testosterone cypionate (DEPOTESTOSTERONE CYPIONATE) 200 MG/ML injection INJECT 1.5 ML IM EVERY 2 WEEKS 10 mL 1  . traMADol (ULTRAM) 50 MG tablet Take 1 tablet 4 x day if needed for pains (Patient taking differently: Take 50 mg by mouth 4 (four) times daily as needed for moderate pain. ) 50 tablet 0  .  VICTOZA 18 MG/3ML SOPN INJECT 1.8 MGS DAILY AS DIRECTED 9 pen 3   No current facility-administered medications on file prior to visit.   Medical History:  Past Medical History  Diagnosis Date  . Hypogonadism male   . Right bundle branch block   . OSA on CPAP   . Type 2 diabetes mellitus ([place])   . Left ureteral stone   . History of kidney stones    Allergies: No Known Allergies   Review of Systems  Constitutional: Positive for malaise/fatigue. Negative for fever, chills, weight loss and diaphoresis.  HENT: Negative.   Eyes: Negative.   Respiratory: Negative.  Negative for shortness of breath.   Cardiovascular:  Negative.  Negative for chest pain and claudication.  Gastrointestinal: Negative.  Negative for abdominal pain, diarrhea, constipation, blood in stool and melena.  Genitourinary: Negative.   Musculoskeletal: Negative.   Skin: Negative.   Neurological: Negative.  Negative for weakness.  Psychiatric/Behavioral: Negative.      Family history- Review and unchanged Social history- Review and unchanged Physical Exam: BP 118/72 mmHg  Pulse 67  Temp(Src) 97.7 F (36.5 C) (Temporal)  Resp 16  Ht 6' 1.5" (1.867 m)  Wt 290 lb (131.543 kg)  BMI 37.74 kg/m2  SpO2 95% Wt Readings from Last 3 Encounters:  03/31/15 290 lb (131.543 kg)  12/12/14 293 lb 9.6 oz (133.176 kg)  12/05/14 291 lb (131.997 kg)   General Appearance: Well nourished, in no apparent distress. Eyes: PERRLA, EOMs, conjunctiva no swelling or erythema Sinuses: No Frontal/maxillary tenderness ENT/Mouth: Ext aud canals clear, TMs without erythema, bulging. No erythema, swelling, or exudate on post pharynx.  Tonsils not swollen or erythematous. Hearing normal.  Neck: Supple, thyroid normal.  Respiratory: Respiratory effort normal, BS equal bilaterally without rales, rhonchi, wheezing or stridor.  Cardio: RRR with no MRGs. Brisk peripheral pulses without edema.  Abdomen: Soft, + BS.  Non tender, no guarding, rebound, hernias, masses. Lymphatics: Non tender without lymphadenopathy.  Musculoskeletal: Full ROM, 5/5 strength, normal gait.  Skin: Warm, dry without rashes, lesions, ecchymosis.  Neuro: Cranial nerves intact. No cerebellar symptoms. Sensation intact.  Psych: Awake and oriented X 3, normal affect, Insight and Judgment appropriate.    Vicie Mutters 4:37 PM

## 2015-03-31 NOTE — Patient Instructions (Signed)
Diabetes is a very complicated disease...lets simplify it.  An easy way to look at it to understand the complications is if you think of the extra sugar floating in your blood stream as glass shards floating through your blood stream.    Diabetes affects your small vessels first: 1) The glass shards (sugar) scraps down the tiny blood vessels in your eyes and lead to diabetic retinopathy, the leading cause of blindness in the [place]. Diabetes is the leading cause of newly diagnosed adult (20 to 52 years of age) blindness in the [place].  2) The glass shards scratches down the tiny vessels of your legs leading to nerve damage called neuropathy and can lead to amputations of your feet. More than 60% of all non-traumatic amputations of lower limbs occur in people with diabetes.  3) Over time the small vessels in your brain are shredded and closed off, individually this does not cause any problems but over a long period of time many of the small vessels being blocked can lead to Vascular Dementia.   4) Your kidney's are a filter system and have a "net" that keeps certain things in the body and lets bad things out. Sugar shreds this net and leads to kidney damage and eventually failure. Decreasing the sugar that is destroying the net and certain blood pressure medications can help stop or decrease progression of kidney disease. Diabetes was the primary cause of kidney failure in 44 percent of all new cases in 2011.  5) Diabetes also destroys the small vessels in your penis that lead to erectile dysfunction. Eventually the vessels are so damaged that you may not be responsive to cialis or viagra.   Diabetes and your large vessels: Your larger vessels consist of your coronary arteries in your heart and the carotid vessels to your brain. Diabetes or even increased sugars put you at 300% increased risk of heart attack and stroke and this is why.. The sugar scrapes down your large blood vessels and your body  sees this as an internal injury and tries to repair itself. Just like you get a scab on your skin, your platelets will stick to the blood vessel wall trying to heal it. This is why we have diabetics on low dose aspirin daily, this prevents the platelets from sticking and can prevent plaque formation. In addition, your body takes cholesterol and tries to shove it into the open wound. This is why we want your LDL, or bad cholesterol, below 70.   The combination of platelets and cholesterol over 5-10 years forms plaque that can break off and cause a heart attack or stroke.   PLEASE REMEMBER:  Diabetes is preventable! Up to 85 percent of complications and morbidities among individuals with type 2 diabetes can be prevented, delayed, or effectively treated and minimized with regular visits to a health professional, appropriate monitoring and medication, and a healthy diet and lifestyle.     Bad carbs also include fruit juice, alcohol, and sweet tea. These are empty calories that do not signal to your brain that you are full.   Please remember the good carbs are still carbs which convert into sugar. So please measure them out no more than 1/2-1 cup of rice, oatmeal, pasta, and beans  Veggies are however free foods! Pile them on.   Not all fruit is created equal. Please see the list below, the fruit at the bottom is higher in sugars than the fruit at the top. Please avoid all dried fruits.       Recommendations For Diabetic/Prediabetic Patients:   -  Take medications as prescribed  -  Recommend Dr Fara Olden Fuhrman's book "The End of Diabetes "  And "The End of Dieting"- Can get at  www.[place].com and encourage also get the Audio CD book  - AVOID Animal products, ie. Meat - red/white, Poultry and Dairy/especially cheese - Exercise at least 5 times a week for 30 minutes or preferably daily.  - No Smoking - Drink less than 2 drinks a day.  - Monitor your feet for sores - Have yearly Eye Exams - Recommend  annual Flu vaccine  - Recommend Pneumovax and Prevnar vaccines - Shingles Vaccine (Zostavax) if over 16 y.o.  Goals:   - BMI less than 24 - Fasting sugar less than 130 or less than 150 if tapering medicines to lose weight  - Systolic BP less than [id number]  - Diastolic BP less than 80 - Bad LDL Cholesterol less than 70 - Triglycerides less than 150

## 2015-04-01 LAB — HEMOGLOBIN A1C
HEMOGLOBIN A1C: 10.6 % — AB (ref <5.7)
Mean Plasma Glucose: 258 mg/dL — ABNORMAL HIGH (ref <117)

## 2015-04-01 LAB — TSH: TSH: 0.811 u[IU]/mL (ref 0.350–4.500)

## 2015-04-01 LAB — VITAMIN D 25 HYDROXY (VIT D DEFICIENCY, FRACTURES): Vit D, 25-Hydroxy: 54 ng/mL (ref 30–100)

## 2015-04-26 ENCOUNTER — Other Ambulatory Visit: Payer: Self-pay | Admitting: Physician Assistant

## 2015-04-30 ENCOUNTER — Other Ambulatory Visit: Payer: Self-pay | Admitting: Physician Assistant

## 2015-04-30 ENCOUNTER — Encounter: Payer: Self-pay | Admitting: Physician Assistant

## 2015-04-30 ENCOUNTER — Ambulatory Visit (INDEPENDENT_AMBULATORY_CARE_PROVIDER_SITE_OTHER): Payer: BLUE CROSS/BLUE SHIELD | Admitting: Physician Assistant

## 2015-04-30 ENCOUNTER — Other Ambulatory Visit: Payer: Self-pay

## 2015-04-30 VITALS — BP 126/78 | HR 79 | Temp 98.1°F | Resp 16 | Ht 73.5 in | Wt 293.0 lb

## 2015-04-30 DIAGNOSIS — E1122 Type 2 diabetes mellitus with diabetic chronic kidney disease: Secondary | ICD-10-CM

## 2015-04-30 DIAGNOSIS — N183 Chronic kidney disease, stage 3 (moderate): Secondary | ICD-10-CM

## 2015-04-30 DIAGNOSIS — N182 Chronic kidney disease, stage 2 (mild): Secondary | ICD-10-CM | POA: Diagnosis not present

## 2015-04-30 DIAGNOSIS — I10 Essential (primary) hypertension: Secondary | ICD-10-CM

## 2015-04-30 LAB — HEPATIC FUNCTION PANEL
ALBUMIN: 4.4 g/dL (ref 3.6–5.1)
ALK PHOS: 82 U/L (ref 40–115)
ALT: 22 U/L (ref 9–46)
AST: 15 U/L (ref 10–35)
BILIRUBIN INDIRECT: 1.1 mg/dL (ref 0.2–1.2)
Bilirubin, Direct: 0.3 mg/dL — ABNORMAL HIGH (ref <=0.2)
TOTAL PROTEIN: 6.9 g/dL (ref 6.1–8.1)
Total Bilirubin: 1.4 mg/dL — ABNORMAL HIGH (ref 0.2–1.2)

## 2015-04-30 LAB — CBC WITH DIFFERENTIAL/PLATELET
BASOS ABS: 0 10*3/uL (ref 0.0–0.1)
Basophils Relative: 0 % (ref 0–1)
Eosinophils Absolute: 0.2 10*3/uL (ref 0.0–0.7)
Eosinophils Relative: 3 % (ref 0–5)
HCT: 55.1 % — ABNORMAL HIGH (ref 39.0–52.0)
HEMOGLOBIN: 18.9 g/dL — AB (ref 13.0–17.0)
LYMPHS PCT: 41 % (ref 12–46)
Lymphs Abs: 2.1 10*3/uL (ref 0.7–4.0)
MCH: 29.3 pg (ref 26.0–34.0)
MCHC: 34.3 g/dL (ref 30.0–36.0)
MCV: 85.3 fL (ref 78.0–100.0)
MPV: 10.1 fL (ref 8.6–12.4)
Monocytes Absolute: 0.4 10*3/uL (ref 0.1–1.0)
Monocytes Relative: 7 % (ref 3–12)
NEUTROS ABS: 2.5 10*3/uL (ref 1.7–7.7)
Neutrophils Relative %: 49 % (ref 43–77)
Platelets: 213 10*3/uL (ref 150–400)
RBC: 6.46 MIL/uL — AB (ref 4.22–5.81)
RDW: 13.6 % (ref 11.5–15.5)
WBC: 5.2 10*3/uL (ref 4.0–10.5)

## 2015-04-30 LAB — BASIC METABOLIC PANEL WITH GFR
BUN: 14 mg/dL (ref 7–25)
CHLORIDE: 104 mmol/L (ref 98–110)
CO2: 23 mmol/L (ref 20–31)
Calcium: 9.8 mg/dL (ref 8.6–10.3)
Creat: 1.53 mg/dL — ABNORMAL HIGH (ref 0.70–1.33)
GFR, EST NON AFRICAN AMERICAN: 52 mL/min — AB (ref >=60)
GFR, Est African American: 60 mL/min (ref >=60)
GLUCOSE: 115 mg/dL — AB (ref 65–99)
POTASSIUM: 4.1 mmol/L (ref 3.5–5.3)
Sodium: 142 mmol/L (ref 135–146)

## 2015-04-30 MED ORDER — EMPAGLIFLOZIN 25 MG PO TABS: 25.0000 mg | ORAL_TABLET | Freq: Every day | ORAL | Status: DC

## 2015-04-30 NOTE — Progress Notes (Signed)
Diabetes Education and Follow-Up Visit  54 y.o.male presents for diabetic education. He denies hypoglycemia , paresthesia of the feet, polydipsia, polyuria and visual disturbances. The patient is on an ACE/ARB, he is on a low dose aspirin at this time. The patient does exercise, has increased since last A1C. He does not currently smoke, and does not currently drink alcohol.  The patient is checking his sugars at home. He is on invokana 300, metformin 500 (2000mg ) a day, and victoza. Highest sugar is 160, lowest is 123. His kidney function has also gone from GFR of 70 to 50 in over a year, he is on invokana 300 at this time.   Current Outpatient Prescriptions on File Prior to Visit  Medication Sig Dispense Refill  . aspirin EC 81 MG tablet Take 81 mg by mouth daily.    . Cholecalciferol (VITAMIN D-3) 5000 UNITS TABS Take 5,000 Units by mouth daily.    Marland Kitchen ibuprofen (ADVIL,MOTRIN) 200 MG tablet Take 400 mg by mouth every 6 (six) hours as needed for moderate pain.    . INVOKANA 300 MG TABS tablet TAKE 1 TABLET BY MOUTH DAILY. 30 tablet 3  . lisinopril (PRINIVIL,ZESTRIL) 20 MG tablet Take 1 tablet (20 mg total) by mouth daily. (Patient taking differently: Take 20 mg by mouth every morning. ) 30 tablet 3  . metFORMIN (GLUCOPHAGE-XR) 500 MG 24 hr tablet Take 4 tablets (2,000 mg total) by mouth daily. (Patient taking differently: Take 2,000 mg by mouth 2 (two) times daily. ) 120 tablet 3  . testosterone cypionate (DEPOTESTOSTERONE CYPIONATE) 200 MG/ML injection INJECT 1.5 ML IM EVERY 2 WEEKS 10 mL 1  . traMADol (ULTRAM) 50 MG tablet Take 1 tablet 4 x day if needed for pains (Patient taking differently: Take 50 mg by mouth 4 (four) times daily as needed for moderate pain. ) 50 tablet 0  . VICTOZA 18 MG/3ML SOPN INJECT 1.8 MGS DAILY AS DIRECTED 9 pen 2   No current facility-administered medications on file prior to visit.   Past Medical History  Diagnosis Date  . Hypogonadism male   . Right bundle branch  block   . OSA on CPAP   . Type 2 diabetes mellitus ([place])   . Left ureteral stone   . History of kidney stones    ROS: no polyuria or polydipsia, no chest pain, dyspnea or TIA's, no numbness, tingling or pain in extremities  Physical Exam: Blood pressure 126/78, pulse 79, temperature 98.1 F (36.7 C), temperature source Temporal, resp. rate 16, height 6' 1.5" (1.867 m), weight 293 lb (132.904 kg), SpO2 98 %. Body mass index is 38.13 kg/(m^2). General Appearance:  alert, oriented, no acute distress fundi normal, heart sounds regular rate and rhythm, S1, S2 normal, no murmur, click, rub or gallop, chest clear, no hepatosplenomegaly, feet: normal DP and PT pulses, no trophic changes or ulcerative lesions and normal sensory exam  Labs: Lab Results  Component Value Date   HGBA1C 10.6* 03/31/2015    Lab Results  Component Value Date   MICROALBUR 8.6* 12/12/2014    Lab Results  Component Value Date   CHOL 148 03/31/2015   HDL 44 03/31/2015   LDLCALC 62 03/31/2015   TRIG 211* 03/31/2015   CHOLHDL 3.4 03/31/2015    Lab Results  Component Value Date   GFRAA 50* 03/31/2015    Plan and Assessment: Diabetes Mellitus type 2:  Diabetes mellitus Type II, under good control. Diabetes related complications: nephropathy Education: Reviewed 'ABCs' of diabetes management (respective  goals in parentheses):  A1C (<7), blood pressure (<130/80), and cholesterol (LDL <100) Eye Exam yearly and Dental Exam every 6 months. Dietary recommendations Physical Activity recommendations  CKD Will check labs to rule out other causes If GFR is still below 60 will switch to jardiance 25mg  daily   Blood pressure: normal blood pressure.   An ACE/ARB is currently part of their treatment regimen.  LDL goal of < 100, HDL > 40 and TG < 150. Dyslipidemia under excellent control. A statin is not currently part of their treatment regimen.  Future Appointments Date Time Provider [place]  08/05/2015  4:30 PM Vicie Mutters, PA-C [id number] None

## 2015-04-30 NOTE — Patient Instructions (Signed)
Diabetes is a very complicated disease...lets simplify it.  An easy way to look at it to understand the complications is if you think of the extra sugar floating in your blood stream as glass shards floating through your blood stream.    Diabetes affects your small vessels first: 1) The glass shards (sugar) scraps down the tiny blood vessels in your eyes and lead to diabetic retinopathy, the leading cause of blindness in the [place]. Diabetes is the leading cause of newly diagnosed adult (62 to 52 years of age) blindness in the [place].  2) The glass shards scratches down the tiny vessels of your legs leading to nerve damage called neuropathy and can lead to amputations of your feet. More than 60% of all non-traumatic amputations of lower limbs occur in people with diabetes.  3) Over time the small vessels in your brain are shredded and closed off, individually this does not cause any problems but over a long period of time many of the small vessels being blocked can lead to Vascular Dementia.   4) Your kidney's are a filter system and have a "net" that keeps certain things in the body and lets bad things out. Sugar shreds this net and leads to kidney damage and eventually failure. Decreasing the sugar that is destroying the net and certain blood pressure medications can help stop or decrease progression of kidney disease. Diabetes was the primary cause of kidney failure in 44 percent of all new cases in 2011.  5) Diabetes also destroys the small vessels in your penis that lead to erectile dysfunction. Eventually the vessels are so damaged that you may not be responsive to cialis or viagra.   Diabetes and your large vessels: Your larger vessels consist of your coronary arteries in your heart and the carotid vessels to your brain. Diabetes or even increased sugars put you at 300% increased risk of heart attack and stroke and this is why.. The sugar scrapes down your large blood vessels and your body  sees this as an internal injury and tries to repair itself. Just like you get a scab on your skin, your platelets will stick to the blood vessel wall trying to heal it. This is why we have diabetics on low dose aspirin daily, this prevents the platelets from sticking and can prevent plaque formation. In addition, your body takes cholesterol and tries to shove it into the open wound. This is why we want your LDL, or bad cholesterol, below 70.   The combination of platelets and cholesterol over 5-10 years forms plaque that can break off and cause a heart attack or stroke.   PLEASE REMEMBER:  Diabetes is preventable! Up to 44 percent of complications and morbidities among individuals with type 2 diabetes can be prevented, delayed, or effectively treated and minimized with regular visits to a health professional, appropriate monitoring and medication, and a healthy diet and lifestyle.  Chronic Kidney Disease Chronic kidney disease occurs when the kidneys are damaged over a long period. The kidneys are two organs that lie on either side of the spine between the middle of the back and the front of the abdomen. The kidneys:  Remove wastes and extra water from the blood.  Produce important hormones. These help keep bones strong, regulate blood pressure, and help create red blood cells.  Balance the fluids and chemicals in the blood and tissues. A small amount of kidney damage may not cause problems, but a large amount of damage may make it  difficult or impossible for the kidneys to work the way they should. If steps are not taken to slow down the kidney damage or stop it from getting worse, the kidneys may stop working permanently. Most of the time, chronic kidney disease does not go away. However, it can often be controlled, and those with the disease can usually live normal lives. CAUSES The most common causes of chronic kidney disease are diabetes and high blood pressure (hypertension). Chronic kidney  disease may also be caused by:  Diseases that cause the kidneys' filters to become inflamed.  Diseases that affect the immune system.  Genetic diseases.  Medicines that damage the kidneys, such as anti-inflammatory medicines.  Poisoning or exposure to toxic substances.  A reoccurring kidney or urinary infection.  A problem with urine flow. This may be caused by:  Cancer.  Kidney stones.  An enlarged prostate in males. SIGNS AND SYMPTOMS Because the kidney damage in chronic kidney disease occurs slowly, symptoms develop slowly and may not be obvious until the kidney damage becomes severe. A person may have a kidney disease for years without showing any symptoms. Symptoms can include:  Swelling (edema) of the legs, ankles, or feet.  Tiredness (lethargy).  Nausea or vomiting.  Confusion.  Problems with urination, such as:  Decreased urine production.  Frequent urination, especially at night.  Frequent accidents in children who are potty trained.  Muscle twitches and cramps.  Shortness of breath.  Weakness.  Persistent itchiness.  Loss of appetite.  Metallic taste in the mouth.  Trouble sleeping.  Slowed development in children.  Short stature in children. DIAGNOSIS Chronic kidney disease may be detected and diagnosed by tests, including blood, urine, imaging, or kidney biopsy tests. TREATMENT Most chronic kidney diseases cannot be cured. Treatment usually involves relieving symptoms and preventing or slowing the progression of the disease. Treatment may include:  A special diet. You may need to avoid alcohol and foods thatare salty and high in potassium.  Medicines. These may:  Lower blood pressure.  Relieve anemia.  Relieve swelling.  Protect the bones. HOME CARE INSTRUCTIONS  Follow your prescribed diet. Your health care provider may instruct you to limit daily salt (sodium) and protein intake.  Take medicines only as directed by your  health care provider. Do not take any new medicines (prescription, over-the-counter, or nutritional supplements) unless approved by your health care provider. Many medicines can worsen your kidney damage or need to have the dose adjusted.   Quit smoking if you smoke. Talk to your health care provider about a smoking cessation program.  Keep all follow-up visits as directed by your health care provider.  Monitor your blood pressure.  Start or continue an exercise plan.  Get immunizations as directed by your health care provider.  Take vitamin and mineral supplements as directed by your health care provider. SEEK IMMEDIATE MEDICAL CARE IF:  Your symptoms get worse or you develop new symptoms.  You develop symptoms of end-stage kidney disease. These include:  Headaches.  Abnormally dark or light skin.  Numbness in the hands or feet.  Easy bruising.  Frequent hiccups.  Menstruation stops.  You have a fever.  You have decreased urine production.  You havepain or bleeding when urinating. MAKE SURE YOU:  Understand these instructions.  Will watch your condition.  Will get help right away if you are not doing well or get worse. FOR MORE INFORMATION   American Association of Kidney Patients: BombTimer.gl  National Kidney Foundation: www.kidney.org  American  Kidney Fund: https://mathis.com/  [employer] [facility]: www.lifeoptions.org and www.kidneyschool.org   This information is not intended to replace advice given to you by your health care provider. Make sure you discuss any questions you have with your health care provider.   Document Released: 01/26/2008 Document Revised: 05/09/2014 Document Reviewed: 12/16/2011 Elsevier Interactive Patient Education [employer].

## 2015-05-01 LAB — FERRITIN: Ferritin: 183 ng/mL (ref 22–322)

## 2015-05-01 LAB — ANTI-DNA ANTIBODY, DOUBLE-STRANDED: ds DNA Ab: <1 IU/mL

## 2015-05-01 LAB — RPR

## 2015-05-01 LAB — C3 AND C4
C3 Complement: 143 mg/dL (ref 90–180)
C4 Complement: 36 mg/dL (ref 10–40)

## 2015-05-01 LAB — ANA: Anti Nuclear Antibody(ANA): NEGATIVE

## 2015-05-28 ENCOUNTER — Other Ambulatory Visit: Payer: Self-pay | Admitting: Physician Assistant

## 2015-05-28 DIAGNOSIS — E349 Endocrine disorder, unspecified: Secondary | ICD-10-CM

## 2015-05-29 NOTE — Telephone Encounter (Signed)
Rx called into [facility] in [place].

## 2015-07-21 ENCOUNTER — Other Ambulatory Visit: Payer: Self-pay | Admitting: Internal Medicine

## 2015-07-29 ENCOUNTER — Other Ambulatory Visit: Payer: Self-pay | Admitting: Internal Medicine

## 2015-08-05 ENCOUNTER — Ambulatory Visit (INDEPENDENT_AMBULATORY_CARE_PROVIDER_SITE_OTHER): Payer: BLUE CROSS/BLUE SHIELD | Admitting: Physician Assistant

## 2015-08-05 VITALS — BP 136/84 | HR 81 | Temp 97.9°F | Resp 16 | Ht 73.5 in | Wt 299.8 lb

## 2015-08-05 DIAGNOSIS — I10 Essential (primary) hypertension: Secondary | ICD-10-CM

## 2015-08-05 DIAGNOSIS — N183 Chronic kidney disease, stage 3 (moderate): Secondary | ICD-10-CM | POA: Diagnosis not present

## 2015-08-05 DIAGNOSIS — E291 Testicular hypofunction: Secondary | ICD-10-CM | POA: Diagnosis not present

## 2015-08-05 DIAGNOSIS — E559 Vitamin D deficiency, unspecified: Secondary | ICD-10-CM | POA: Diagnosis not present

## 2015-08-05 DIAGNOSIS — N182 Chronic kidney disease, stage 2 (mild): Secondary | ICD-10-CM | POA: Diagnosis not present

## 2015-08-05 DIAGNOSIS — E669 Obesity, unspecified: Secondary | ICD-10-CM

## 2015-08-05 DIAGNOSIS — E1122 Type 2 diabetes mellitus with diabetic chronic kidney disease: Secondary | ICD-10-CM | POA: Diagnosis not present

## 2015-08-05 DIAGNOSIS — E782 Mixed hyperlipidemia: Secondary | ICD-10-CM

## 2015-08-05 DIAGNOSIS — Z79899 Other long term (current) drug therapy: Secondary | ICD-10-CM

## 2015-08-05 DIAGNOSIS — E349 Endocrine disorder, unspecified: Secondary | ICD-10-CM

## 2015-08-05 NOTE — Progress Notes (Signed)
Assessment and Plan:  Hypertension: Continue medication, monitor blood pressure at home. Continue DASH diet. Cholesterol: Continue diet and exercise. Check cholesterol.  Diabetes with CKD-Continue diet and exercise. Check A1C Vitamin D Def- check level and continue medications.  Obesity with co morbidities- long discussion about weight loss, diet, and exercise  Continue diet and meds as discussed. Further disposition pending results of labs. Discussed med's effects and SE's.    HPI 55 y.o. male  presents for 3 month follow up with hypertension, hyperlipidemia, diabetes and vitamin D. His blood pressure has been controlled at home, today their BP is BP: 136/84 mmHg He does workout, he is in the gym 3 days a week and outside a lot. He denies chest pain, shortness of breath, dizziness.  He is not on cholesterol medication and denies myalgias. His cholesterol is at goal. The cholesterol last visit was:   Lab Results  Component Value Date   CHOL 148 03/31/2015   HDL 44 03/31/2015   LDLCALC 62 03/31/2015   TRIG 211* 03/31/2015   CHOLHDL 3.4 03/31/2015   He has been working on diet and exercise for Diabetes with CKD, he is on an ACE, he is now on Jardiance due to kidney function, Victoza, and metformin, he is seeing endocrinology, he is on bASA, he does check his sugars, have been a bit better, wants to get back to working out, has finished MBA, Mother in law moved in with them from [place] for several months, building man cave in garage, and denies paresthesia of the feet, polydipsia and polyuria. Last A1C in the office was:  Lab Results  Component Value Date   HGBA1C 10.6* 03/31/2015   Lab Results  Component Value Date   GFRAA 60 04/30/2015   Patient is on Vitamin D supplement. Lab Results  Component Value Date   VD25OH 1 03/31/2015     He is has a history of testosterone deficiency and is on testosterone replacement, last shot was 1 week ago, does q 2 weeks. He states that the  testosterone helps with his energy, libido, muscle mass. Lab Results  Component Value Date   TESTOSTERONE 260* 12/12/2014   BMI is Body mass index is 39.01 kg/(m^2)., he is working on diet and exercise. Wt Readings from Last 3 Encounters:  08/05/15 299 lb 12.8 oz (135.988 kg)  04/30/15 293 lb (132.904 kg)  03/31/15 290 lb (131.543 kg)    Current Medications:  Current Outpatient Prescriptions on File Prior to Visit  Medication Sig Dispense Refill  . aspirin EC 81 MG tablet Take 81 mg by mouth daily.    . Cholecalciferol (VITAMIN D-3) 5000 UNITS TABS Take 5,000 Units by mouth daily.    . empagliflozin (JARDIANCE) 25 MG TABS tablet Take 25 mg by mouth daily. 30 tablet 4  . ibuprofen (ADVIL,MOTRIN) 200 MG tablet Take 400 mg by mouth every 6 (six) hours as needed for moderate pain.    . INVOKANA 300 MG TABS tablet TAKE 1 TABLET BY MOUTH DAILY. 30 tablet 3  . lisinopril (PRINIVIL,ZESTRIL) 20 MG tablet TAKE 1 TABLET (20 MG TOTAL) BY MOUTH DAILY. 90 tablet 0  . metFORMIN (GLUCOPHAGE-XR) 500 MG 24 hr tablet Take 4 tablets (2,000 mg total) by mouth daily. (Patient taking differently: Take 2,000 mg by mouth 2 (two) times daily. ) 120 tablet 3  . testosterone cypionate (DEPOTESTOSTERONE CYPIONATE) 200 MG/ML injection INJECT 1.5ML INTRAMUSCULAR EVERY 2 WEEKS 10 mL 3  . traMADol (ULTRAM) 50 MG tablet Take 1 tablet 4  x day if needed for pains (Patient taking differently: Take 50 mg by mouth 4 (four) times daily as needed for moderate pain. ) 50 tablet 0  . VIAGRA 100 MG tablet TAKE 1/2 TO 1 TABLET ONCE DAILY AS NEEDED  99  . VICTOZA 18 MG/3ML SOPN INJECT 1.8 MGS DAILY AS DIRECTED 9 pen 2   No current facility-administered medications on file prior to visit.   Medical History:  Past Medical History  Diagnosis Date  . Hypogonadism male   . Right bundle branch block   . OSA on CPAP   . Type 2 diabetes mellitus ([place])   . Left ureteral stone   . History of kidney stones    Allergies: No Known  Allergies   Review of Systems  Constitutional: Positive for malaise/fatigue. Negative for fever, chills, weight loss and diaphoresis.  HENT: Negative.   Eyes: Negative.   Respiratory: Negative.  Negative for shortness of breath.   Cardiovascular: Negative.  Negative for chest pain and claudication.  Gastrointestinal: Negative.  Negative for abdominal pain, diarrhea, constipation, blood in stool and melena.  Genitourinary: Negative.   Musculoskeletal: Negative.   Skin: Negative.   Neurological: Negative.  Negative for weakness.  Psychiatric/Behavioral: Negative.     Family history- Review and unchanged Social history- Review and unchanged Physical Exam: BP 136/84 mmHg  Pulse 81  Temp(Src) 97.9 F (36.6 C) (Temporal)  Resp 16  Ht 6' 1.5" (1.867 m)  Wt 299 lb 12.8 oz (135.988 kg)  BMI 39.01 kg/m2  SpO2 96% Wt Readings from Last 3 Encounters:  08/05/15 299 lb 12.8 oz (135.988 kg)  04/30/15 293 lb (132.904 kg)  03/31/15 290 lb (131.543 kg)   General Appearance: Well nourished, in no apparent distress. Eyes: PERRLA, EOMs, conjunctiva no swelling or erythema Sinuses: No Frontal/maxillary tenderness ENT/Mouth: Ext aud canals clear, TMs without erythema, bulging. No erythema, swelling, or exudate on post pharynx.  Tonsils not swollen or erythematous. Hearing normal.  Neck: Supple, thyroid normal.  Respiratory: Respiratory effort normal, BS equal bilaterally without rales, rhonchi, wheezing or stridor.  Cardio: RRR with no MRGs. Brisk peripheral pulses without edema.  Abdomen: Soft, + BS.  Non tender, no guarding, rebound, hernias, masses. Lymphatics: Non tender without lymphadenopathy.  Musculoskeletal: Full ROM, 5/5 strength, normal gait.  Skin: Warm, dry without rashes, lesions, ecchymosis.  Neuro: Cranial nerves intact. No cerebellar symptoms. Sensation intact.  Psych: Awake and oriented X 3, normal affect, Insight and Judgment appropriate.    Vicie Mutters 4:43 PM

## 2015-08-05 NOTE — Patient Instructions (Signed)
    Bad carbs also include fruit juice, alcohol, and sweet tea. These are empty calories that do not signal to your brain that you are full.   Please remember the good carbs are still carbs which convert into sugar. So please measure them out no more than 1/2-1 cup of rice, oatmeal, pasta, and beans  Veggies are however free foods! Pile them on.   Not all fruit is created equal. Please see the list below, the fruit at the bottom is higher in sugars than the fruit at the top. Please avoid all dried fruits.     Recommendations For Diabetic/Prediabetic Patients:   -  Take medications as prescribed  -  Recommend Dr Joel Fuhrman's book "The End of Diabetes "  And "The End of Dieting"- Can get at  www.Amazon.com and encourage also get the Audio CD book  - AVOID Animal products, ie. Meat - red/white, Poultry and Dairy/especially cheese - Exercise at least 5 times a week for 30 minutes or preferably daily.  - No Smoking - Drink less than 2 drinks a day.  - Monitor your feet for sores - Have yearly Eye Exams - Recommend annual Flu vaccine  - Recommend Pneumovax and Prevnar vaccines - Shingles Vaccine (Zostavax) if over 60 y.o.  Goals:   - BMI less than 24 - Fasting sugar less than 130 or less than 150 if tapering medicines to lose weight  - Systolic BP less than 130  - Diastolic BP less than 80 - Bad LDL Cholesterol less than 70 - Triglycerides less than 150  

## 2015-08-06 LAB — CBC WITH DIFFERENTIAL/PLATELET
BASOS ABS: 59 {cells}/uL (ref 0–200)
Basophils Relative: 1 %
EOS ABS: 177 {cells}/uL (ref 15–500)
Eosinophils Relative: 3 %
HCT: 58.2 % — ABNORMAL HIGH (ref 38.5–50.0)
HEMOGLOBIN: 19.9 g/dL — AB (ref 13.2–17.1)
LYMPHS ABS: 2537 {cells}/uL (ref 850–3900)
Lymphocytes Relative: 43 %
MCH: 29 pg (ref 27.0–33.0)
MCHC: 34.2 g/dL (ref 32.0–36.0)
MCV: 84.8 fL (ref 80.0–100.0)
MONO ABS: 354 {cells}/uL (ref 200–950)
MONOS PCT: 6 %
MPV: 11 fL (ref 7.5–12.5)
NEUTROS ABS: 2773 {cells}/uL (ref 1500–7800)
Neutrophils Relative %: 47 %
Platelets: 196 10*3/uL (ref 140–400)
RBC: 6.86 MIL/uL — ABNORMAL HIGH (ref 4.20–5.80)
RDW: 13.9 % (ref 11.0–15.0)
WBC: 5.9 10*3/uL (ref 3.8–10.8)

## 2015-08-06 LAB — LIPID PANEL
CHOLESTEROL: 149 mg/dL (ref 125–200)
HDL: 42 mg/dL (ref >=40)
LDL Cholesterol: 64 mg/dL (ref <130)
Total CHOL/HDL Ratio: 3.5 Ratio (ref <=5.0)
Triglycerides: 215 mg/dL — ABNORMAL HIGH (ref <150)
VLDL: 43 mg/dL — AB (ref <30)

## 2015-08-06 LAB — HEPATIC FUNCTION PANEL
ALK PHOS: 91 U/L (ref 40–115)
ALT: 31 U/L (ref 9–46)
AST: 16 U/L (ref 10–35)
Albumin: 4.3 g/dL (ref 3.6–5.1)
Bilirubin, Direct: 0.3 mg/dL — ABNORMAL HIGH (ref <=0.2)
Indirect Bilirubin: 1.3 mg/dL — ABNORMAL HIGH (ref 0.2–1.2)
Total Bilirubin: 1.6 mg/dL — ABNORMAL HIGH (ref 0.2–1.2)
Total Protein: 6.9 g/dL (ref 6.1–8.1)

## 2015-08-06 LAB — BASIC METABOLIC PANEL WITH GFR
BUN: 19 mg/dL (ref 7–25)
CHLORIDE: 102 mmol/L (ref 98–110)
CO2: 21 mmol/L (ref 20–31)
CREATININE: 1.59 mg/dL — AB (ref 0.70–1.33)
Calcium: 9.8 mg/dL (ref 8.6–10.3)
GFR, Est African American: 57 mL/min — ABNORMAL LOW (ref >=60)
GFR, Est Non African American: 49 mL/min — ABNORMAL LOW (ref >=60)
GLUCOSE: 131 mg/dL — AB (ref 65–99)
Potassium: 4.2 mmol/L (ref 3.5–5.3)
Sodium: 139 mmol/L (ref 135–146)

## 2015-08-06 LAB — HEMOGLOBIN A1C
Hgb A1c MFr Bld: 8.7 % — ABNORMAL HIGH (ref <5.7)
MEAN PLASMA GLUCOSE: 203 mg/dL

## 2015-08-06 LAB — MAGNESIUM: MAGNESIUM: 1.8 mg/dL (ref 1.5–2.5)

## 2015-08-06 LAB — TSH: TSH: 1.11 mIU/L (ref 0.40–4.50)

## 2015-08-06 LAB — VITAMIN D 25 HYDROXY (VIT D DEFICIENCY, FRACTURES): Vit D, 25-Hydroxy: 43 ng/mL (ref 30–100)

## 2015-08-07 ENCOUNTER — Other Ambulatory Visit: Payer: Self-pay | Admitting: *Deleted

## 2015-08-07 MED ORDER — EMPAGLIFLOZIN 25 MG PO TABS: 25.0000 mg | ORAL_TABLET | Freq: Every day | ORAL | Status: DC

## 2015-08-18 ENCOUNTER — Other Ambulatory Visit: Payer: Self-pay

## 2015-08-18 MED ORDER — EMPAGLIFLOZIN 25 MG PO TABS: 25.0000 mg | ORAL_TABLET | Freq: Every day | ORAL | Status: DC

## 2015-08-21 ENCOUNTER — Other Ambulatory Visit: Payer: Self-pay | Admitting: Internal Medicine

## 2015-09-18 ENCOUNTER — Other Ambulatory Visit: Payer: Self-pay | Admitting: Internal Medicine

## 2015-09-19 ENCOUNTER — Encounter: Payer: Self-pay | Admitting: *Deleted

## 2015-10-11 ENCOUNTER — Other Ambulatory Visit: Payer: Self-pay | Admitting: Internal Medicine

## 2015-10-19 ENCOUNTER — Other Ambulatory Visit: Payer: Self-pay | Admitting: Internal Medicine

## 2015-10-20 NOTE — Telephone Encounter (Signed)
Rx called into [facility] pharmacy. 

## 2015-11-10 ENCOUNTER — Ambulatory Visit: Payer: Self-pay | Admitting: Physician Assistant

## 2015-11-10 ENCOUNTER — Ambulatory Visit (INDEPENDENT_AMBULATORY_CARE_PROVIDER_SITE_OTHER): Payer: BLUE CROSS/BLUE SHIELD | Admitting: Internal Medicine

## 2015-11-10 ENCOUNTER — Encounter: Payer: Self-pay | Admitting: Internal Medicine

## 2015-11-10 VITALS — BP 130/80 | HR 81 | Temp 97.7°F | Resp 16 | Ht 73.5 in | Wt 290.4 lb

## 2015-11-10 DIAGNOSIS — N183 Chronic kidney disease, stage 3 unspecified: Secondary | ICD-10-CM

## 2015-11-10 DIAGNOSIS — Z79899 Other long term (current) drug therapy: Secondary | ICD-10-CM | POA: Diagnosis not present

## 2015-11-10 DIAGNOSIS — E559 Vitamin D deficiency, unspecified: Secondary | ICD-10-CM | POA: Diagnosis not present

## 2015-11-10 DIAGNOSIS — E1122 Type 2 diabetes mellitus with diabetic chronic kidney disease: Secondary | ICD-10-CM

## 2015-11-10 DIAGNOSIS — I10 Essential (primary) hypertension: Secondary | ICD-10-CM | POA: Diagnosis not present

## 2015-11-10 DIAGNOSIS — E291 Testicular hypofunction: Secondary | ICD-10-CM

## 2015-11-10 DIAGNOSIS — E782 Mixed hyperlipidemia: Secondary | ICD-10-CM | POA: Diagnosis not present

## 2015-11-10 NOTE — Progress Notes (Signed)
Assessment and Plan:  Hypertension:  -Continue medication -monitor blood pressure at home. -Continue DASH diet -Reminder to go to the ER if any CP, SOB, nausea, dizziness, severe HA, changes vision/speech, left arm numbness and tingling and jaw pain.  Cholesterol - Continue diet and exercise -Check cholesterol.   Diabetes without complications -Continue diet and exercise.  -Check A1C  Vitamin D Def -check level -continue medications.   Morbid obesity -cont diet and exercise  Hypogonadism -cont injections -too soon in shot cycle to do injection  Continue diet and meds as discussed. Further disposition pending results of labs. Discussed med's effects and SE's.    HPI 55 y.o. male  presents for 3 month follow up with hypertension, hyperlipidemia, diabetes and vitamin D deficiency.   His blood pressure has been controlled at home, today their BP is BP: 130/80 mmHg.He does workout. He denies chest pain, shortness of breath, dizziness.  He reports that he is done with school so he is exercising.  He is lifting and is getting in a lot of yard work.  He is actively trying to lose weight currently as he knows he gained a lot of weight in school.    He is on cholesterol medication and denies myalgias. His cholesterol is at goal. The cholesterol was:  08/05/2015: Cholesterol 149; HDL 42; LDL Cholesterol 64; Triglycerides 215*   He has been working on diet and exercise for diabetes without complications, he is on bASA, he is on ACE/ARB, and denies  foot ulcerations, hyperglycemia, hypoglycemia , increased appetite, nausea, paresthesia of the feet, polydipsia, polyuria, visual disturbances, vomiting and weight loss. Last A1C was: 08/05/2015: Hgb A1c MFr Bld 8.7*.  He reports that his blood sugars have been running okay.  They are around 117-140.  No readings less than 70.     Patient is on Vitamin D supplement. 08/05/2015: Vit D, 25-Hydroxy 43  He reports that his last testosterone injection was  Sunday. He reports that he is doing well with these.  He does feel like it is helping with his energy levels.    He has no other complaints today.     Current Medications:  Current Outpatient Prescriptions on File Prior to Visit  Medication Sig Dispense Refill  . aspirin EC 81 MG tablet Take 81 mg by mouth daily.    . Cholecalciferol (VITAMIN D-3) 5000 UNITS TABS Take 5,000 Units by mouth daily.    . empagliflozin (JARDIANCE) 25 MG TABS tablet Take 25 mg by mouth daily. 30 tablet 3  . lisinopril (PRINIVIL,ZESTRIL) 20 MG tablet TAKE 1 TABLET (20 MG TOTAL) BY MOUTH DAILY. 90 tablet 0  . metFORMIN (GLUCOPHAGE-XR) 500 MG 24 hr tablet TAKE 4 TABLETS (2,000 MG TOTAL) BY MOUTH DAILY. 120 tablet 1  . testosterone cypionate (DEPOTESTOSTERONE CYPIONATE) 200 MG/ML injection INJECT 1.5MLS INTRAMUSCULARLY EVERY 2 WEEKS 10 mL 3  . VIAGRA 100 MG tablet TAKE 1/2 TO 1 TABLET ONCE DAILY AS NEEDED 6 tablet 11  . VICTOZA 18 MG/3ML SOPN INJECT 1.8 MGS DAILY AS DIRECTED 9 pen 2   No current facility-administered medications on file prior to visit.   Medical History:  Past Medical History  Diagnosis Date  . Hypogonadism male   . Right bundle branch block   . OSA on CPAP   . Type 2 diabetes mellitus ([place])   . Left ureteral stone   . History of kidney stones    Allergies: No Known Allergies   Review of Systems:  Review of Systems  Constitutional: Negative for fever, chills and malaise/fatigue.  HENT: Negative for congestion, ear pain and sore throat.   Eyes: Negative.   Respiratory: Negative for cough, shortness of breath and wheezing.   Cardiovascular: Negative for chest pain, palpitations and leg swelling.  Gastrointestinal: Negative for heartburn, abdominal pain, diarrhea, constipation, blood in stool and melena.  Genitourinary: Negative.   Skin: Negative.   Neurological: Negative for dizziness, sensory change, loss of consciousness and headaches.  Psychiatric/Behavioral: Negative for depression.  The patient is not nervous/anxious and does not have insomnia.     Family history- Review and unchanged  Social history- Review and unchanged  Physical Exam: BP 130/80 mmHg  Pulse 81  Temp(Src) 97.7 F (36.5 C) (Temporal)  Resp 16  Ht 6' 1.5" (1.867 m)  Wt 290 lb 6.4 oz (131.725 kg)  BMI 37.79 kg/m2  SpO2 99% Wt Readings from Last 3 Encounters:  11/10/15 290 lb 6.4 oz (131.725 kg)  08/05/15 299 lb 12.8 oz (135.988 kg)  04/30/15 293 lb (132.904 kg)   General Appearance: Well nourished well developed, non-toxic appearing, in no apparent distress. Eyes: PERRLA, EOMs, conjunctiva no swelling or erythema ENT/Mouth: Ear canals clear with no erythema, swelling, or discharge.  TMs normal bilaterally, oropharynx clear, moist, with no exudate.   Neck: Supple, thyroid normal, no JVD, no cervical adenopathy.  Respiratory: Respiratory effort normal, breath sounds clear A&P, no wheeze, rhonchi or rales noted.  No retractions, no accessory muscle usage Cardio: RRR with no MRGs. No noted edema.  Abdomen: Soft, + BS.  Non tender, no guarding, rebound, hernias, masses. Musculoskeletal: Full ROM, 5/5 strength, Normal gait Skin: Warm, dry without rashes, lesions, ecchymosis.  Neuro: Awake and oriented X 3, Cranial nerves intact. No cerebellar symptoms.  Psych: normal affect, Insight and Judgment appropriate.    Starlyn Skeans, PA-C 5:19 PM [facility]

## 2015-11-11 LAB — CBC WITH DIFFERENTIAL/PLATELET
BASOS ABS: 53 {cells}/uL (ref 0–200)
BASOS PCT: 1 %
EOS PCT: 5 %
Eosinophils Absolute: 265 cells/uL (ref 15–500)
HCT: 60.5 % — ABNORMAL HIGH (ref 38.5–50.0)
HEMOGLOBIN: 20.7 g/dL — AB (ref 13.2–17.1)
LYMPHS ABS: 2650 {cells}/uL (ref 850–3900)
Lymphocytes Relative: 50 %
MCH: 29.6 pg (ref 27.0–33.0)
MCHC: 34.2 g/dL (ref 32.0–36.0)
MCV: 86.4 fL (ref 80.0–100.0)
MONOS PCT: 6 %
MPV: 11.1 fL (ref 7.5–12.5)
Monocytes Absolute: 318 cells/uL (ref 200–950)
NEUTROS ABS: 2014 {cells}/uL (ref 1500–7800)
Neutrophils Relative %: 38 %
PLATELETS: 189 10*3/uL (ref 140–400)
RBC: 7 MIL/uL — AB (ref 4.20–5.80)
RDW: 13.6 % (ref 11.0–15.0)
WBC: 5.3 10*3/uL (ref 3.8–10.8)

## 2015-11-11 LAB — BASIC METABOLIC PANEL WITH GFR
BUN: 25 mg/dL (ref 7–25)
CHLORIDE: 104 mmol/L (ref 98–110)
CO2: 20 mmol/L (ref 20–31)
CREATININE: 1.61 mg/dL — AB (ref 0.70–1.33)
Calcium: 10.2 mg/dL (ref 8.6–10.3)
GFR, EST NON AFRICAN AMERICAN: 48 mL/min — AB (ref >=60)
GFR, Est African American: 56 mL/min — ABNORMAL LOW (ref >=60)
Glucose, Bld: 131 mg/dL — ABNORMAL HIGH (ref 65–99)
POTASSIUM: 4.3 mmol/L (ref 3.5–5.3)
SODIUM: 137 mmol/L (ref 135–146)

## 2015-11-11 LAB — HEMOGLOBIN A1C
Hgb A1c MFr Bld: 9.1 % — ABNORMAL HIGH (ref <5.7)
MEAN PLASMA GLUCOSE: 214 mg/dL

## 2015-11-11 LAB — HEPATIC FUNCTION PANEL
ALT: 27 U/L (ref 9–46)
AST: 16 U/L (ref 10–35)
Albumin: 4.5 g/dL (ref 3.6–5.1)
Alkaline Phosphatase: 110 U/L (ref 40–115)
BILIRUBIN DIRECT: 0.2 mg/dL (ref <=0.2)
BILIRUBIN INDIRECT: 1.3 mg/dL — AB (ref 0.2–1.2)
BILIRUBIN TOTAL: 1.5 mg/dL — AB (ref 0.2–1.2)
Total Protein: 7.1 g/dL (ref 6.1–8.1)

## 2015-11-11 LAB — LIPID PANEL
CHOL/HDL RATIO: 2.8 ratio (ref <=5.0)
CHOLESTEROL: 149 mg/dL (ref 125–200)
HDL: 53 mg/dL (ref >=40)
LDL CALC: 58 mg/dL (ref <130)
Triglycerides: 190 mg/dL — ABNORMAL HIGH (ref <150)
VLDL: 38 mg/dL — AB (ref <30)

## 2015-11-11 LAB — TSH: TSH: 1.32 mIU/L (ref 0.40–4.50)

## 2015-11-12 ENCOUNTER — Telehealth: Payer: Self-pay | Admitting: *Deleted

## 2015-11-12 NOTE — Telephone Encounter (Signed)
Spoke with patient about recent lab results and instructions.  Advised patient he needs to RTC for additional labs to be drawn per Starlyn Skeans, PA-C orders and he also needs to d/c testosterone rx at this time until further notice.  Patient expressed understanding and scheduled lab appt.

## 2015-11-16 ENCOUNTER — Other Ambulatory Visit: Payer: BLUE CROSS/BLUE SHIELD | Admitting: Internal Medicine

## 2015-11-16 VITALS — BP 126/78 | HR 82 | Temp 98.2°F | Resp 18 | Ht 73.5 in

## 2015-11-16 DIAGNOSIS — T7840XA Allergy, unspecified, initial encounter: Secondary | ICD-10-CM

## 2015-11-16 DIAGNOSIS — E291 Testicular hypofunction: Secondary | ICD-10-CM | POA: Diagnosis not present

## 2015-11-16 DIAGNOSIS — D582 Other hemoglobinopathies: Secondary | ICD-10-CM

## 2015-11-16 DIAGNOSIS — I10 Essential (primary) hypertension: Secondary | ICD-10-CM | POA: Diagnosis not present

## 2015-11-16 LAB — RETICULOCYTES
ABS RETIC: 96300 {cells}/uL — AB (ref 25000–90000)
RBC.: 6.42 MIL/uL — AB (ref 4.20–5.80)
RETIC CT PCT: 1.5 %

## 2015-11-16 LAB — CBC WITH DIFFERENTIAL/PLATELET
BASOS ABS: 0 {cells}/uL (ref 0–200)
Basophils Relative: 0 %
EOS ABS: 204 {cells}/uL (ref 15–500)
Eosinophils Relative: 4 %
HEMATOCRIT: 55.4 % — AB (ref 38.5–50.0)
Hemoglobin: 18.9 g/dL — ABNORMAL HIGH (ref 13.2–17.1)
LYMPHS PCT: 38 %
Lymphs Abs: 1938 cells/uL (ref 850–3900)
MCH: 29.4 pg (ref 27.0–33.0)
MCHC: 34.1 g/dL (ref 32.0–36.0)
MCV: 86.3 fL (ref 80.0–100.0)
MONO ABS: 408 {cells}/uL (ref 200–950)
MONOS PCT: 8 %
MPV: 11 fL (ref 7.5–12.5)
NEUTROS PCT: 50 %
Neutro Abs: 2550 cells/uL (ref 1500–7800)
PLATELETS: 192 10*3/uL (ref 140–400)
RBC: 6.42 MIL/uL — ABNORMAL HIGH (ref 4.20–5.80)
RDW: 13.6 % (ref 11.0–15.0)
WBC: 5.1 10*3/uL (ref 3.8–10.8)

## 2015-11-16 MED ORDER — FAMOTIDINE 20 MG PO TABS: 20.0000 mg | ORAL_TABLET | Freq: Two times a day (BID) | ORAL | Status: DC

## 2015-11-16 MED ORDER — PREDNISONE 20 MG PO TABS: ORAL_TABLET | ORAL | Status: DC

## 2015-11-16 NOTE — Progress Notes (Signed)
   Subjective:    Patient ID: Carlos [name], male    DOB: 08-19-1960, 55 y.o.   MRN: [id number]  HPI  Patient presents to the office for evaluation of facial swelling after being stung by wasps.  He reports that he was stung on his nose several times yesterday while doing yard work.  He has never had a reaction in the past.  He has been taking benadryl 50 mg about 5 hours ago.  He reports that he has not been taking any 24 hour allergy meds.    Review of Systems  Constitutional: Negative for fever, chills and fatigue.  HENT: Positive for facial swelling. Negative for congestion, ear discharge, ear pain, rhinorrhea, sore throat and trouble swallowing.   Respiratory: Negative for chest tightness and shortness of breath.   Gastrointestinal: Negative for nausea and vomiting.       Objective:   Physical Exam  Constitutional: He is oriented to person, place, and time. He appears well-developed and well-nourished. No distress.  HENT:  Head: Normocephalic.  Mouth/Throat: Oropharynx is clear and moist. No oropharyngeal exudate.  Mild periorbital edema.  No evidence of perioral swelling.  No visible stings.    Eyes: Conjunctivae are normal. No scleral icterus.  Neck: Normal range of motion. Neck supple. No JVD present. No thyromegaly present.  Cardiovascular: Normal rate, regular rhythm, normal heart sounds and intact distal pulses.  Exam reveals no gallop and no friction rub.   No murmur heard. Pulmonary/Chest: Effort normal and breath sounds normal. No respiratory distress. He has no wheezes. He has no rales. He exhibits no tenderness.  Abdominal: Soft. Bowel sounds are normal. He exhibits no distension and no mass. There is no tenderness. There is no rebound and no guarding.  Musculoskeletal: Normal range of motion.  Lymphadenopathy:    He has no cervical adenopathy.  Neurological: He is alert and oriented to person, place, and time. No cranial nerve deficit. Coordination normal.  Skin:  Skin is warm and dry. No rash noted. He is not diaphoretic.  Psychiatric: He has a normal mood and affect. His behavior is normal. Judgment and thought content normal.  Nursing note and vitals reviewed.   Filed Vitals:   11/16/15 1635  BP: 126/78  Pulse: 82  Temp: 98.2 F (36.8 C)  Resp: 18          Assessment & Plan:    1. Elevated hemoglobin (HCC)  - CBC with Differential/Platelet - Iron and TIBC - Vitamin B12 - Ferritin - Reticulocytes Count - Hemochromatosis DNA-PCR(c282y,h63d) - Testosterone  2. Allergic reaction, initial encounter -cont benadryl -daily 24 hr antihistamine - predniSONE (DELTASONE) 20 MG tablet; 3 tabs po day one, then 2 tabs daily x 4 days  Dispense: 11 tablet; Refill: 0 - famotidine (PEPCID) 20 MG tablet; Take 1 tablet (20 mg total) by mouth 2 (two) times daily.  Dispense: 20 tablet; Refill: 0

## 2015-11-16 NOTE — Addendum Note (Signed)
Addended by: Starlyn Skeans A on: 11/16/2015 05:03 PM   Modules accepted: Orders

## 2015-11-17 LAB — FERRITIN: FERRITIN: 200 ng/mL (ref 20–380)

## 2015-11-17 LAB — IRON AND TIBC
%SAT: 38 % (ref 15–60)
Iron: 85 ug/dL (ref 50–180)
TIBC: 223 ug/dL — ABNORMAL LOW (ref 250–425)
UIBC: 138 ug/dL (ref 125–400)

## 2015-11-17 LAB — VITAMIN B12: Vitamin B-12: 650 pg/mL (ref 200–1100)

## 2015-11-17 LAB — TESTOSTERONE: TESTOSTERONE: 316 ng/dL (ref 250–827)

## 2015-11-20 ENCOUNTER — Other Ambulatory Visit: Payer: Self-pay | Admitting: Internal Medicine

## 2015-11-20 DIAGNOSIS — D582 Other hemoglobinopathies: Secondary | ICD-10-CM

## 2015-11-20 LAB — HEMOCHROMATOSIS DNA-PCR(C282Y,H63D)

## 2015-12-07 ENCOUNTER — Encounter: Payer: Self-pay | Admitting: Family

## 2015-12-07 ENCOUNTER — Ambulatory Visit: Payer: BLUE CROSS/BLUE SHIELD

## 2015-12-07 ENCOUNTER — Ambulatory Visit (HOSPITAL_BASED_OUTPATIENT_CLINIC_OR_DEPARTMENT_OTHER): Payer: BLUE CROSS/BLUE SHIELD | Admitting: Family

## 2015-12-07 ENCOUNTER — Other Ambulatory Visit: Payer: Self-pay | Admitting: Family

## 2015-12-07 ENCOUNTER — Other Ambulatory Visit (HOSPITAL_BASED_OUTPATIENT_CLINIC_OR_DEPARTMENT_OTHER): Payer: BLUE CROSS/BLUE SHIELD

## 2015-12-07 VITALS — BP 113/75 | HR 88 | Temp 98.4°F | Resp 18 | Ht 73.5 in | Wt 277.0 lb

## 2015-12-07 DIAGNOSIS — D582 Other hemoglobinopathies: Secondary | ICD-10-CM

## 2015-12-07 DIAGNOSIS — E291 Testicular hypofunction: Secondary | ICD-10-CM

## 2015-12-07 DIAGNOSIS — E119 Type 2 diabetes mellitus without complications: Secondary | ICD-10-CM | POA: Diagnosis not present

## 2015-12-07 DIAGNOSIS — E349 Endocrine disorder, unspecified: Secondary | ICD-10-CM

## 2015-12-07 LAB — CBC WITH DIFFERENTIAL (CANCER CENTER ONLY)
BASO#: 0.1 10*3/uL (ref 0.0–0.2)
BASO%: 1 % (ref 0.0–2.0)
EOS ABS: 0.2 10*3/uL (ref 0.0–0.5)
EOS%: 3.3 % (ref 0.0–7.0)
HCT: 58.5 % — ABNORMAL HIGH (ref 38.7–49.9)
HGB: 20.9 g/dL — ABNORMAL HIGH (ref 13.0–17.1)
LYMPH#: 2.6 10*3/uL (ref 0.9–3.3)
LYMPH%: 45.6 % (ref 14.0–48.0)
MCH: 29.6 pg (ref 28.0–33.4)
MCHC: 35.7 g/dL (ref 32.0–35.9)
MCV: 83 fL (ref 82–98)
MONO#: 0.3 10*3/uL (ref 0.1–0.9)
MONO%: 4.9 % (ref 0.0–13.0)
NEUT#: 2.6 10*3/uL (ref 1.5–6.5)
NEUT%: 45.2 % (ref 40.0–80.0)
PLATELETS: 175 10*3/uL (ref 145–400)
RBC: 7.06 10*6/uL — ABNORMAL HIGH (ref 4.20–5.70)
RDW: 13.2 % (ref 11.1–15.7)
WBC: 5.7 10*3/uL (ref 4.0–10.0)

## 2015-12-07 LAB — CHCC SATELLITE - SMEAR

## 2015-12-07 LAB — CMP (CANCER CENTER ONLY)
ALBUMIN: 4 g/dL (ref 3.3–5.5)
ALT(SGPT): 64 U/L — ABNORMAL HIGH (ref 10–47)
AST: 32 U/L (ref 11–38)
Alkaline Phosphatase: 254 U/L — ABNORMAL HIGH (ref 26–84)
BUN: 28 mg/dL — AB (ref 7–22)
CHLORIDE: 101 meq/L (ref 98–108)
CO2: 24 meq/L (ref 18–33)
CREATININE: 1.3 mg/dL — AB (ref 0.6–1.2)
Calcium: 9.8 mg/dL (ref 8.0–10.3)
Glucose, Bld: 262 mg/dL — ABNORMAL HIGH (ref 73–118)
POTASSIUM: 4.5 meq/L (ref 3.3–4.7)
SODIUM: 132 meq/L (ref 128–145)
Total Bilirubin: 1.5 mg/dl (ref 0.20–1.60)
Total Protein: 7.1 g/dL (ref 6.4–8.1)

## 2015-12-07 NOTE — Progress Notes (Unsigned)
Carlos [name] presents today for phlebotomy per MD orders. Phlebotomy procedure started at 1617and ended at 1627. Approximately 500 mls removed. Patient observed for 30 minutes after procedure without any incident. Patient tolerated procedure well. IV needle removed intact.

## 2015-12-07 NOTE — Patient Instructions (Signed)
Therapeutic Phlebotomy, Care After  Refer to this sheet in the next few weeks. These instructions provide you with information about caring for yourself after your procedure. Your health care provider may also give you more specific instructions. Your treatment has been planned according to current medical practices, but problems sometimes occur. Call your health care provider if you have any problems or questions after your procedure.  WHAT TO EXPECT AFTER THE PROCEDURE  After your procedure, it is common to have:   Light-headedness or dizziness. You may feel faint.   Nausea.   Tiredness.  HOME CARE INSTRUCTIONS  Activities   Return to your normal activities as directed by your health care provider. Most people can go back to their normal activities right away.   Avoid strenuous physical activity and heavy lifting or pulling for about 5 hours after the procedure. Do not lift anything that is heavier than 10 lb (4.5 kg).   Athletes should avoid strenuous exercise for at least 12 hours.   Change positions slowly for the remainder of the day. This will help to prevent light-headedness or fainting.   If you feel light-headed, lie down until the feeling goes away.  Eating and Drinking   Be sure to eat well-balanced meals for the next 24 hours.   Drink enough fluid to keep your urine clear or pale yellow.   Avoid drinking alcohol on the day that you had the procedure.  Care of the Needle Insertion Site   Keep your bandage dry. You can remove the bandage after about 5 hours or as directed by your health care provider.   If you have bleeding from the needle insertion site, elevate your arm and press firmly on the site until the bleeding stops.   If you have bruising at the site, apply ice to the area:   Put ice in a plastic bag.   Place a towel between your skin and the bag.   Leave the ice on for 20 minutes, 2-3 times a day for the first 24 hours.   If the swelling does not go away after 24 hours, apply  a warm, moist washcloth to the area for 20 minutes, 2-3 times a day.  General Instructions   Avoid smoking for at least 30 minutes after the procedure.   Keep all follow-up visits as directed by your health care provider. It is important to continue with further therapeutic phlebotomy treatments as directed.  SEEK MEDICAL CARE IF:   You have redness, swelling, or pain at the needle insertion site.   You have fluid, blood, or pus coming from the needle insertion site.   You feel light-headed, dizzy, or nauseated, and the feeling does not go away.   You notice new bruising at the needle insertion site.   You feel weaker than normal.   You have a fever or chills.  SEEK IMMEDIATE MEDICAL CARE IF:   You have severe nausea or vomiting.   You have chest pain.   You have trouble breathing.    This information is not intended to replace advice given to you by your health care provider. Make sure you discuss any questions you have with your health care provider.    Document Released: 09/20/2010 Document Revised: 09/02/2014 Document Reviewed: 04/14/2014  [employer] Interactive Patient Education 2016 [employer]

## 2015-12-07 NOTE — Progress Notes (Addendum)
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 Choung is a very pleasant 55 yo African American male with new diagnosis of hemochromatosis and elevated Hgb/Hct. His numbers today are 20.9/58.5.  He is taking an 81 mg Aspirin daily. BP is stable at this time.  His alk/phos is quite elevated at 254, AST is up at 64. He denies any abdominal pain or "fullness." No organomegaly found on assessment. He denies any personal or familial history of cirrhosis or liver cancer.  No personal history of blood clots. His father was recently diagnosed with a PE and his sister has history of DVT and PE.  Mother and paternal grandfather history of heart attack.  He has testosterone deficiency and received injections every 2 weeks.  He is diabetic and takes Metformin and Victoza. He states the his BS averages 180-185. He is 262 at this time.  No swelling, tenderness, numbness or tingling in his extremities. No new aches or pains.  He has maintained a good appetite and is staying well hydrated. Weight is stable. No significant loss or gain.  He does not smoke or drink alcohol.  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   ROS: All other 10 point review of systems is negative.   PAST MEDICAL HISTORY:   Past Medical History:  Diagnosis Date  . History of kidney stones   . Hypogonadism male   . Left ureteral stone   . OSA on CPAP   . Right bundle branch block   . Type 2 diabetes mellitus (HCC)     ALLERGIES: No Known Allergies    MEDICATIONS:  Current Outpatient Prescriptions on File Prior to Visit  Medication Sig Dispense Refill  . aspirin EC 81 MG tablet Take 81 mg by mouth daily.    .  Cholecalciferol (VITAMIN D-3) 5000 UNITS TABS Take 5,000 Units by mouth daily.    . empagliflozin (JARDIANCE) 25 MG TABS tablet Take 25 mg by mouth daily. 30 tablet 3  . metFORMIN (GLUCOPHAGE-XR) 500 MG 24 hr tablet TAKE 4 TABLETS (2,000 MG TOTAL) BY MOUTH DAILY. 120 tablet 1  . testosterone cypionate (DEPOTESTOSTERONE CYPIONATE) 200 MG/ML injection INJECT 1.5MLS INTRAMUSCULARLY EVERY 2 WEEKS 10 mL 3  . VIAGRA 100 MG tablet TAKE 1/2 TO 1 TABLET ONCE DAILY AS NEEDED 6 tablet 11  . VICTOZA 18 MG/3ML SOPN INJECT 1.8 MGS DAILY AS DIRECTED 9 pen 2   No current facility-administered medications on file prior to visit.      PAST SURGICAL HISTORY Past Surgical History:  Procedure Laterality Date  . ANKLE SURGERY Left 2002  . CYSTOSCOPY W/ RETROGRADES Left 12/05/2014   Procedure: CYSTOSCOPY WITH RETROGRADE PYELOGRAM;  Surgeon: Ardis Hughs, MD;  Location: [facility];  Service: Urology;  Laterality: Left;  . CYSTOSCOPY/RETROGRADE/URETEROSCOPY/STONE EXTRACTION WITH BASKET Left 12/05/2014   Procedure: LEFT  URETEROSCOPY, STONE EXTRACTION ;  Surgeon: Ardis Hughs, MD;  Location: [facility];  Service: Urology;  Laterality: Left;  . EXTRACORPOREAL SHOCK WAVE LITHOTRIPSY Left 09-25-2014  . RHINOPLASTY  2001    FAMILY HISTORY: Family History  Problem Relation Age of Onset  . Hypertension Mother   .  Cancer Mother     breast cancer  . Diabetes Father     SOCIAL HISTORY:  reports that he has never smoked. He has never used smokeless tobacco. He reports that he drinks alcohol. He reports that he does not use drugs.  PERFORMANCE STATUS: The patient's performance status is 1 - Symptomatic but completely ambulatory  PHYSICAL EXAM: Most Recent Vital Signs: Blood pressure 113/75, pulse 88, temperature 98.4 F (36.9 C), resp. rate 18, height 6' 1.5" (1.867 m), weight 277 lb (125.6 kg). BP 113/75 (BP Location: Left Arm, Patient Position: Sitting)   Pulse 88    Temp 98.4 F (36.9 C)   Resp 18   Ht 6' 1.5" (1.867 m)   Wt 277 lb (125.6 kg)   BMI 36.05 kg/m   General Appearance:    Alert, cooperative, no distress, appears stated age  Head:    Normocephalic, without obvious abnormality, atraumatic  Eyes:    PERRL, conjunctiva/corneas clear, EOM's intact, fundi    benign, both eyes             Throat:   Lips, mucosa, and tongue normal; teeth and gums normal  Neck:   Supple, symmetrical, trachea midline, no adenopathy;       thyroid:  No enlargement/tenderness/nodules; no carotid   bruit or JVD  Back:     Symmetric, no curvature, ROM normal, no CVA tenderness  Lungs:     Clear to auscultation bilaterally, respirations unlabored  Chest wall:    No tenderness or deformity  Heart:    Regular rate and rhythm, S1 and S2 normal, no murmur, rub   or gallop  Abdomen:     Soft, non-tender, bowel sounds active all four quadrants,    no masses, no organomegaly        Extremities:   Extremities normal, atraumatic, no cyanosis or edema  Pulses:   2+ and symmetric all extremities  Skin:   Skin color, texture, turgor normal, no rashes or lesions  Lymph nodes:   Cervical, supraclavicular, and axillary nodes normal  Neurologic:   CNII-XII intact. Normal strength, sensation and reflexes      throughout     LABORATORY DATA: Results for orders placed or performed in visit on 12/07/15 (from the past 48 hour(s))  CBC w/Diff     Status: Abnormal   Collection Time: 12/07/15  3:19 PM  Result Value Ref Range   WBC 5.7 4.0 - 10.0 10e3/uL   RBC 7.06 (H) 4.20 - 5.70 10e6/uL   HGB 20.9 (H) 13.0 - 17.1 g/dL   HCT 58.5 (H) 38.7 - 49.9 %   MCV 83 82 - 98 fL   MCH 29.6 28.0 - 33.4 pg   MCHC 35.7 32.0 - 35.9 g/dL   RDW 13.2 11.1 - 15.7 %   Platelets 175 145 - 400 10e3/uL   NEUT# 2.6 1.5 - 6.5 10e3/uL   LYMPH# 2.6 0.9 - 3.3 10e3/uL   MONO# 0.3 0.1 - 0.9 10e3/uL   Eosinophils Absolute 0.2 0.0 - 0.5 10e3/uL   BASO# 0.1 0.0 - 0.2 10e3/uL   NEUT% 45.2 40.0 - 80.0 %    LYMPH% 45.6 14.0 - 48.0 %   MONO% 4.9 0.0 - 13.0 %   EOS% 3.3 0.0 - 7.0 %   BASO% 1.0 0.0 - 2.0 %  CMP STAT     Status: Abnormal   Collection Time: 12/07/15  3:19 PM  Result Value Ref Range   Sodium 132 128 - 145 mEq/L  Potassium 4.5 3.3 - 4.7 mEq/L   Chloride 101 98 - 108 mEq/L   CO2 24 18 - 33 mEq/L   Glucose, Bld 262 (H) 73 - 118 mg/dL   BUN, Bld 28 (H) 7 - 22 mg/dL   Creat 1.3 (H) 0.6 - 1.2 mg/dl   Total Bilirubin 1.50 0.20 - 1.60 mg/dl   Alkaline Phosphatase 254 (H) 26 - 84 U/L   AST 32 11 - 38 U/L   ALT(SGPT) 64 (H) 10 - 47 U/L   Total Protein 7.1 6.4 - 8.1 g/dL   Albumin 4.0 3.3 - 5.5 g/dL   Calcium 9.8 8.0 - 10.3 mg/dL  Smear     Status: None   Collection Time: 12/07/15  3:19 PM  Result Value Ref Range   Smear Result Smear Available       RADIOGRAPHY: No results found.     PATHOLOGY: None   ASSESSMENT/PLAN: Mr. Jansson is a very pleasant 55 yo African American male with new diagnosis of hemochromatosis and elevated Hgb/Hct. His numbers today are 20.9/58.5. We will phlebotomize him today and then get him on a weekly phlebotomy program for the next 5 weeks.  His BP is stable at this time. His LFT's are elevated. We will get and abdominal US this week to assess his liver.  He will continue on his 81 mg Aspirin daily.  We will see what all his lab work shows. I have also added a testosterone level.  We will go ahead and plan to see him back in 6 weeks for repeat lab work and follow-up.  All questions were answered. He will contact our office with any problems, questions or concerns. We can certainly see the patient much sooner if necessary.  He was discussed with and also seen by Dr. Marin Olp and he is in agreement with the aforementioned.   [facility]    Addendum:  I saw and examined patient with Sarah. I looked at his blood smear. I did not see anything that looked suspicious on the blood smear. I did not see any argot cells. He had no nucleated red blood  cells. There were no teardrop cells. White cells were normal in morphology and maturation. He had no hypersegmented polys. There were no immature lymphoid cells. He has normal platelets.  I do believe that this erythrocytosis is probably from his testosterone. We see this quite often.  He also has hemochromatosis. This actually should not be much of an issue. He is heterozygous for the minor mutation, H63D.  His iron studies back in mid July showed a ferritin of 200 with an iron saturation of 37%.  I the weekend clearly phlebotomize him. I think he does have significant erythrocytosis. I would be surprised if he has a myeloproliferative disorder. However, we are checking his JAK2 mutation.  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 It is truly a pleasure to be able to help him.  He is on a baby aspirin. I think he is to continue this.  I do think that we have to begin phlebotomizing him now. I think we need to keep his hematocrit below 45%.  We will plan to get back to see [place] in about 6 weeks.  We spent about 40-45 minutes with him.  Laurey Arrow E     Addendum: I spoke with Mr. Langston today while he was here for a phlebotomy and let him know that his testosterone was still low and his abdominal US was  negative.    Eliezer Bottom, NP 12/14/15 14:40

## 2015-12-08 ENCOUNTER — Other Ambulatory Visit: Payer: Self-pay | Admitting: Lab

## 2015-12-08 ENCOUNTER — Other Ambulatory Visit: Payer: Self-pay | Admitting: *Deleted

## 2015-12-08 DIAGNOSIS — E349 Endocrine disorder, unspecified: Secondary | ICD-10-CM

## 2015-12-08 LAB — HEMOGLOBINOPATHY EVALUATION
HGB A: 98.1 % — AB (ref 94.0–98.0)
HGB C: 0 %
HGB S: 0 %
Hemoglobin A2 Quantitation: 1.9 % (ref 0.7–3.1)
Hemoglobin F Quantitation: 0 % (ref 0.0–2.0)

## 2015-12-08 LAB — LACTATE DEHYDROGENASE: LDH: 208 U/L (ref 125–245)

## 2015-12-08 LAB — IRON AND TIBC
%SAT: 85 % — AB (ref 20–55)
Iron: 201 ug/dL — ABNORMAL HIGH (ref 42–163)
TIBC: 235 ug/dL (ref 202–409)
UIBC: 34 ug/dL — AB (ref 117–376)

## 2015-12-08 LAB — FERRITIN: Ferritin: 533 ng/ml — ABNORMAL HIGH (ref 22–316)

## 2015-12-08 LAB — ERYTHROPOIETIN: Erythropoietin: 5 m[IU]/mL (ref 2.6–18.5)

## 2015-12-08 LAB — VITAMIN B12: Vitamin B12: 1025 pg/mL — ABNORMAL HIGH (ref 211–946)

## 2015-12-08 MED ORDER — LIRAGLUTIDE 18 MG/3ML ~~LOC~~ SOPN: PEN_INJECTOR | SUBCUTANEOUS | 2 refills | Status: DC

## 2015-12-09 LAB — TESTOSTERONE: TESTOSTERONE: 135 ng/dL — AB (ref 264–916)

## 2015-12-10 ENCOUNTER — Other Ambulatory Visit: Payer: Self-pay | Admitting: Family

## 2015-12-10 DIAGNOSIS — R748 Abnormal levels of other serum enzymes: Secondary | ICD-10-CM

## 2015-12-11 ENCOUNTER — Ambulatory Visit (HOSPITAL_BASED_OUTPATIENT_CLINIC_OR_DEPARTMENT_OTHER): Payer: BLUE CROSS/BLUE SHIELD

## 2015-12-11 ENCOUNTER — Telehealth: Payer: Self-pay | Admitting: Family

## 2015-12-11 ENCOUNTER — Ambulatory Visit (HOSPITAL_BASED_OUTPATIENT_CLINIC_OR_DEPARTMENT_OTHER)
Admission: RE | Admit: 2015-12-11 | Discharge: 2015-12-11 | Disposition: A | Discharge status: Home / Self Care | Payer: BLUE CROSS/BLUE SHIELD | Source: Ambulatory Visit | Attending: Family | Admitting: Family

## 2015-12-11 DIAGNOSIS — R748 Abnormal levels of other serum enzymes: Secondary | ICD-10-CM | POA: Diagnosis not present

## 2015-12-11 NOTE — Telephone Encounter (Signed)
Voicemail box with cell phone full. Unable to leave message. No answer on home phone and no voice mail available. Will try again later.

## 2015-12-14 ENCOUNTER — Ambulatory Visit (HOSPITAL_BASED_OUTPATIENT_CLINIC_OR_DEPARTMENT_OTHER): Payer: BLUE CROSS/BLUE SHIELD

## 2015-12-14 NOTE — Progress Notes (Signed)
Carlos [name] presents today for phlebotomy per MD orders. Phlebotomy procedure started at 1435 and ended at 1443. 570 grams removed. Patient observed for 30 minutes after procedure without any incident. Patient tolerated procedure well. IV needle removed intact.

## 2015-12-14 NOTE — Patient Instructions (Signed)
Therapeutic Phlebotomy, Care After  Refer to this sheet in the next few weeks. These instructions provide you with information about caring for yourself after your procedure. Your health care provider may also give you more specific instructions. Your treatment has been planned according to current medical practices, but problems sometimes occur. Call your health care provider if you have any problems or questions after your procedure.  WHAT TO EXPECT AFTER THE PROCEDURE  After your procedure, it is common to have:   Light-headedness or dizziness. You may feel faint.   Nausea.   Tiredness.  HOME CARE INSTRUCTIONS  Activities   Return to your normal activities as directed by your health care provider. Most people can go back to their normal activities right away.   Avoid strenuous physical activity and heavy lifting or pulling for about 5 hours after the procedure. Do not lift anything that is heavier than 10 lb (4.5 kg).   Athletes should avoid strenuous exercise for at least 12 hours.   Change positions slowly for the remainder of the day. This will help to prevent light-headedness or fainting.   If you feel light-headed, lie down until the feeling goes away.  Eating and Drinking   Be sure to eat well-balanced meals for the next 24 hours.   Drink enough fluid to keep your urine clear or pale yellow.   Avoid drinking alcohol on the day that you had the procedure.  Care of the Needle Insertion Site   Keep your bandage dry. You can remove the bandage after about 5 hours or as directed by your health care provider.   If you have bleeding from the needle insertion site, elevate your arm and press firmly on the site until the bleeding stops.   If you have bruising at the site, apply ice to the area:   Put ice in a plastic bag.   Place a towel between your skin and the bag.   Leave the ice on for 20 minutes, 2-3 times a day for the first 24 hours.   If the swelling does not go away after 24 hours, apply  a warm, moist washcloth to the area for 20 minutes, 2-3 times a day.  General Instructions   Avoid smoking for at least 30 minutes after the procedure.   Keep all follow-up visits as directed by your health care provider. It is important to continue with further therapeutic phlebotomy treatments as directed.  SEEK MEDICAL CARE IF:   You have redness, swelling, or pain at the needle insertion site.   You have fluid, blood, or pus coming from the needle insertion site.   You feel light-headed, dizzy, or nauseated, and the feeling does not go away.   You notice new bruising at the needle insertion site.   You feel weaker than normal.   You have a fever or chills.  SEEK IMMEDIATE MEDICAL CARE IF:   You have severe nausea or vomiting.   You have chest pain.   You have trouble breathing.    This information is not intended to replace advice given to you by your health care provider. Make sure you discuss any questions you have with your health care provider.    Document Released: 09/20/2010 Document Revised: 09/02/2014 Document Reviewed: 04/14/2014  [employer] Interactive Patient Education 2016 [employer]

## 2015-12-15 ENCOUNTER — Other Ambulatory Visit: Payer: Self-pay | Admitting: Physician Assistant

## 2015-12-15 ENCOUNTER — Encounter: Payer: Self-pay | Admitting: Hematology & Oncology

## 2015-12-19 ENCOUNTER — Other Ambulatory Visit: Payer: Self-pay | Admitting: Internal Medicine

## 2015-12-19 DIAGNOSIS — E1122 Type 2 diabetes mellitus with diabetic chronic kidney disease: Secondary | ICD-10-CM

## 2015-12-19 DIAGNOSIS — N183 Chronic kidney disease, stage 3 (moderate): Principal | ICD-10-CM

## 2015-12-19 MED ORDER — EMPAGLIFLOZIN 25 MG PO TABS: 25.0000 mg | ORAL_TABLET | Freq: Every day | ORAL | 1 refills | Status: DC

## 2015-12-21 ENCOUNTER — Other Ambulatory Visit: Payer: Self-pay | Admitting: *Deleted

## 2015-12-21 ENCOUNTER — Ambulatory Visit (HOSPITAL_BASED_OUTPATIENT_CLINIC_OR_DEPARTMENT_OTHER): Payer: BLUE CROSS/BLUE SHIELD

## 2015-12-21 MED ORDER — LIRAGLUTIDE 18 MG/3ML ~~LOC~~ SOPN: PEN_INJECTOR | SUBCUTANEOUS | 2 refills | Status: DC

## 2015-12-21 NOTE — Patient Instructions (Signed)
Therapeutic Phlebotomy, Care After  Refer to this sheet in the next few weeks. These instructions provide you with information about caring for yourself after your procedure. Your health care provider may also give you more specific instructions. Your treatment has been planned according to current medical practices, but problems sometimes occur. Call your health care provider if you have any problems or questions after your procedure.  WHAT TO EXPECT AFTER THE PROCEDURE  After your procedure, it is common to have:   Light-headedness or dizziness. You may feel faint.   Nausea.   Tiredness.  HOME CARE INSTRUCTIONS  Activities   Return to your normal activities as directed by your health care provider. Most people can go back to their normal activities right away.   Avoid strenuous physical activity and heavy lifting or pulling for about 5 hours after the procedure. Do not lift anything that is heavier than 10 lb (4.5 kg).   Athletes should avoid strenuous exercise for at least 12 hours.   Change positions slowly for the remainder of the day. This will help to prevent light-headedness or fainting.   If you feel light-headed, lie down until the feeling goes away.  Eating and Drinking   Be sure to eat well-balanced meals for the next 24 hours.   Drink enough fluid to keep your urine clear or pale yellow.   Avoid drinking alcohol on the day that you had the procedure.  Care of the Needle Insertion Site   Keep your bandage dry. You can remove the bandage after about 5 hours or as directed by your health care provider.   If you have bleeding from the needle insertion site, elevate your arm and press firmly on the site until the bleeding stops.   If you have bruising at the site, apply ice to the area:   Put ice in a plastic bag.   Place a towel between your skin and the bag.   Leave the ice on for 20 minutes, 2-3 times a day for the first 24 hours.   If the swelling does not go away after 24 hours, apply  a warm, moist washcloth to the area for 20 minutes, 2-3 times a day.  General Instructions   Avoid smoking for at least 30 minutes after the procedure.   Keep all follow-up visits as directed by your health care provider. It is important to continue with further therapeutic phlebotomy treatments as directed.  SEEK MEDICAL CARE IF:   You have redness, swelling, or pain at the needle insertion site.   You have fluid, blood, or pus coming from the needle insertion site.   You feel light-headed, dizzy, or nauseated, and the feeling does not go away.   You notice new bruising at the needle insertion site.   You feel weaker than normal.   You have a fever or chills.  SEEK IMMEDIATE MEDICAL CARE IF:   You have severe nausea or vomiting.   You have chest pain.   You have trouble breathing.    This information is not intended to replace advice given to you by your health care provider. Make sure you discuss any questions you have with your health care provider.    Document Released: 09/20/2010 Document Revised: 09/02/2014 Document Reviewed: 04/14/2014  [employer] Interactive Patient Education 2016 [employer]

## 2015-12-28 ENCOUNTER — Ambulatory Visit (HOSPITAL_BASED_OUTPATIENT_CLINIC_OR_DEPARTMENT_OTHER): Payer: BLUE CROSS/BLUE SHIELD

## 2015-12-28 NOTE — Progress Notes (Signed)
Carlos [name] presents today for phlebotomy per MD orders. Phlebotomy procedure started at 1525 and ended at 1538. 500 grams removed. Patient observed for 30 minutes after procedure without any incident. Patient tolerated procedure well. IV needle removed intact.

## 2015-12-28 NOTE — Patient Instructions (Signed)
Therapeutic Phlebotomy, Care After  Refer to this sheet in the next few weeks. These instructions provide you with information about caring for yourself after your procedure. Your health care provider may also give you more specific instructions. Your treatment has been planned according to current medical practices, but problems sometimes occur. Call your health care provider if you have any problems or questions after your procedure.  WHAT TO EXPECT AFTER THE PROCEDURE  After your procedure, it is common to have:   Light-headedness or dizziness. You may feel faint.   Nausea.   Tiredness.  HOME CARE INSTRUCTIONS  Activities   Return to your normal activities as directed by your health care provider. Most people can go back to their normal activities right away.   Avoid strenuous physical activity and heavy lifting or pulling for about 5 hours after the procedure. Do not lift anything that is heavier than 10 lb (4.5 kg).   Athletes should avoid strenuous exercise for at least 12 hours.   Change positions slowly for the remainder of the day. This will help to prevent light-headedness or fainting.   If you feel light-headed, lie down until the feeling goes away.  Eating and Drinking   Be sure to eat well-balanced meals for the next 24 hours.   Drink enough fluid to keep your urine clear or pale yellow.   Avoid drinking alcohol on the day that you had the procedure.  Care of the Needle Insertion Site   Keep your bandage dry. You can remove the bandage after about 5 hours or as directed by your health care provider.   If you have bleeding from the needle insertion site, elevate your arm and press firmly on the site until the bleeding stops.   If you have bruising at the site, apply ice to the area:   Put ice in a plastic bag.   Place a towel between your skin and the bag.   Leave the ice on for 20 minutes, 2-3 times a day for the first 24 hours.   If the swelling does not go away after 24 hours, apply  a warm, moist washcloth to the area for 20 minutes, 2-3 times a day.  General Instructions   Avoid smoking for at least 30 minutes after the procedure.   Keep all follow-up visits as directed by your health care provider. It is important to continue with further therapeutic phlebotomy treatments as directed.  SEEK MEDICAL CARE IF:   You have redness, swelling, or pain at the needle insertion site.   You have fluid, blood, or pus coming from the needle insertion site.   You feel light-headed, dizzy, or nauseated, and the feeling does not go away.   You notice new bruising at the needle insertion site.   You feel weaker than normal.   You have a fever or chills.  SEEK IMMEDIATE MEDICAL CARE IF:   You have severe nausea or vomiting.   You have chest pain.   You have trouble breathing.    This information is not intended to replace advice given to you by your health care provider. Make sure you discuss any questions you have with your health care provider.    Document Released: 09/20/2010 Document Revised: 09/02/2014 Document Reviewed: 04/14/2014  [employer] Interactive Patient Education 2016 [employer]

## 2016-01-05 ENCOUNTER — Ambulatory Visit (HOSPITAL_BASED_OUTPATIENT_CLINIC_OR_DEPARTMENT_OTHER): Payer: BLUE CROSS/BLUE SHIELD

## 2016-01-05 NOTE — Patient Instructions (Signed)
Therapeutic Phlebotomy, Care After  Refer to this sheet in the next few weeks. These instructions provide you with information about caring for yourself after your procedure. Your health care provider may also give you more specific instructions. Your treatment has been planned according to current medical practices, but problems sometimes occur. Call your health care provider if you have any problems or questions after your procedure.  WHAT TO EXPECT AFTER THE PROCEDURE  After your procedure, it is common to have:   Light-headedness or dizziness. You may feel faint.   Nausea.   Tiredness.  HOME CARE INSTRUCTIONS  Activities   Return to your normal activities as directed by your health care provider. Most people can go back to their normal activities right away.   Avoid strenuous physical activity and heavy lifting or pulling for about 5 hours after the procedure. Do not lift anything that is heavier than 10 lb (4.5 kg).   Athletes should avoid strenuous exercise for at least 12 hours.   Change positions slowly for the remainder of the day. This will help to prevent light-headedness or fainting.   If you feel light-headed, lie down until the feeling goes away.  Eating and Drinking   Be sure to eat well-balanced meals for the next 24 hours.   Drink enough fluid to keep your urine clear or pale yellow.   Avoid drinking alcohol on the day that you had the procedure.  Care of the Needle Insertion Site   Keep your bandage dry. You can remove the bandage after about 5 hours or as directed by your health care provider.   If you have bleeding from the needle insertion site, elevate your arm and press firmly on the site until the bleeding stops.   If you have bruising at the site, apply ice to the area:   Put ice in a plastic bag.   Place a towel between your skin and the bag.   Leave the ice on for 20 minutes, 2-3 times a day for the first 24 hours.   If the swelling does not go away after 24 hours, apply  a warm, moist washcloth to the area for 20 minutes, 2-3 times a day.  General Instructions   Avoid smoking for at least 30 minutes after the procedure.   Keep all follow-up visits as directed by your health care provider. It is important to continue with further therapeutic phlebotomy treatments as directed.  SEEK MEDICAL CARE IF:   You have redness, swelling, or pain at the needle insertion site.   You have fluid, blood, or pus coming from the needle insertion site.   You feel light-headed, dizzy, or nauseated, and the feeling does not go away.   You notice new bruising at the needle insertion site.   You feel weaker than normal.   You have a fever or chills.  SEEK IMMEDIATE MEDICAL CARE IF:   You have severe nausea or vomiting.   You have chest pain.   You have trouble breathing.    This information is not intended to replace advice given to you by your health care provider. Make sure you discuss any questions you have with your health care provider.    Document Released: 09/20/2010 Document Revised: 09/02/2014 Document Reviewed: 04/14/2014  [employer] Interactive Patient Education 2016 [employer]

## 2016-01-05 NOTE — Progress Notes (Signed)
Carlos [name] presents today for phlebotomy per MD orders. Phlebotomy procedure started at 1541 and ended at 1551. 500 grams removed. Patient observed for 30 minutes after procedure without any incident. Patient tolerated procedure well. IV needle removed intact.

## 2016-01-18 ENCOUNTER — Ambulatory Visit (HOSPITAL_BASED_OUTPATIENT_CLINIC_OR_DEPARTMENT_OTHER): Payer: BLUE CROSS/BLUE SHIELD | Admitting: Hematology & Oncology

## 2016-01-18 ENCOUNTER — Encounter: Payer: Self-pay | Admitting: Hematology & Oncology

## 2016-01-18 ENCOUNTER — Other Ambulatory Visit (HOSPITAL_BASED_OUTPATIENT_CLINIC_OR_DEPARTMENT_OTHER): Payer: BLUE CROSS/BLUE SHIELD

## 2016-01-18 DIAGNOSIS — Z7901 Long term (current) use of anticoagulants: Secondary | ICD-10-CM

## 2016-01-18 LAB — CBC WITH DIFFERENTIAL (CANCER CENTER ONLY)
BASO#: 0 10*3/uL (ref 0.0–0.2)
BASO%: 0.4 % (ref 0.0–2.0)
EOS ABS: 0.1 10*3/uL (ref 0.0–0.5)
EOS%: 1.9 % (ref 0.0–7.0)
HEMATOCRIT: 45.8 % (ref 38.7–49.9)
HEMOGLOBIN: 15.4 g/dL (ref 13.0–17.1)
LYMPH#: 1.8 10*3/uL (ref 0.9–3.3)
LYMPH%: 34.2 % (ref 14.0–48.0)
MCH: 30.1 pg (ref 28.0–33.4)
MCHC: 33.6 g/dL (ref 32.0–35.9)
MCV: 90 fL (ref 82–98)
MONO#: 0.4 10*3/uL (ref 0.1–0.9)
MONO%: 8.3 % (ref 0.0–13.0)
NEUT%: 55.2 % (ref 40.0–80.0)
NEUTROS ABS: 2.9 10*3/uL (ref 1.5–6.5)
Platelets: 250 10*3/uL (ref 145–400)
RBC: 5.11 10*6/uL (ref 4.20–5.70)
RDW: 14.3 % (ref 11.1–15.7)
WBC: 5.2 10*3/uL (ref 4.0–10.0)

## 2016-01-18 LAB — COMPREHENSIVE METABOLIC PANEL (CC13)
A/G RATIO: 1.5 (ref 1.2–2.2)
ALBUMIN: 3.9 g/dL (ref 3.5–5.5)
ALK PHOS: 155 IU/L — AB (ref 39–117)
ALT: 21 IU/L (ref 0–44)
AST (SGOT): 18 IU/L (ref 0–40)
BILIRUBIN TOTAL: 0.7 mg/dL (ref 0.0–1.2)
BUN / CREAT RATIO: 11 (ref 9–20)
BUN: 19 mg/dL (ref 6–24)
CHLORIDE: 99 mmol/L (ref 96–106)
Calcium, Ser: 9.4 mg/dL (ref 8.7–10.2)
Carbon Dioxide, Total: 27 mmol/L (ref 18–29)
Creatinine, Ser: 1.66 mg/dL — ABNORMAL HIGH (ref 0.76–1.27)
GFR calc non Af Amer: 47 mL/min/{1.73_m2} — ABNORMAL LOW (ref >59)
GFR, EST AFRICAN AMERICAN: 54 mL/min/{1.73_m2} — AB (ref >59)
GLOBULIN, TOTAL: 2.6 g/dL (ref 1.5–4.5)
Glucose: 350 mg/dL — ABNORMAL HIGH (ref 65–99)
Potassium, Ser: 4.4 mmol/L (ref 3.5–5.2)
SODIUM: 134 mmol/L (ref 134–144)
TOTAL PROTEIN: 6.5 g/dL (ref 6.0–8.5)

## 2016-01-18 LAB — TECHNOLOGIST REVIEW CHCC SATELLITE

## 2016-01-18 NOTE — Progress Notes (Signed)
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 His numbers today are 20.9/58.5.  He is taking an 81 mg Aspirin daily. BP is stable at this time.  His alk/phos is quite elevated at 254, AST is up at 64. He denies any abdominal pain or "fullness." No organomegaly found on assessment. He denies any personal or familial history of cirrhosis or liver cancer.  No personal history of blood clots. His father was recently diagnosed with a PE and his sister has history of DVT and PE.  Mother and paternal grandfather history of heart attack.  He has testosterone deficiency and received injections every 2 weeks.  He is diabetic and takes Metformin and Victoza. He states the his BS averages 180-185. He is 262 at this time.  No swelling, tenderness, numbness or tingling in his extremities. No new aches or pains.  He has maintained a good appetite and is staying well hydrated. Weight is stable. No significant loss or gain.  He does not smoke or drink alcohol.  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   ROS: All other 10 point review of systems is negative.   PAST MEDICAL HISTORY:   Past Medical History:  Diagnosis Date  . History of kidney stones   . Hypogonadism male   . Left ureteral stone   . OSA on CPAP   . Right bundle branch block   . Type 2 diabetes mellitus (HCC)     ALLERGIES: No Known Allergies    MEDICATIONS:  Current Outpatient Prescriptions on File Prior to Visit  Medication Sig Dispense Refill  . aspirin EC 81 MG tablet Take 81 mg by mouth daily.    .  Cholecalciferol (VITAMIN D-3) 5000 UNITS TABS Take 5,000 Units by mouth daily.    . empagliflozin (JARDIANCE) 25 MG TABS tablet Take 25 mg by mouth daily. 90 tablet 1  . Liraglutide (VICTOZA) 18 MG/3ML SOPN INJECT 1.8 MGS DAILY AS DIRECTED 9 pen 2  . metFORMIN (GLUCOPHAGE-XR) 500 MG 24 hr tablet TAKE 4 TABLETS (2,000 MG TOTAL) BY MOUTH DAILY. 120 tablet 1  . testosterone cypionate (DEPOTESTOSTERONE CYPIONATE) 200 MG/ML injection INJECT 1.5MLS INTRAMUSCULARLY EVERY 2 WEEKS 10 mL 3  . VIAGRA 100 MG tablet TAKE 1/2 TO 1 TABLET ONCE DAILY AS NEEDED 6 tablet 11   No current facility-administered medications on file prior to visit.      PAST SURGICAL HISTORY Past Surgical History:  Procedure Laterality Date  . ANKLE SURGERY Left 2002  . CYSTOSCOPY W/ RETROGRADES Left 12/05/2014   Procedure: CYSTOSCOPY WITH RETROGRADE PYELOGRAM;  Surgeon: Ardis Hughs, MD;  Location: [facility];  Service: Urology;  Laterality: Left;  . CYSTOSCOPY/RETROGRADE/URETEROSCOPY/STONE EXTRACTION WITH BASKET Left 12/05/2014   Procedure: LEFT  URETEROSCOPY, STONE EXTRACTION ;  Surgeon: Ardis Hughs, MD;  Location: [facility];  Service: Urology;  Laterality: Left;  . EXTRACORPOREAL SHOCK WAVE LITHOTRIPSY Left 09-25-2014  . RHINOPLASTY  2001    FAMILY HISTORY: Family History  Problem Relation Age of Onset  . Hypertension Mother   .  Cancer Mother     breast cancer  . Diabetes Father     SOCIAL HISTORY:  reports that he has never smoked. He has never used smokeless tobacco. He reports that he drinks alcohol. He reports that he does not use drugs.  PERFORMANCE STATUS: The patient's performance status is 1 - Symptomatic but completely ambulatory  PHYSICAL EXAM: Most Recent Vital Signs: Blood pressure 135/71, pulse 90, temperature 98.5 F (36.9 C), temperature source Oral, resp. rate 16, height 6' 1.5" (1.867 m), weight 287 lb (130.2 kg). BP 135/71 (BP Location: Left Arm,  Patient Position: Sitting)   Pulse 90   Temp 98.5 F (36.9 C) (Oral)   Resp 16   Ht 6' 1.5" (1.867 m)   Wt 287 lb (130.2 kg)   BMI 37.35 kg/m   General Appearance:    Alert, cooperative, no distress, appears stated age  Head:    Normocephalic, without obvious abnormality, atraumatic  Eyes:    PERRL, conjunctiva/corneas clear, EOM's intact, fundi    benign, both eyes             Throat:   Lips, mucosa, and tongue normal; teeth and gums normal  Neck:   Supple, symmetrical, trachea midline, no adenopathy;       thyroid:  No enlargement/tenderness/nodules; no carotid   bruit or JVD  Back:     Symmetric, no curvature, ROM normal, no CVA tenderness  Lungs:     Clear to auscultation bilaterally, respirations unlabored  Chest wall:    No tenderness or deformity  Heart:    Regular rate and rhythm, S1 and S2 normal, no murmur, rub   or gallop  Abdomen:     Soft, non-tender, bowel sounds active all four quadrants,    no masses, no organomegaly        Extremities:   Extremities normal, atraumatic, no cyanosis or edema  Pulses:   2+ and symmetric all extremities  Skin:   Skin color, texture, turgor normal, no rashes or lesions  Lymph nodes:   Cervical, supraclavicular, and axillary nodes normal  Neurologic:   CNII-XII intact. Normal strength, sensation and reflexes      throughout     LABORATORY DATA:  Results for orders placed or performed in visit on 01/18/16 (from the past 48 hour(s))  CBC w/Diff     Status: None   Collection Time: 01/18/16  3:14 PM  Result Value Ref Range   WBC 5.2 4.0 - 10.0 10e3/uL   RBC 5.11 4.20 - 5.70 10e6/uL   HGB 15.4 13.0 - 17.1 g/dL   HCT 45.8 38.7 - 49.9 %   MCV 90 82 - 98 fL   MCH 30.1 28.0 - 33.4 pg   MCHC 33.6 32.0 - 35.9 g/dL   RDW 14.3 11.1 - 15.7 %   Platelets 250 145 - 400 10e3/uL   NEUT# 2.9 1.5 - 6.5 10e3/uL   LYMPH# 1.8 0.9 - 3.3 10e3/uL   MONO# 0.4 0.1 - 0.9 10e3/uL   Eosinophils Absolute 0.1 0.0 - 0.5 10e3/uL   BASO# 0.0 0.0 - 0.2  10e3/uL   NEUT% 55.2 40.0 - 80.0 %   LYMPH% 34.2 14.0 - 48.0 %   MONO% 8.3 0.0 - 13.0 %   EOS% 1.9 0.0 - 7.0 %   BASO% 0.4 0.0 - 2.0 %  TECHNOLOGIST REVIEW [facility]     Status: None   Collection Time: 01/18/16  3:14 PM  Result Value Ref Range   Tech Review Rare  meta       RADIOGRAPHY: No results found.     PATHOLOGY: None   ASSESSMENT/PLAN: Mr. Cull is a very pleasant 55 yo African American male with new diagnosis of hemochromatosis and elevated Hgb/Hct. His numbers today are 20.9/58.5. We will phlebotomize him today and then get him on a weekly phlebotomy program for the next 5 weeks.  His BP is stable at this time. His LFT's are elevated. We will get and abdominal US this week to assess his liver.  He will continue on his 81 mg Aspirin daily.  We will see what all his lab work shows. I have also added a testosterone level.  We will go ahead and plan to see him back in 6 weeks for repeat lab work and follow-up.  All questions were answered. He will contact our office with any problems, questions or concerns. We can certainly see the patient much sooner if necessary.  He was discussed with and also seen by Dr. Marin Olp and he is in agreement with the aforementioned.   ENNEVER,PETER R    Addendum:  I saw and examined patient with Sarah. I looked at his blood smear. I did not see anything that looked suspicious on the blood smear. I did not see any argot cells. He had no nucleated red blood cells. There were no teardrop cells. White cells were normal in morphology and maturation. He had no hypersegmented polys. There were no immature lymphoid cells. He has normal platelets.  I do believe that this erythrocytosis is probably from his testosterone. We see this quite often.  He also has hemochromatosis. This actually should not be much of an issue. He is heterozygous for the minor mutation, H63D.  His iron studies back in mid July showed a ferritin of 200 with an iron saturation  of 37%.  I the weekend clearly phlebotomize him. I think he does have significant erythrocytosis. I would be surprised if he has a myeloproliferative disorder. However, we are checking his JAK2 mutation.  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 It is truly a pleasure to be able to help him.  He is on a baby aspirin. I think he is to continue this.  I do think that we have to begin phlebotomizing him now. I think we need to keep his hematocrit below 45%.  We will plan to get back to see [place] in about 6 weeks.  We spent about 40-45 minutes with him.  Laurey Arrow E     Addendum: I spoke with Mr. Malek today while he was here for a phlebotomy and let him know that his testosterone was still low and his abdominal US was negative.    Eliezer Bottom, NP 12/14/15 14:40

## 2016-01-18 NOTE — Progress Notes (Signed)
Hematology and Oncology Follow Up Visit  Carlos [name] YO:[id number] 1961/01/24 55 y.o. 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 We first saw him back in early August. At that point time, his hematocrit was 58.5. His hemoglobin was 20.9. His white cell count was 5.7 and platelet count 175K.  She possibly, his erythropoietin was only 5. His viral B-12 was over thousand.  His ferritin was 533. His iron saturation was 85%.  We did a hemoglobin electrophoresis on him. This was normal.  He did have a markedly elevated blood sugar.  His LDH was 208.  We went ahead and phlebotomize him. We had a get his hematocrit down. We did start him on aspirin. He is on baby aspirin.  He has been phlebotomize 5 times to date.  He is still taking testosterone but only on a limited basis. His testosterone level back in early August was only 135.  He feels pretty good. He has no headache. He has no nausea or vomiting. He's had no pain in his hands or feet. He's had no fever. He's had no cough or shortness of breath.  Overall, his performance status is [name].  Medications:  Current Outpatient Prescriptions:  .  aspirin EC 81 MG tablet, Take 81 mg by mouth daily., Disp: , Rfl:  .  Cholecalciferol (VITAMIN D-3) 5000 UNITS TABS, Take 5,000 Units by mouth daily., Disp: , Rfl:  .  empagliflozin (JARDIANCE) 25 MG TABS tablet, Take 25 mg by mouth daily., Disp: 90 tablet, Rfl: 1 .  Liraglutide (VICTOZA) 18 MG/3ML SOPN, INJECT 1.8 MGS DAILY AS DIRECTED, Disp: 9 pen, Rfl: 2 .  metFORMIN (GLUCOPHAGE-XR) 500 MG 24 hr tablet, TAKE 4 TABLETS (2,000 MG TOTAL) BY MOUTH DAILY., Disp: 120 tablet, Rfl: 1 .  testosterone cypionate (DEPOTESTOSTERONE CYPIONATE) 200 MG/ML injection,  INJECT 1.5MLS INTRAMUSCULARLY EVERY 2 WEEKS, Disp: 10 mL, Rfl: 3 .  VIAGRA 100 MG tablet, TAKE 1/2 TO 1 TABLET ONCE DAILY AS NEEDED, Disp: 6 tablet, Rfl: 11  Allergies: No Known Allergies  Past Medical History, Surgical history, Social history, and Family History were reviewed and updated.  Review of Systems:  As above  Physical Exam:  height is 6' 1.5" (1.867 m) and weight is 287 lb (130.2 kg). His oral temperature is 98.5 F (36.9 C). His blood pressure is 135/71 and his pulse is 90. His respiration is 16.   Wt Readings from Last 3 Encounters:  01/18/16 287 lb (130.2 kg)  12/07/15 277 lb (125.6 kg)  11/10/15 290 lb 6.4 oz (131.7 kg)     Well-developed well-nourished African-American male in no obvious distress. Head and neck exam shows no ocular or oral lesions. He has no scleral icterus. There is no adenopathy in the neck. Thyroid is not palpable. Lungs are clear bilaterally. Cardiac exam regular rate and rhythm with no murmurs, rubs or bruits. Abdomen is soft. Has good bowel sounds. There is no fluid wave. There is no palpable liver or spleen tip. Back exam shows no tenderness over the spine, ribs or hips. External he shows no clubbing, cyanosis or edema. Neurological exam shows no focal neurological deficits. Skin exam shows no rashes, ecchymoses or petechia.  Lab Results  Component Value Date  WBC 5.2 01/18/2016   HGB 15.4 01/18/2016   HCT 45.8 01/18/2016   MCV 90 01/18/2016   PLT 250 01/18/2016     Chemistry      Component Value Date/Time   NA 132 12/07/2015 1519   K 4.5 12/07/2015 1519   CL 101 12/07/2015 1519   CO2 24 12/07/2015 1519   BUN 28 (H) 12/07/2015 1519   CREATININE 1.3 (H) 12/07/2015 1519      Component Value Date/Time   CALCIUM 9.8 12/07/2015 1519   ALKPHOS 254 (H) 12/07/2015 1519   AST 32 12/07/2015 1519   ALT 64 (H) 12/07/2015 1519   BILITOT 1.50 12/07/2015 1519         Impression and Plan: Mr. Carlos [name] is A 55 year old African-American male.  He has 2 separate hematologic issues. He does have hemochromatosis. He is heterozygous for a minor mutation. I don't think this will be a problem for him.  His erythrocytosis is much of an issue. His erythropoietin level is only 5. This is quite low. As such, I just wonder if he may not have some element of polycythemia. His white cells and platelet was okay. He does have an elevated vitamin B-12.  I'm checking a JAK2 assay on him. I'll have to make sure that this was sent off.  We'll see what his iron studies show.  His hematocrit is on 45.8. I will I told off on phlebotomize him right now.  I will like to see him back in 6 weeks. I think this would be reasonable.   Carlos Napoleon, MD 9/18/20174:28 PM

## 2016-02-17 ENCOUNTER — Encounter: Payer: Self-pay | Admitting: Internal Medicine

## 2016-02-17 ENCOUNTER — Ambulatory Visit (INDEPENDENT_AMBULATORY_CARE_PROVIDER_SITE_OTHER): Payer: BLUE CROSS/BLUE SHIELD | Admitting: Internal Medicine

## 2016-02-17 VITALS — BP 128/66 | HR 88 | Temp 98.4°F | Resp 18 | Ht 73.5 in | Wt 282.0 lb

## 2016-02-17 DIAGNOSIS — E559 Vitamin D deficiency, unspecified: Secondary | ICD-10-CM

## 2016-02-17 DIAGNOSIS — E782 Mixed hyperlipidemia: Secondary | ICD-10-CM

## 2016-02-17 DIAGNOSIS — E349 Endocrine disorder, unspecified: Secondary | ICD-10-CM

## 2016-02-17 DIAGNOSIS — I10 Essential (primary) hypertension: Secondary | ICD-10-CM

## 2016-02-17 DIAGNOSIS — Z79899 Other long term (current) drug therapy: Secondary | ICD-10-CM

## 2016-02-17 DIAGNOSIS — N183 Chronic kidney disease, stage 3 (moderate): Secondary | ICD-10-CM

## 2016-02-17 DIAGNOSIS — R7309 Other abnormal glucose: Secondary | ICD-10-CM | POA: Diagnosis not present

## 2016-02-17 DIAGNOSIS — E1122 Type 2 diabetes mellitus with diabetic chronic kidney disease: Secondary | ICD-10-CM

## 2016-02-17 LAB — CBC WITH DIFFERENTIAL/PLATELET
Basophils Absolute: 57 cells/uL (ref 0–200)
Basophils Relative: 1 %
EOS ABS: 171 {cells}/uL (ref 15–500)
Eosinophils Relative: 3 %
HEMATOCRIT: 53.3 % — AB (ref 38.5–50.0)
Hemoglobin: 17.5 g/dL — ABNORMAL HIGH (ref 13.2–17.1)
LYMPHS PCT: 33 %
Lymphs Abs: 1881 cells/uL (ref 850–3900)
MCH: 29.1 pg (ref 27.0–33.0)
MCHC: 32.8 g/dL (ref 32.0–36.0)
MCV: 88.7 fL (ref 80.0–100.0)
MONO ABS: 285 {cells}/uL (ref 200–950)
MPV: 12.2 fL (ref 7.5–12.5)
Monocytes Relative: 5 %
NEUTROS PCT: 58 %
Neutro Abs: 3306 cells/uL (ref 1500–7800)
Platelets: 226 10*3/uL (ref 140–400)
RBC: 6.01 MIL/uL — ABNORMAL HIGH (ref 4.20–5.80)
RDW: 13.8 % (ref 11.0–15.0)
WBC: 5.7 10*3/uL (ref 3.8–10.8)

## 2016-02-17 LAB — TSH: TSH: 0.72 m[IU]/L (ref 0.40–4.50)

## 2016-02-17 NOTE — Progress Notes (Signed)
Assessment and Plan:  Hypertension:  -Continue medication,  -monitor blood pressure at home.  -Continue DASH diet.   -Reminder to go to the ER if any CP, SOB, nausea, dizziness, severe HA, changes vision/speech, left arm numbness and tingling, and jaw pain.  Cholesterol: -Continue diet and exercise.  -Check cholesterol.   Diabetes uncontrolled: -numbers not much improved -is maxed out on meds -likely will need insulin soon -Continue diet and exercise.  -Check A1C  Vitamin D Def: -check level -continue medications.   Testosterone def -restarted testosterone  Hemochromatosis -needs another phlebotomy appointment in 6 weeks with Dr. Marin Olp  Continue diet and meds as discussed. Further disposition pending results of labs.  HPI 55 y.o. male  presents for 3 month follow up with hypertension, hyperlipidemia, prediabetes and vitamin D.   His blood pressure has been controlled at home, today their BP is BP: 128/66.   He does workout. He denies chest pain, shortness of breath, dizziness.   He is on cholesterol medication and denies myalgias. His cholesterol is at goal. The cholesterol last visit was:   Lab Results  Component Value Date   CHOL 149 11/10/2015   HDL 53 11/10/2015   LDLCALC 58 11/10/2015   TRIG 190 (H) 11/10/2015   CHOLHDL 2.8 11/10/2015     He has not been working on diet and exercise for prediabetes, and denies foot ulcerations, hyperglycemia, hypoglycemia , increased appetite, nausea, paresthesia of the feet, polydipsia, polyuria, visual disturbances, vomiting and weight loss. Last A1C in the office was:  Lab Results  Component Value Date   HGBA1C 9.1 (H) 11/10/2015  He reports that on average his blood sugars are running around 150-160.  He is taking his medications as recommended.  He is not working on diet.  Patient is on Vitamin D supplement.  Lab Results  Component Value Date   VD25OH 96 08/05/2015     He reports that he is doing well with his  hemachromatosis.  He is having another phlebotomy appointment in 6 weeks.  He remains asymptomatic.    He was allowed to restart testosterone per Dr. Marin Olp.  He reports that he is happy to be on it.  He did get a flu shot at work.    Current Medications:  Current Outpatient Prescriptions on File Prior to Visit  Medication Sig Dispense Refill  . aspirin EC 81 MG tablet Take 81 mg by mouth daily.    . Cholecalciferol (VITAMIN D-3) 5000 UNITS TABS Take 5,000 Units by mouth daily.    . empagliflozin (JARDIANCE) 25 MG TABS tablet Take 25 mg by mouth daily. 90 tablet 1  . Liraglutide (VICTOZA) 18 MG/3ML SOPN INJECT 1.8 MGS DAILY AS DIRECTED 9 pen 2  . metFORMIN (GLUCOPHAGE-XR) 500 MG 24 hr tablet TAKE 4 TABLETS (2,000 MG TOTAL) BY MOUTH DAILY. 120 tablet 1  . testosterone cypionate (DEPOTESTOSTERONE CYPIONATE) 200 MG/ML injection INJECT 1.5MLS INTRAMUSCULARLY EVERY 2 WEEKS 10 mL 3  . VIAGRA 100 MG tablet TAKE 1/2 TO 1 TABLET ONCE DAILY AS NEEDED 6 tablet 11   No current facility-administered medications on file prior to visit.     Medical History:  Past Medical History:  Diagnosis Date  . History of kidney stones   . Hypogonadism male   . Left ureteral stone   . OSA on CPAP   . Right bundle branch block   . Type 2 diabetes mellitus (HCC)     Allergies: No Known Allergies   Review of Systems:  Review  of Systems  Constitutional: Negative for chills, fever and malaise/fatigue.  HENT: Negative for congestion, ear pain and sore throat.   Eyes: Negative.   Respiratory: Negative for cough, shortness of breath and wheezing.   Cardiovascular: Negative for chest pain, palpitations and leg swelling.  Gastrointestinal: Negative for abdominal pain, blood in stool, constipation, diarrhea, heartburn and melena.  Genitourinary: Negative.   Skin: Negative.   Neurological: Negative for dizziness, sensory change, loss of consciousness and headaches.  Psychiatric/Behavioral: Negative for  depression. The patient is not nervous/anxious and does not have insomnia.     Family history- Review and unchanged  Social history- Review and unchanged  Physical Exam: BP 128/66   Pulse 88   Temp 98.4 F (36.9 C) (Temporal)   Resp 18   Ht 6' 1.5" (1.867 m)   Wt 282 lb (127.9 kg)   BMI 36.70 kg/m  Wt Readings from Last 3 Encounters:  02/17/16 282 lb (127.9 kg)  01/18/16 287 lb (130.2 kg)  12/07/15 277 lb (125.6 kg)    General Appearance: Well nourished well developed, in no apparent distress. Eyes: PERRLA, EOMs, conjunctiva no swelling or erythema ENT/Mouth: Ear canals normal without obstruction, swelling, erythma, discharge.  TMs normal bilaterally.  Oropharynx moist, clear, without exudate, or postoropharyngeal swelling. Neck: Supple, thyroid normal,no cervical adenopathy  Respiratory: Respiratory effort normal, Breath sounds clear A&P without rhonchi, wheeze, or rale.  No retractions, no accessory usage. Cardio: RRR with no MRGs. Brisk peripheral pulses without edema.  Abdomen: Soft, + BS,  Non tender, no guarding, rebound, hernias, masses. Musculoskeletal: Full ROM, 5/5 strength, Normal gait Skin: Warm, dry without rashes, lesions, ecchymosis.  Neuro: Awake and oriented X 3, Cranial nerves intact. Normal muscle tone, no cerebellar symptoms. Psych: Normal affect, Insight and Judgment appropriate.    Starlyn Skeans, PA-C 4:46 PM [facility]

## 2016-02-18 LAB — LIPID PANEL
Cholesterol: 179 mg/dL (ref 125–200)
HDL: 36 mg/dL — AB (ref >=40)
Total CHOL/HDL Ratio: 5 Ratio (ref <=5.0)
Triglycerides: 606 mg/dL — ABNORMAL HIGH (ref <150)

## 2016-02-18 LAB — HEPATIC FUNCTION PANEL
ALBUMIN: 4.2 g/dL (ref 3.6–5.1)
ALK PHOS: 201 U/L — AB (ref 40–115)
ALT: 27 U/L (ref 9–46)
AST: 17 U/L (ref 10–35)
Bilirubin, Direct: 0.1 mg/dL (ref <=0.2)
Indirect Bilirubin: 0.9 mg/dL (ref 0.2–1.2)
TOTAL PROTEIN: 6.8 g/dL (ref 6.1–8.1)
Total Bilirubin: 1 mg/dL (ref 0.2–1.2)

## 2016-02-18 LAB — BASIC METABOLIC PANEL WITH GFR
BUN: 18 mg/dL (ref 7–25)
CALCIUM: 10.1 mg/dL (ref 8.6–10.3)
CO2: 21 mmol/L (ref 20–31)
Chloride: 99 mmol/L (ref 98–110)
Creat: 1.6 mg/dL — ABNORMAL HIGH (ref 0.70–1.33)
GFR, EST AFRICAN AMERICAN: 56 mL/min — AB (ref >=60)
GFR, Est Non African American: 48 mL/min — ABNORMAL LOW (ref >=60)
GLUCOSE: 430 mg/dL — AB (ref 65–99)
POTASSIUM: 4.1 mmol/L (ref 3.5–5.3)
Sodium: 135 mmol/L (ref 135–146)

## 2016-02-18 LAB — HEMOGLOBIN A1C
HEMOGLOBIN A1C: 12.2 % — AB (ref <5.7)
Mean Plasma Glucose: 303 mg/dL

## 2016-02-29 ENCOUNTER — Ambulatory Visit (HOSPITAL_BASED_OUTPATIENT_CLINIC_OR_DEPARTMENT_OTHER): Payer: BLUE CROSS/BLUE SHIELD

## 2016-02-29 ENCOUNTER — Other Ambulatory Visit (HOSPITAL_BASED_OUTPATIENT_CLINIC_OR_DEPARTMENT_OTHER): Payer: BLUE CROSS/BLUE SHIELD

## 2016-02-29 ENCOUNTER — Ambulatory Visit (HOSPITAL_BASED_OUTPATIENT_CLINIC_OR_DEPARTMENT_OTHER): Payer: BLUE CROSS/BLUE SHIELD | Admitting: Hematology & Oncology

## 2016-02-29 LAB — CBC WITH DIFFERENTIAL (CANCER CENTER ONLY)
BASO#: 0.1 10*3/uL (ref 0.0–0.2)
BASO%: 1.1 % (ref 0.0–2.0)
EOS ABS: 0.2 10*3/uL (ref 0.0–0.5)
EOS%: 3.2 % (ref 0.0–7.0)
HEMATOCRIT: 53.5 % — AB (ref 38.7–49.9)
HEMOGLOBIN: 18.3 g/dL — AB (ref 13.0–17.1)
LYMPH#: 1.7 10*3/uL (ref 0.9–3.3)
LYMPH%: 35 % (ref 14.0–48.0)
MCH: 29.2 pg (ref 28.0–33.4)
MCHC: 34.2 g/dL (ref 32.0–35.9)
MCV: 86 fL (ref 82–98)
MONO#: 0.4 10*3/uL (ref 0.1–0.9)
MONO%: 7.6 % (ref 0.0–13.0)
NEUT%: 53.1 % (ref 40.0–80.0)
NEUTROS ABS: 2.5 10*3/uL (ref 1.5–6.5)
Platelets: 201 10*3/uL (ref 145–400)
RBC: 6.26 10*6/uL — ABNORMAL HIGH (ref 4.20–5.70)
RDW: 13.4 % (ref 11.1–15.7)
WBC: 4.7 10*3/uL (ref 4.0–10.0)

## 2016-02-29 NOTE — Patient Instructions (Signed)

## 2016-03-01 LAB — RETICULOCYTES: Reticulocyte Count: 1.3 % (ref 0.6–2.6)

## 2016-03-01 LAB — IRON AND TIBC
%SAT: 30 % (ref 20–55)
Iron: 85 ug/dL (ref 42–163)
TIBC: 284 ug/dL (ref 202–409)
UIBC: 198 ug/dL (ref 117–376)

## 2016-03-01 LAB — FERRITIN: FERRITIN: 62 ng/mL (ref 22–316)

## 2016-03-01 NOTE — Addendum Note (Signed)
Addended by: Burney Gauze R on: 03/01/2016 04:04 PM   Modules accepted: Orders

## 2016-03-01 NOTE — Progress Notes (Signed)
Hematology and Oncology Follow Up Visit  Carlos [name] [id number] [birth year] 55 y.o. 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 We first saw him back in September. At that point in time, his ferritin was 533 with an iron saturation of 85%.  He's been phlebotomized. He buys up in [place] my for about 6 months.  He does have hemochromatosis. He has only minor mutation. As such, I'm a sure this really is coming that much of a problem for [place].  He does get supplemental testosterone. This is via injection. He needs this. I'm sure this is what is driving his hemoglobin.  He has had no headache. He's had no dizziness. He's had no increase in fatigue or weakness.  There's been no rashes. He's had no change in bowel or bladder habits.  He's had no nausea or vomiting.  He does have diabetes. His blood sugars have not been all that well controlled.  He did have a ultrasound of the abdomen back in August. There is no obvious cirrhosis. Had a normal spleen size. There were no gallstones.  Overall, his performance status is [name].  Medications:  Current Outpatient Prescriptions:  .  aspirin EC 81 MG tablet, Take 81 mg by mouth daily., Disp: , Rfl:  .  Cholecalciferol (VITAMIN D-3) 5000 UNITS TABS, Take 5,000 Units by mouth daily., Disp: , Rfl:  .  empagliflozin (JARDIANCE) 25 MG TABS tablet, Take 25 mg by mouth daily., Disp: 90 tablet, Rfl: 1 .  Liraglutide (VICTOZA) 18 MG/3ML SOPN, INJECT 1.8 MGS DAILY AS DIRECTED, Disp: 9 pen, Rfl: 2 .  metFORMIN (GLUCOPHAGE-XR) 500 MG 24 hr tablet, TAKE 4 TABLETS (2,000 MG TOTAL) BY MOUTH DAILY., Disp: 120 tablet, Rfl: 1 .  testosterone cypionate (DEPOTESTOSTERONE CYPIONATE)  200 MG/ML injection, INJECT 1.5MLS INTRAMUSCULARLY EVERY 2 WEEKS, Disp: 10 mL, Rfl: 3 .  VIAGRA 100 MG tablet, TAKE 1/2 TO 1 TABLET ONCE DAILY AS NEEDED, Disp: 6 tablet, Rfl: 11  Allergies: No Known Allergies  Past Medical History, Surgical history, Social history, and Family History were reviewed and updated.  Review of Systems: As above  Physical Exam:  oral temperature is 98.2 F (36.8 C). His blood pressure is 122/68 and his pulse is 79. His respiration is 16 and oxygen saturation is 99%.   Wt Readings from Last 3 Encounters:  02/17/16 282 lb (127.9 kg)  01/18/16 287 lb (130.2 kg)  12/07/15 277 lb (125.6 kg)     Well-developed and well-nourished African [place] male in no obvious distress. Head and neck exam shows no ocular or oral lesions. There are no palpable cervical or supraclavicular lymph nodes. He has no scleral icterus. His lungs are clear bilaterally. Cardiac exam regular rate and rhythm with no murmurs, rubs or bruits. Abdomen is soft. He has good bowel sounds. There is no fluid wave. There is no palpable liver or spleen tip. Back exam shows no tenderness over the spine, ribs or hips. Extremity shows no clubbing, cyanosis or edema. Neurological exam shows no focal neurological deficits. Skin exam shows no rashes, ecchymoses or petechia.   Lab Results  Component Value Date   WBC 4.7 02/29/2016   HGB 18.3 (H)  02/29/2016   HCT 53.5 (H) 02/29/2016   MCV 86 02/29/2016   PLT 201 02/29/2016     Chemistry      Component Value Date/Time   NA 135 02/17/2016 1659   NA 134 01/18/2016 1514   NA 132 12/07/2015 1519   K 4.1 02/17/2016 1659   K 4.4 01/18/2016 1514   K 4.5 12/07/2015 1519   CL 99 02/17/2016 1659   CL 99 01/18/2016 1514   CL 101 12/07/2015 1519   CO2 21 02/17/2016 1659   CO2 27 01/18/2016 1514   CO2 24 12/07/2015 1519   BUN 18 02/17/2016 1659   BUN 19 01/18/2016 1514   BUN 28 (H) 12/07/2015 1519   CREATININE 1.60 (H) 02/17/2016 1659      Component  Value Date/Time   CALCIUM 10.1 02/17/2016 1659   CALCIUM 9.4 01/18/2016 1514   CALCIUM 9.8 12/07/2015 1519   ALKPHOS 201 (H) 02/17/2016 1659   ALKPHOS 155 (H) 01/18/2016 1514   ALKPHOS 254 (H) 12/07/2015 1519   AST 17 02/17/2016 1659   AST 18 01/18/2016 1514   AST 32 12/07/2015 1519   ALT 27 02/17/2016 1659   ALT 21 01/18/2016 1514   ALT 64 (H) 12/07/2015 1519   BILITOT 1.0 02/17/2016 1659   BILITOT 0.7 01/18/2016 1514   BILITOT 1.50 12/07/2015 1519         Impression and Plan: Carlos [name] is A 55 year old African American male. He has erythrocytosis. This is clearly from the testosterone supplement. He definitely needs this.  I'm sure that his iron studies are still the higher side. I think that iron saturation would be the best way to gauge iron deficiency with him. I think that there will was be some elevation of ferritin as an acute phase reactant. He does have diabetes. He is at risk for hepatic steatosis. This will cause his ferritin to be on the higher side.  I will go ahead and phlebotomize him today. His hematocrit is quite high. Again, this is all from his testosterone supplementation.  We'll see what his iron studies show.  We need to keep in mind that his hematocrit also needs to be controlled area and if not, then he might be at a high risk for cerebrovascular or cardiovascular events.  Of note, he is taking a baby aspirin daily. I did this will certainly help him out.  Hopefully, his blood sugars will be under better control.  As far as getting him back to the office, this will be dictated by his iron studies.   Volanda Napoleon, MD 10/31/20177:12 AM

## 2016-03-10 ENCOUNTER — Other Ambulatory Visit: Payer: Self-pay | Admitting: Physician Assistant

## 2016-03-10 NOTE — Telephone Encounter (Signed)
Message was sent to office to: Return in about 3 months (around 05/19/2016) Per office note.

## 2016-03-30 ENCOUNTER — Ambulatory Visit (HOSPITAL_BASED_OUTPATIENT_CLINIC_OR_DEPARTMENT_OTHER): Payer: BLUE CROSS/BLUE SHIELD | Admitting: Hematology & Oncology

## 2016-03-30 ENCOUNTER — Other Ambulatory Visit (HOSPITAL_BASED_OUTPATIENT_CLINIC_OR_DEPARTMENT_OTHER): Payer: BLUE CROSS/BLUE SHIELD

## 2016-03-30 ENCOUNTER — Ambulatory Visit (HOSPITAL_BASED_OUTPATIENT_CLINIC_OR_DEPARTMENT_OTHER): Payer: BLUE CROSS/BLUE SHIELD

## 2016-03-30 DIAGNOSIS — E349 Endocrine disorder, unspecified: Secondary | ICD-10-CM

## 2016-03-30 DIAGNOSIS — E119 Type 2 diabetes mellitus without complications: Secondary | ICD-10-CM

## 2016-03-30 DIAGNOSIS — E1121 Type 2 diabetes mellitus with diabetic nephropathy: Secondary | ICD-10-CM

## 2016-03-30 DIAGNOSIS — R700 Elevated erythrocyte sedimentation rate: Secondary | ICD-10-CM

## 2016-03-30 LAB — CBC WITH DIFFERENTIAL (CANCER CENTER ONLY)
BASO#: 0.1 10*3/uL (ref 0.0–0.2)
BASO%: 1.3 % (ref 0.0–2.0)
EOS ABS: 0.1 10*3/uL (ref 0.0–0.5)
EOS%: 3.6 % (ref 0.0–7.0)
HCT: 52 % — ABNORMAL HIGH (ref 38.7–49.9)
HEMOGLOBIN: 17.8 g/dL — AB (ref 13.0–17.1)
LYMPH#: 1.5 10*3/uL (ref 0.9–3.3)
LYMPH%: 37.8 % (ref 14.0–48.0)
MCH: 29.5 pg (ref 28.0–33.4)
MCHC: 34.2 g/dL (ref 32.0–35.9)
MCV: 86 fL (ref 82–98)
MONO#: 0.4 10*3/uL (ref 0.1–0.9)
MONO%: 11.3 % (ref 0.0–13.0)
NEUT#: 1.8 10*3/uL (ref 1.5–6.5)
NEUT%: 46 % (ref 40.0–80.0)
Platelets: 191 10*3/uL (ref 145–400)
RBC: 6.04 10*6/uL — ABNORMAL HIGH (ref 4.20–5.70)
RDW: 13.3 % (ref 11.1–15.7)
WBC: 3.9 10*3/uL — ABNORMAL LOW (ref 4.0–10.0)

## 2016-03-30 NOTE — Progress Notes (Signed)
Hematology and Oncology Follow Up Visit  Carlos [name] [id number] Nov 28, 1960 55 y.o. 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 We last saw him back in late October. He says he is not had any testosterone since.  I think his big problem are the blood sugars that are under very poor control. I told that his family doctor really needs to help out with this. I told him that if he were to develop a fatty liver from point controlled diabetes, I would be a much bigger problem than erythrocytosis.   I really don't think that hemochromatosis is an issue. He only has a minor mutation that he is heterozygote for. Back in October, as ferritin was 62 with an iron saturation of 30%.   He has had no problems with headache. He's had no cough or shortness of breath. He's had no joint problems.   He is working. He's pretty busy at work.   He had a good Thanksgiving. He watched a lot of football. 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 He loves the [employer] in the [place].  Overall, his performance status is [name].  Medications:  Current Outpatient Prescriptions:  .  aspirin EC 81 MG tablet, Take 81 mg by mouth daily., Disp: , Rfl:  .  Cholecalciferol (VITAMIN D-3) 5000 UNITS TABS, Take 5,000 Units by mouth daily., Disp: , Rfl:  .  empagliflozin (JARDIANCE) 25 MG TABS tablet, Take 25 mg by mouth daily., Disp: 90 tablet, Rfl: 1 .  Liraglutide (VICTOZA) 18 MG/3ML SOPN, INJECT 1.8 MGS DAILY AS DIRECTED, Disp: 9 pen, Rfl: 2 .  lisinopril (PRINIVIL,ZESTRIL) 20 MG tablet, Take 20 mg by mouth daily., Disp: , Rfl: 0 .  metFORMIN (GLUCOPHAGE-XR) 500 MG 24 hr tablet, TAKE 4 TABLETS (2,000 MG TOTAL) BY MOUTH DAILY., Disp: 120 tablet, Rfl:  0 .  testosterone cypionate (DEPOTESTOSTERONE CYPIONATE) 200 MG/ML injection, INJECT 1.5MLS INTRAMUSCULARLY EVERY 2 WEEKS, Disp: 10 mL, Rfl: 3 .  VIAGRA 100 MG tablet, TAKE 1/2 TO 1 TABLET ONCE DAILY AS NEEDED, Disp: 6 tablet, Rfl: 11  Allergies: No Known Allergies  Past Medical History, Surgical history, Social history, and Family History were reviewed and updated.  Review of Systems: As above  Physical Exam:  weight is 283 lb (128.4 kg). His oral temperature is 98.5 F (36.9 C). His blood pressure is 121/73 and his pulse is 76. His respiration is 16.   Wt Readings from Last 3 Encounters:  03/30/16 283 lb (128.4 kg)  02/17/16 282 lb (127.9 kg)  01/18/16 287 lb (130.2 kg)     Well-developed and well-nourished [place] male in no obvious distress. Head and neck exam shows no ocular or oral lesions. There are no palpable cervical or supraclavicular lymph nodes. He has no scleral icterus. His lungs are clear bilaterally. Cardiac exam regular rate and rhythm with no murmurs, rubs or bruits. Abdomen is soft. He has good bowel sounds. There is no fluid wave. There is no palpable liver or spleen tip. Back exam shows no tenderness over the spine, ribs or hips. Extremity shows no clubbing, cyanosis or edema. Neurological exam shows no focal neurological deficits. Skin exam shows no rashes, ecchymoses or  petechia.   Lab Results  Component Value Date   WBC 3.9 (L) 03/30/2016   HGB 17.8 (H) 03/30/2016   HCT 52.0 (H) 03/30/2016   MCV 86 03/30/2016   PLT 191 03/30/2016     Chemistry      Component Value Date/Time   NA 135 02/17/2016 1659   NA 134 01/18/2016 1514   NA 132 12/07/2015 1519   K 4.1 02/17/2016 1659   K 4.4 01/18/2016 1514   K 4.5 12/07/2015 1519   CL 99 02/17/2016 1659   CL 99 01/18/2016 1514   CL 101 12/07/2015 1519   CO2 21 02/17/2016 1659   CO2 27 01/18/2016 1514   CO2 24 12/07/2015 1519   BUN 18 02/17/2016 1659   BUN 19 01/18/2016 1514   BUN 28 (H) 12/07/2015  1519   CREATININE 1.60 (H) 02/17/2016 1659      Component Value Date/Time   CALCIUM 10.1 02/17/2016 1659   CALCIUM 9.4 01/18/2016 1514   CALCIUM 9.8 12/07/2015 1519   ALKPHOS 201 (H) 02/17/2016 1659   ALKPHOS 155 (H) 01/18/2016 1514   ALKPHOS 254 (H) 12/07/2015 1519   AST 17 02/17/2016 1659   AST 18 01/18/2016 1514   AST 32 12/07/2015 1519   ALT 27 02/17/2016 1659   ALT 21 01/18/2016 1514   ALT 64 (H) 12/07/2015 1519   BILITOT 1.0 02/17/2016 1659   BILITOT 0.7 01/18/2016 1514   BILITOT 1.50 12/07/2015 1519         Impression and Plan: Carlos [name] is a 55 year old African American male. He has erythrocytosis. This is clearly from the testosterone supplement. He definitely needs this.  I also worry about his diabetes. His blood sugars are under very poor control. I'm sure his hemoglobin A1c is quite high. I told him that if he develops hepatic steatosis, that this would worsen his erythrocytosis.  I think that we have to phlebotomize him 3 weeks in a row. I think this probably would be reasonable and can really get his blood count down. I just want to be aggressive and try to keep his blood count down so that he minimizes any risk of thromboembolic disease.  He understands. He will try to get his blood sugar under better control.  Of note, he also has cholesterol and triglyceride issues. This also is 9 going to help his erythrocytosis.   I will see him back in 6 weeks.  I spent about 40 minutes with him today.    Carlos Napoleon, MD 11/29/20175:11 PM

## 2016-03-30 NOTE — Patient Instructions (Signed)
Therapeutic Phlebotomy, Care After  Refer to this sheet in the next few weeks. These instructions provide you with information about caring for yourself after your procedure. Your health care provider may also give you more specific instructions. Your treatment has been planned according to current medical practices, but problems sometimes occur. Call your health care provider if you have any problems or questions after your procedure.  WHAT TO EXPECT AFTER THE PROCEDURE  After your procedure, it is common to have:   Light-headedness or dizziness. You may feel faint.   Nausea.   Tiredness.  HOME CARE INSTRUCTIONS  Activities   Return to your normal activities as directed by your health care provider. Most people can go back to their normal activities right away.   Avoid strenuous physical activity and heavy lifting or pulling for about 5 hours after the procedure. Do not lift anything that is heavier than 10 lb (4.5 kg).   Athletes should avoid strenuous exercise for at least 12 hours.   Change positions slowly for the remainder of the day. This will help to prevent light-headedness or fainting.   If you feel light-headed, lie down until the feeling goes away.  Eating and Drinking   Be sure to eat well-balanced meals for the next 24 hours.   Drink enough fluid to keep your urine clear or pale yellow.   Avoid drinking alcohol on the day that you had the procedure.  Care of the Needle Insertion Site   Keep your bandage dry. You can remove the bandage after about 5 hours or as directed by your health care provider.   If you have bleeding from the needle insertion site, elevate your arm and press firmly on the site until the bleeding stops.   If you have bruising at the site, apply ice to the area:   Put ice in a plastic bag.   Place a towel between your skin and the bag.   Leave the ice on for 20 minutes, 2-3 times a day for the first 24 hours.   If the swelling does not go away after 24 hours, apply  a warm, moist washcloth to the area for 20 minutes, 2-3 times a day.  General Instructions   Avoid smoking for at least 30 minutes after the procedure.   Keep all follow-up visits as directed by your health care provider. It is important to continue with further therapeutic phlebotomy treatments as directed.  SEEK MEDICAL CARE IF:   You have redness, swelling, or pain at the needle insertion site.   You have fluid, blood, or pus coming from the needle insertion site.   You feel light-headed, dizzy, or nauseated, and the feeling does not go away.   You notice new bruising at the needle insertion site.   You feel weaker than normal.   You have a fever or chills.  SEEK IMMEDIATE MEDICAL CARE IF:   You have severe nausea or vomiting.   You have chest pain.   You have trouble breathing.    This information is not intended to replace advice given to you by your health care provider. Make sure you discuss any questions you have with your health care provider.    Document Released: 09/20/2010 Document Revised: 09/02/2014 Document Reviewed: 04/14/2014  [employer] Interactive Patient Education 2016 [employer]

## 2016-03-30 NOTE — Progress Notes (Signed)
Carlos [name] presents today for phlebotomy per MD orders. Phlebotomy procedure started at 1532 and ended at 1543. 515 grams removed via 16 gauge needle to left AC. Patient observed for 30 minutes after procedure without any incident. Patient tolerated procedure well. IV needle removed intact.

## 2016-03-31 LAB — IRON AND TIBC
%SAT: 31 % (ref 20–55)
IRON: 90 ug/dL (ref 42–163)
TIBC: 288 ug/dL (ref 202–409)
UIBC: 198 ug/dL (ref 117–376)

## 2016-03-31 LAB — TESTOSTERONE: TESTOSTERONE: 295 ng/dL (ref 264–916)

## 2016-03-31 LAB — FERRITIN: Ferritin: 34 ng/ml (ref 22–316)

## 2016-04-06 ENCOUNTER — Other Ambulatory Visit: Payer: BLUE CROSS/BLUE SHIELD

## 2016-04-06 ENCOUNTER — Ambulatory Visit (HOSPITAL_BASED_OUTPATIENT_CLINIC_OR_DEPARTMENT_OTHER): Payer: BLUE CROSS/BLUE SHIELD

## 2016-04-06 ENCOUNTER — Other Ambulatory Visit: Payer: Self-pay | Admitting: Family

## 2016-04-06 NOTE — Patient Instructions (Signed)
Therapeutic Phlebotomy, Care After  Refer to this sheet in the next few weeks. These instructions provide you with information about caring for yourself after your procedure. Your health care provider may also give you more specific instructions. Your treatment has been planned according to current medical practices, but problems sometimes occur. Call your health care provider if you have any problems or questions after your procedure.  WHAT TO EXPECT AFTER THE PROCEDURE  After your procedure, it is common to have:   Light-headedness or dizziness. You may feel faint.   Nausea.   Tiredness.  HOME CARE INSTRUCTIONS  Activities   Return to your normal activities as directed by your health care provider. Most people can go back to their normal activities right away.   Avoid strenuous physical activity and heavy lifting or pulling for about 5 hours after the procedure. Do not lift anything that is heavier than 10 lb (4.5 kg).   Athletes should avoid strenuous exercise for at least 12 hours.   Change positions slowly for the remainder of the day. This will help to prevent light-headedness or fainting.   If you feel light-headed, lie down until the feeling goes away.  Eating and Drinking   Be sure to eat well-balanced meals for the next 24 hours.   Drink enough fluid to keep your urine clear or pale yellow.   Avoid drinking alcohol on the day that you had the procedure.  Care of the Needle Insertion Site   Keep your bandage dry. You can remove the bandage after about 5 hours or as directed by your health care provider.   If you have bleeding from the needle insertion site, elevate your arm and press firmly on the site until the bleeding stops.   If you have bruising at the site, apply ice to the area:   Put ice in a plastic bag.   Place a towel between your skin and the bag.   Leave the ice on for 20 minutes, 2-3 times a day for the first 24 hours.   If the swelling does not go away after 24 hours, apply  a warm, moist washcloth to the area for 20 minutes, 2-3 times a day.  General Instructions   Avoid smoking for at least 30 minutes after the procedure.   Keep all follow-up visits as directed by your health care provider. It is important to continue with further therapeutic phlebotomy treatments as directed.  SEEK MEDICAL CARE IF:   You have redness, swelling, or pain at the needle insertion site.   You have fluid, blood, or pus coming from the needle insertion site.   You feel light-headed, dizzy, or nauseated, and the feeling does not go away.   You notice new bruising at the needle insertion site.   You feel weaker than normal.   You have a fever or chills.  SEEK IMMEDIATE MEDICAL CARE IF:   You have severe nausea or vomiting.   You have chest pain.   You have trouble breathing.    This information is not intended to replace advice given to you by your health care provider. Make sure you discuss any questions you have with your health care provider.    Document Released: 09/20/2010 Document Revised: 09/02/2014 Document Reviewed: 04/14/2014  [employer] Interactive Patient Education 2016 [employer]

## 2016-04-06 NOTE — Progress Notes (Signed)
Carlos [name] presents today for phlebotomy per MD orders. Phlebotomy procedure started at 1441 and ended at 1451. 520 grams removed via 16 gauge PIV to Right AC. Patient observed for 15 minutes after procedure without any incident. Pt did not want to stay for full 30 minutes post procedure. Pt verbalized understanding of d/c instructions and had no further questions. Pt given post procedure snacks. Patient tolerated procedure well. IV needle removed intact.

## 2016-04-13 ENCOUNTER — Ambulatory Visit (HOSPITAL_BASED_OUTPATIENT_CLINIC_OR_DEPARTMENT_OTHER): Payer: BLUE CROSS/BLUE SHIELD

## 2016-04-13 ENCOUNTER — Other Ambulatory Visit: Payer: BLUE CROSS/BLUE SHIELD

## 2016-04-13 NOTE — Patient Instructions (Signed)
Therapeutic Phlebotomy, Care After  Refer to this sheet in the next few weeks. These instructions provide you with information about caring for yourself after your procedure. Your health care provider may also give you more specific instructions. Your treatment has been planned according to current medical practices, but problems sometimes occur. Call your health care provider if you have any problems or questions after your procedure.  WHAT TO EXPECT AFTER THE PROCEDURE  After your procedure, it is common to have:   Light-headedness or dizziness. You may feel faint.   Nausea.   Tiredness.  HOME CARE INSTRUCTIONS  Activities   Return to your normal activities as directed by your health care provider. Most people can go back to their normal activities right away.   Avoid strenuous physical activity and heavy lifting or pulling for about 5 hours after the procedure. Do not lift anything that is heavier than 10 lb (4.5 kg).   Athletes should avoid strenuous exercise for at least 12 hours.   Change positions slowly for the remainder of the day. This will help to prevent light-headedness or fainting.   If you feel light-headed, lie down until the feeling goes away.  Eating and Drinking   Be sure to eat well-balanced meals for the next 24 hours.   Drink enough fluid to keep your urine clear or pale yellow.   Avoid drinking alcohol on the day that you had the procedure.  Care of the Needle Insertion Site   Keep your bandage dry. You can remove the bandage after about 5 hours or as directed by your health care provider.   If you have bleeding from the needle insertion site, elevate your arm and press firmly on the site until the bleeding stops.   If you have bruising at the site, apply ice to the area:   Put ice in a plastic bag.   Place a towel between your skin and the bag.   Leave the ice on for 20 minutes, 2-3 times a day for the first 24 hours.   If the swelling does not go away after 24 hours, apply  a warm, moist washcloth to the area for 20 minutes, 2-3 times a day.  General Instructions   Avoid smoking for at least 30 minutes after the procedure.   Keep all follow-up visits as directed by your health care provider. It is important to continue with further therapeutic phlebotomy treatments as directed.  SEEK MEDICAL CARE IF:   You have redness, swelling, or pain at the needle insertion site.   You have fluid, blood, or pus coming from the needle insertion site.   You feel light-headed, dizzy, or nauseated, and the feeling does not go away.   You notice new bruising at the needle insertion site.   You feel weaker than normal.   You have a fever or chills.  SEEK IMMEDIATE MEDICAL CARE IF:   You have severe nausea or vomiting.   You have chest pain.   You have trouble breathing.    This information is not intended to replace advice given to you by your health care provider. Make sure you discuss any questions you have with your health care provider.    Document Released: 09/20/2010 Document Revised: 09/02/2014 Document Reviewed: 04/14/2014  [employer] Interactive Patient Education 2016 [employer]

## 2016-04-13 NOTE — Progress Notes (Signed)
Carlos [name] presents today for phlebotomy per MD orders. Phlebotomy procedure started at 1454 and ended at 38. 528 grams removed via 16 gauge to left AC.  Patient observed for 30 minutes after procedure without any incident. Patient tolerated procedure well. IV needle removed intact.

## 2016-04-20 ENCOUNTER — Other Ambulatory Visit (HOSPITAL_BASED_OUTPATIENT_CLINIC_OR_DEPARTMENT_OTHER): Payer: BLUE CROSS/BLUE SHIELD

## 2016-04-20 ENCOUNTER — Ambulatory Visit (HOSPITAL_BASED_OUTPATIENT_CLINIC_OR_DEPARTMENT_OTHER): Payer: BLUE CROSS/BLUE SHIELD

## 2016-04-20 DIAGNOSIS — E1121 Type 2 diabetes mellitus with diabetic nephropathy: Secondary | ICD-10-CM

## 2016-04-20 DIAGNOSIS — E349 Endocrine disorder, unspecified: Secondary | ICD-10-CM

## 2016-04-20 LAB — CBC WITH DIFFERENTIAL (CANCER CENTER ONLY)
BASO#: 0 10*3/uL (ref 0.0–0.2)
BASO%: 0.7 % (ref 0.0–2.0)
EOS ABS: 0.2 10*3/uL (ref 0.0–0.5)
EOS%: 3.5 % (ref 0.0–7.0)
HEMATOCRIT: 48.5 % (ref 38.7–49.9)
HGB: 16.4 g/dL (ref 13.0–17.1)
LYMPH#: 2 10*3/uL (ref 0.9–3.3)
LYMPH%: 35.5 % (ref 14.0–48.0)
MCH: 28.8 pg (ref 28.0–33.4)
MCHC: 33.8 g/dL (ref 32.0–35.9)
MCV: 85 fL (ref 82–98)
MONO#: 0.3 10*3/uL (ref 0.1–0.9)
MONO%: 6.2 % (ref 0.0–13.0)
NEUT#: 3 10*3/uL (ref 1.5–6.5)
NEUT%: 54.1 % (ref 40.0–80.0)
Platelets: 210 10*3/uL (ref 145–400)
RBC: 5.69 10*6/uL (ref 4.20–5.70)
RDW: 12.4 % (ref 11.1–15.7)
WBC: 5.5 10*3/uL (ref 4.0–10.0)

## 2016-04-20 NOTE — Patient Instructions (Signed)
Therapeutic Phlebotomy, Care After  Refer to this sheet in the next few weeks. These instructions provide you with information about caring for yourself after your procedure. Your health care provider may also give you more specific instructions. Your treatment has been planned according to current medical practices, but problems sometimes occur. Call your health care provider if you have any problems or questions after your procedure.  WHAT TO EXPECT AFTER THE PROCEDURE  After your procedure, it is common to have:   Light-headedness or dizziness. You may feel faint.   Nausea.   Tiredness.  HOME CARE INSTRUCTIONS  Activities   Return to your normal activities as directed by your health care provider. Most people can go back to their normal activities right away.   Avoid strenuous physical activity and heavy lifting or pulling for about 5 hours after the procedure. Do not lift anything that is heavier than 10 lb (4.5 kg).   Athletes should avoid strenuous exercise for at least 12 hours.   Change positions slowly for the remainder of the day. This will help to prevent light-headedness or fainting.   If you feel light-headed, lie down until the feeling goes away.  Eating and Drinking   Be sure to eat well-balanced meals for the next 24 hours.   Drink enough fluid to keep your urine clear or pale yellow.   Avoid drinking alcohol on the day that you had the procedure.  Care of the Needle Insertion Site   Keep your bandage dry. You can remove the bandage after about 5 hours or as directed by your health care provider.   If you have bleeding from the needle insertion site, elevate your arm and press firmly on the site until the bleeding stops.   If you have bruising at the site, apply ice to the area:   Put ice in a plastic bag.   Place a towel between your skin and the bag.   Leave the ice on for 20 minutes, 2-3 times a day for the first 24 hours.   If the swelling does not go away after 24 hours, apply  a warm, moist washcloth to the area for 20 minutes, 2-3 times a day.  General Instructions   Avoid smoking for at least 30 minutes after the procedure.   Keep all follow-up visits as directed by your health care provider. It is important to continue with further therapeutic phlebotomy treatments as directed.  SEEK MEDICAL CARE IF:   You have redness, swelling, or pain at the needle insertion site.   You have fluid, blood, or pus coming from the needle insertion site.   You feel light-headed, dizzy, or nauseated, and the feeling does not go away.   You notice new bruising at the needle insertion site.   You feel weaker than normal.   You have a fever or chills.  SEEK IMMEDIATE MEDICAL CARE IF:   You have severe nausea or vomiting.   You have chest pain.   You have trouble breathing.    This information is not intended to replace advice given to you by your health care provider. Make sure you discuss any questions you have with your health care provider.    Document Released: 09/20/2010 Document Revised: 09/02/2014 Document Reviewed: 04/14/2014  [employer] Interactive Patient Education 2016 [employer]

## 2016-04-20 NOTE — Progress Notes (Signed)
Carlos [name] presents today for phlebotomy per MD orders. Phlebotomy procedure started at 1519 and ended at 1529 via 16 gauge PIV to R AC.  515 grams removed. Patient observed for 30 minutes after procedure without any incident. Patient tolerated procedure well. IV needle removed intact.

## 2016-05-10 ENCOUNTER — Other Ambulatory Visit: Payer: Self-pay | Admitting: Internal Medicine

## 2016-05-10 NOTE — Telephone Encounter (Signed)
P[lease call Depo Test 

## 2016-05-12 ENCOUNTER — Other Ambulatory Visit (HOSPITAL_BASED_OUTPATIENT_CLINIC_OR_DEPARTMENT_OTHER): Payer: BLUE CROSS/BLUE SHIELD

## 2016-05-12 ENCOUNTER — Ambulatory Visit (HOSPITAL_BASED_OUTPATIENT_CLINIC_OR_DEPARTMENT_OTHER): Payer: BLUE CROSS/BLUE SHIELD

## 2016-05-12 ENCOUNTER — Ambulatory Visit (HOSPITAL_BASED_OUTPATIENT_CLINIC_OR_DEPARTMENT_OTHER): Payer: BLUE CROSS/BLUE SHIELD | Admitting: Hematology & Oncology

## 2016-05-12 VITALS — BP 116/60 | HR 83 | Temp 97.5°F | Resp 16

## 2016-05-12 DIAGNOSIS — E119 Type 2 diabetes mellitus without complications: Secondary | ICD-10-CM

## 2016-05-12 DIAGNOSIS — R700 Elevated erythrocyte sedimentation rate: Secondary | ICD-10-CM | POA: Diagnosis not present

## 2016-05-12 DIAGNOSIS — E349 Endocrine disorder, unspecified: Secondary | ICD-10-CM

## 2016-05-12 DIAGNOSIS — E1121 Type 2 diabetes mellitus with diabetic nephropathy: Secondary | ICD-10-CM

## 2016-05-12 LAB — CBC WITH DIFFERENTIAL (CANCER CENTER ONLY)
BASO#: 0.1 10*3/uL (ref 0.0–0.2)
BASO%: 1 % (ref 0.0–2.0)
EOS%: 2.4 % (ref 0.0–7.0)
Eosinophils Absolute: 0.1 10*3/uL (ref 0.0–0.5)
HCT: 50.1 % — ABNORMAL HIGH (ref 38.7–49.9)
HEMOGLOBIN: 16.9 g/dL (ref 13.0–17.1)
LYMPH#: 2 10*3/uL (ref 0.9–3.3)
LYMPH%: 39.5 % (ref 14.0–48.0)
MCH: 28.5 pg (ref 28.0–33.4)
MCHC: 33.7 g/dL (ref 32.0–35.9)
MCV: 85 fL (ref 82–98)
MONO#: 0.4 10*3/uL (ref 0.1–0.9)
MONO%: 7.3 % (ref 0.0–13.0)
NEUT%: 49.8 % (ref 40.0–80.0)
NEUTROS ABS: 2.5 10*3/uL (ref 1.5–6.5)
PLATELETS: 222 10*3/uL (ref 145–400)
RBC: 5.93 10*6/uL — AB (ref 4.20–5.70)
RDW: 12.9 % (ref 11.1–15.7)
WBC: 4.9 10*3/uL (ref 4.0–10.0)

## 2016-05-12 LAB — CMP (CANCER CENTER ONLY)
ALBUMIN: 3.9 g/dL (ref 3.3–5.5)
ALT(SGPT): 27 U/L (ref 10–47)
AST: 18 U/L (ref 11–38)
Alkaline Phosphatase: 133 U/L — ABNORMAL HIGH (ref 26–84)
BILIRUBIN TOTAL: 1.2 mg/dL (ref 0.20–1.60)
BUN, Bld: 24 mg/dL — ABNORMAL HIGH (ref 7–22)
CO2: 28 meq/L (ref 18–33)
CREATININE: 1.5 mg/dL — AB (ref 0.6–1.2)
Calcium: 9.8 mg/dL (ref 8.0–10.3)
Chloride: 104 mEq/L (ref 98–108)
Glucose, Bld: 200 mg/dL — ABNORMAL HIGH (ref 73–118)
Potassium: 4.1 mEq/L (ref 3.3–4.7)
SODIUM: 142 meq/L (ref 128–145)
TOTAL PROTEIN: 7 g/dL (ref 6.4–8.1)

## 2016-05-12 NOTE — Progress Notes (Signed)
Carlos [name] presents today for phlebotomy per MD orders. Phlebotomy procedure started at 1520 and ended at 1530. 500 grams removed. Patient observed for 30 minutes after procedure without any incident. Patient tolerated procedure well. IV needle removed intact.

## 2016-05-12 NOTE — Patient Instructions (Signed)
     Therapeutic Phlebotomy, Care After Refer to this sheet in the next few weeks. These instructions provide you with information about caring for yourself after your procedure. Your health care provider may also give you more specific instructions. Your treatment has been planned according to current medical practices, but problems sometimes occur. Call your health care provider if you have any problems or questions after your procedure. What can I expect after the procedure? After the procedure, it is common to have:  Light-headedness or dizziness. You may feel faint.  Nausea.  Tiredness. Follow these instructions at home: Activity  Return to your normal activities as directed by your health care provider. Most people can go back to their normal activities right away.  Avoid strenuous physical activity and heavy lifting or pulling for about 5 hours after the procedure. Do not lift anything that is heavier than 10 lb (4.5 kg).  Athletes should avoid strenuous exercise for at least 12 hours.  Change positions slowly for the remainder of the day. This will help to prevent light-headedness or fainting.  If you feel light-headed, lie down until the feeling goes away. Eating and drinking  Be sure to eat well-balanced meals for the next 24 hours.  Drink enough fluid to keep your urine clear or pale yellow.  Avoid drinking alcohol on the day that you had the procedure. Care of the Needle Insertion Site  Keep your bandage dry. You can remove the bandage after about 5 hours or as directed by your health care provider.  If you have bleeding from the needle insertion site, elevate your arm and press firmly on the site until the bleeding stops.  If you have bruising at the site, apply ice to the area:  Put ice in a plastic bag.  Place a towel between your skin and the bag.  Leave the ice on for 20 minutes, 2-3 times a day for the first 24 hours.  If the swelling does not go away  after 24 hours, apply a warm, moist washcloth to the area for 20 minutes, 2-3 times a day. General instructions  Avoid smoking for at least 30 minutes after the procedure.  Keep all follow-up visits as directed by your health care provider. It is important to continue with further therapeutic phlebotomy treatments as directed. Contact a health care provider if:  You have redness, swelling, or pain at the needle insertion site.  You have fluid, blood, or pus coming from the needle insertion site.  You feel light-headed, dizzy, or nauseated, and the feeling does not go away.  You notice new bruising at the needle insertion site.  You feel weaker than normal.  You have a fever or chills. Get help right away if:  You have severe nausea or vomiting.  You have chest pain.  You have trouble breathing. This information is not intended to replace advice given to you by your health care provider. Make sure you discuss any questions you have with your health care provider. Document Released: 09/20/2010 Document Revised: 12/19/2015 Document Reviewed: 04/14/2014 Elsevier Interactive Patient Education  2017 [employer]  

## 2016-05-13 LAB — IRON AND TIBC
%SAT: 44 % (ref 20–55)
IRON: 133 ug/dL (ref 42–163)
TIBC: 306 ug/dL (ref 202–409)
UIBC: 173 ug/dL (ref 117–376)

## 2016-05-13 LAB — FERRITIN: Ferritin: 25 ng/ml (ref 22–316)

## 2016-05-13 LAB — TESTOSTERONE: TESTOSTERONE: 92 ng/dL — AB (ref 264–916)

## 2016-05-13 NOTE — Progress Notes (Signed)
Hematology and Oncology Follow Up Visit  Carlos [name] [id number] 1960/10/11 56 y.o. 05/13/2016   Principle Diagnosis:   Hemochromatosis - Heterozygous for H63D mutation  Erythrocytosis due to testosterone supplementation  Current Therapy:    Phlebotomies to keep % saturation <30%  Phlebotomy to keep Hct < 45%  EC ASA 81 mg po q day     Interim History:  Carlos [name] is back for a follow-up. We last saw him back in late October. He says he is not had any testosterone since.  I think his big problem are the blood sugars that are under very poor control. I told that his family doctor really needs to help out with this. I told him that if he were to develop a fatty liver from point controlled diabetes, I would be a much bigger problem than erythrocytosis.   I really don't think that hemochromatosis is an issue. He only has a minor mutation that he is heterozygote for. Back in December, as ferritin was 34 with an iron saturation of 31%.   He is not taking supplemental testosterone. His last testosterone level was quite low at 92.  He has had no problems with headache. He's had no cough or shortness of breath. He's had no joint problems.   He is working. He's pretty busy at work.   He had a good Christmas and New Year's. He watched a lot of football. 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 He loves the [facility] and is getting ready for the playoffs.   Overall, his performance status is [name].  Medications:  Current Outpatient Prescriptions:  .  aspirin EC 81 MG tablet, Take 81 mg by mouth daily., Disp: , Rfl:  .  Cholecalciferol (VITAMIN D-3) 5000 UNITS TABS, Take 5,000 Units by mouth daily., Disp: , Rfl:  .  empagliflozin (JARDIANCE) 25 MG TABS tablet, Take 25 mg by mouth daily., Disp: 90 tablet, Rfl: 1 .  Liraglutide (VICTOZA) 18 MG/3ML SOPN, INJECT 1.8 MGS DAILY AS DIRECTED, Disp: 9 pen, Rfl: 2 .  lisinopril (PRINIVIL,ZESTRIL) 20 MG tablet, Take 20 mg by mouth daily., Disp: , Rfl:  0 .  metFORMIN (GLUCOPHAGE-XR) 500 MG 24 hr tablet, TAKE 4 TABLETS (2,000 MG TOTAL) BY MOUTH DAILY., Disp: 120 tablet, Rfl: 0 .  testosterone cypionate (DEPOTESTOSTERONE CYPIONATE) 200 MG/ML injection, INJECT 1.5 ML EVERY 2 WEEKS., Disp: 10 mL, Rfl: 0 .  VIAGRA 100 MG tablet, TAKE 1/2 TO 1 TABLET ONCE DAILY AS NEEDED, Disp: 6 tablet, Rfl: 11  Allergies: No Known Allergies  Past Medical History, Surgical history, Social history, and Family History were reviewed and updated.  Review of Systems: As above  Physical Exam:  oral temperature is 97.5 F (36.4 C). His blood pressure is 116/60 and his pulse is 83. His respiration is 16 and oxygen saturation is 98%.   Wt Readings from Last 3 Encounters:  03/30/16 283 lb (128.4 kg)  02/17/16 282 lb (127.9 kg)  01/18/16 287 lb (130.2 kg)     Well-developed and well-nourished African [place] male in no obvious distress. Head and neck exam shows no ocular or oral lesions. There are no palpable cervical or supraclavicular lymph nodes. He has no scleral icterus. His lungs are clear bilaterally. Cardiac exam regular rate and rhythm with no murmurs, rubs or bruits. Abdomen is soft. He has good bowel sounds. There is no fluid wave. There is no palpable liver or spleen tip. Back exam shows no tenderness over the spine, ribs or hips.  Extremity shows no clubbing, cyanosis or edema. Neurological exam shows no focal neurological deficits. Skin exam shows no rashes, ecchymoses or petechia.   Lab Results  Component Value Date   WBC 4.9 05/12/2016   HGB 16.9 05/12/2016   HCT 50.1 (H) 05/12/2016   MCV 85 05/12/2016   PLT 222 05/12/2016     Chemistry      Component Value Date/Time   NA 142 05/12/2016 1418   K 4.1 05/12/2016 1418   CL 104 05/12/2016 1418   CO2 28 05/12/2016 1418   BUN 24 (H) 05/12/2016 1418   CREATININE 1.5 (H) 05/12/2016 1418      Component Value Date/Time   CALCIUM 9.8 05/12/2016 1418   ALKPHOS 133 (H) 05/12/2016 1418   AST 18  05/12/2016 1418   ALT 27 05/12/2016 1418   BILITOT 1.20 05/12/2016 1418         Impression and Plan: Carlos [name] is a 56 year old African American male. He has erythrocytosis. This is clearly from the testosterone supplement. He definitely needs this.  I also worry about his diabetes. His blood sugars are under very poor control. I'm sure his hemoglobin A1c is quite high. I told him that if he develops hepatic steatosis, that this would worsen his erythrocytosis.  I think that we have to phlebotomize him today. I think this probably would be reasonable and can really get his blood count down. I just want to be aggressive and try to keep his blood count down so that he minimizes any risk of thromboembolic disease.  He understands. He will try to get his blood sugar under better control.  Of note, he also has cholesterol and triglyceride issues. This also is not going to help his erythrocytosis.   I will see him back in 4 weeks  I spent about 40 minutes with him today.    Volanda Napoleon, MD 1/12/20187:15 AM

## 2016-05-30 ENCOUNTER — Ambulatory Visit (INDEPENDENT_AMBULATORY_CARE_PROVIDER_SITE_OTHER): Payer: BLUE CROSS/BLUE SHIELD | Admitting: Internal Medicine

## 2016-05-30 ENCOUNTER — Encounter: Payer: Self-pay | Admitting: Internal Medicine

## 2016-05-30 VITALS — BP 110/62 | HR 70 | Temp 98.4°F | Resp 16 | Ht 73.0 in | Wt 287.0 lb

## 2016-05-30 DIAGNOSIS — E349 Endocrine disorder, unspecified: Secondary | ICD-10-CM

## 2016-05-30 DIAGNOSIS — Z79899 Other long term (current) drug therapy: Secondary | ICD-10-CM | POA: Diagnosis not present

## 2016-05-30 DIAGNOSIS — E782 Mixed hyperlipidemia: Secondary | ICD-10-CM

## 2016-05-30 DIAGNOSIS — E1165 Type 2 diabetes mellitus with hyperglycemia: Secondary | ICD-10-CM | POA: Diagnosis not present

## 2016-05-30 NOTE — Progress Notes (Signed)
Assessment and Plan:  Hypertension:  -well controlled -Continue medication -monitor blood pressure at home. -Continue DASH diet -Reminder to go to the ER if any CP, SOB, nausea, dizziness, severe HA, changes vision/speech, left arm numbness and tingling and jaw pain.  Cholesterol - Continue diet and exercise -Check cholesterol. -likely need medication  Diabetes with diabetic chronic kidney disease  -very poorly controlled -discussed diet and recommended small changes to help decrease the weight -needs insulin therapy.  Is maxed out on oral medications and also on a GLP-1.  Have discussed soliqua with him but may do better with 70/30 mix and stop all oral medciations.  -Continue diet and exercise.  -Check A1C  Vitamin D Def -continue medications.   Hemochromatosis -follows with Dr. Marin Olp -recently had phlebotomy  Testosterone Def -is still doing injections -recently checked -likely source as to why he has some increase in hematocrit   Continue diet and meds as discussed. Further disposition pending results of labs. Discussed med's effects and SE's.    HPI 56 y.o. male  presents for 3 month follow up with hypertension, hyperlipidemia, diabetes and vitamin D deficiency.   His blood pressure has been controlled at home, today their BP is BP: 110/62.He does workout. He denies chest pain, shortness of breath, dizziness.   He is on cholesterol medication and denies myalgias. His cholesterol is at goal. The cholesterol was:  02/17/2016: Cholesterol 179; HDL 36; LDL Cholesterol NOT CALC; Triglycerides 606   He has been working on diet and exercise for diabetes with diabetic chronic kidney disease, he is on bASA, he is on ACE/ARB, and denies  foot ulcerations, hyperglycemia, hypoglycemia , increased appetite, nausea, paresthesia of the feet, polydipsia, polyuria, visual disturbances, vomiting and weight loss. Last A1C was: 02/17/2016: Hgb A1c MFr Bld 12.2.  He reports that his blood  sugars are still running consistently over 200.  He has not seen much change.  He is taking all his medications.  He did work a little bit on his diet.    He is still following with Dr. Marin Olp for his hemachromatosis.  He did recently have some phlebotomy.  They still believe a large portion of this is due to testosterone use.    He is not exercising currently.  He is gaining weight.  He reports that a lot of this is due to the fact that he is now very sedentary at work.     Patient is on Vitamin D supplement. 08/05/2015: Vit D, 25-Hydroxy 43    Current Medications:  Current Outpatient Prescriptions on File Prior to Visit  Medication Sig Dispense Refill  . aspirin EC 81 MG tablet Take 81 mg by mouth daily.    . Cholecalciferol (VITAMIN D-3) 5000 UNITS TABS Take 5,000 Units by mouth daily.    . empagliflozin (JARDIANCE) 25 MG TABS tablet Take 25 mg by mouth daily. 90 tablet 1  . Liraglutide (VICTOZA) 18 MG/3ML SOPN INJECT 1.8 MGS DAILY AS DIRECTED 9 pen 2  . lisinopril (PRINIVIL,ZESTRIL) 20 MG tablet Take 20 mg by mouth daily.  0  . metFORMIN (GLUCOPHAGE-XR) 500 MG 24 hr tablet TAKE 4 TABLETS (2,000 MG TOTAL) BY MOUTH DAILY. 120 tablet 0  . testosterone cypionate (DEPOTESTOSTERONE CYPIONATE) 200 MG/ML injection INJECT 1.5 ML EVERY 2 WEEKS. 10 mL 0  . VIAGRA 100 MG tablet TAKE 1/2 TO 1 TABLET ONCE DAILY AS NEEDED 6 tablet 11   No current facility-administered medications on file prior to visit.    Medical History:  Past Medical History:  Diagnosis Date  . History of kidney stones   . Hypogonadism male   . Left ureteral stone   . OSA on CPAP   . Right bundle branch block   . Type 2 diabetes mellitus (HCC)    Allergies: No Known Allergies   Review of Systems:  Review of Systems  Constitutional: Negative for chills, fever and malaise/fatigue.  HENT: Negative for congestion, ear pain and sore throat.   Eyes: Negative.   Respiratory: Negative for cough, shortness of breath and  wheezing.   Cardiovascular: Negative for chest pain, palpitations and leg swelling.  Gastrointestinal: Negative for abdominal pain, blood in stool, constipation, diarrhea, heartburn and melena.  Genitourinary: Negative.   Skin: Negative.   Neurological: Negative for dizziness, sensory change, loss of consciousness and headaches.  Psychiatric/Behavioral: Negative for depression. The patient is not nervous/anxious and does not have insomnia.     Family history- Review and unchanged  Social history- Review and unchanged  Physical Exam: BP 110/62   Pulse 70   Temp 98.4 F (36.9 C) (Temporal)   Resp 16   Ht 6\' 1"  (1.854 m)   Wt 287 lb (130.2 kg)   BMI 37.87 kg/m  Wt Readings from Last 3 Encounters:  05/30/16 287 lb (130.2 kg)  03/30/16 283 lb (128.4 kg)  02/17/16 282 lb (127.9 kg)   General Appearance: Well nourished well developed, non-toxic appearing, in no apparent distress. Eyes: PERRLA, EOMs, conjunctiva no swelling or erythema ENT/Mouth: Ear canals clear with no erythema, swelling, or discharge.  TMs normal bilaterally, oropharynx clear, moist, with no exudate.   Neck: Supple, thyroid normal, no JVD, no cervical adenopathy.  Respiratory: Respiratory effort normal, breath sounds clear A&P, no wheeze, rhonchi or rales noted.  No retractions, no accessory muscle usage Cardio: RRR with no MRGs. No noted edema.  Abdomen: Soft, + BS.  Non tender, no guarding, rebound, hernias, masses. Musculoskeletal: Full ROM, 5/5 strength, Normal gait Skin: Warm, dry without rashes, lesions, ecchymosis.  Neuro: Awake and oriented X 3, Cranial nerves intact. No cerebellar symptoms.  Psych: normal affect, Insight and Judgment appropriate.    Starlyn Skeans, PA-C 4:34 PM [facility]

## 2016-05-31 LAB — LIPID PANEL
CHOLESTEROL: 153 mg/dL (ref <200)
HDL: 50 mg/dL (ref >40)
LDL Cholesterol: 46 mg/dL (ref <100)
Total CHOL/HDL Ratio: 3.1 Ratio (ref <5.0)
Triglycerides: 283 mg/dL — ABNORMAL HIGH (ref <150)
VLDL: 57 mg/dL — ABNORMAL HIGH (ref <30)

## 2016-05-31 LAB — HEPATIC FUNCTION PANEL
ALBUMIN: 4 g/dL (ref 3.6–5.1)
ALT: 17 U/L (ref 9–46)
AST: 14 U/L (ref 10–35)
Alkaline Phosphatase: 115 U/L (ref 40–115)
Bilirubin, Direct: 0.2 mg/dL (ref <=0.2)
Indirect Bilirubin: 0.9 mg/dL (ref 0.2–1.2)
TOTAL PROTEIN: 6.9 g/dL (ref 6.1–8.1)
Total Bilirubin: 1.1 mg/dL (ref 0.2–1.2)

## 2016-05-31 LAB — BASIC METABOLIC PANEL WITH GFR
BUN: 19 mg/dL (ref 7–25)
CO2: 25 mmol/L (ref 20–31)
CREATININE: 1.62 mg/dL — AB (ref 0.70–1.33)
Calcium: 9.7 mg/dL (ref 8.6–10.3)
Chloride: 104 mmol/L (ref 98–110)
GFR, EST NON AFRICAN AMERICAN: 48 mL/min — AB (ref >=60)
GFR, Est African American: 55 mL/min — ABNORMAL LOW (ref >=60)
GLUCOSE: 206 mg/dL — AB (ref 65–99)
POTASSIUM: 4.2 mmol/L (ref 3.5–5.3)
SODIUM: 139 mmol/L (ref 135–146)

## 2016-05-31 LAB — HEMOGLOBIN A1C
Hgb A1c MFr Bld: 11.1 % — ABNORMAL HIGH (ref <5.7)
MEAN PLASMA GLUCOSE: 272 mg/dL

## 2016-05-31 LAB — CBC WITH DIFFERENTIAL/PLATELET
BASOS PCT: 1 %
Basophils Absolute: 53 cells/uL (ref 0–200)
Eosinophils Absolute: 159 cells/uL (ref 15–500)
Eosinophils Relative: 3 %
HCT: 48.1 % (ref 38.5–50.0)
Hemoglobin: 15.5 g/dL (ref 13.2–17.1)
LYMPHS PCT: 42 %
Lymphs Abs: 2226 cells/uL (ref 850–3900)
MCH: 27.5 pg (ref 27.0–33.0)
MCHC: 32.2 g/dL (ref 32.0–36.0)
MCV: 85.3 fL (ref 80.0–100.0)
MONO ABS: 318 {cells}/uL (ref 200–950)
MPV: 11.4 fL (ref 7.5–12.5)
Monocytes Relative: 6 %
NEUTROS PCT: 48 %
Neutro Abs: 2544 cells/uL (ref 1500–7800)
Platelets: 265 10*3/uL (ref 140–400)
RBC: 5.64 MIL/uL (ref 4.20–5.80)
RDW: 14.2 % (ref 11.0–15.0)
WBC: 5.3 10*3/uL (ref 3.8–10.8)

## 2016-06-13 ENCOUNTER — Other Ambulatory Visit (HOSPITAL_BASED_OUTPATIENT_CLINIC_OR_DEPARTMENT_OTHER): Payer: BLUE CROSS/BLUE SHIELD

## 2016-06-13 ENCOUNTER — Ambulatory Visit (HOSPITAL_BASED_OUTPATIENT_CLINIC_OR_DEPARTMENT_OTHER): Payer: BLUE CROSS/BLUE SHIELD

## 2016-06-13 ENCOUNTER — Ambulatory Visit (HOSPITAL_BASED_OUTPATIENT_CLINIC_OR_DEPARTMENT_OTHER): Payer: BLUE CROSS/BLUE SHIELD | Admitting: Family

## 2016-06-13 VITALS — BP 100/70 | HR 72 | Temp 98.6°F | Wt 286.1 lb

## 2016-06-13 DIAGNOSIS — E349 Endocrine disorder, unspecified: Secondary | ICD-10-CM

## 2016-06-13 LAB — CBC WITH DIFFERENTIAL (CANCER CENTER ONLY)
BASO#: 0 10*3/uL (ref 0.0–0.2)
BASO%: 0.3 % (ref 0.0–2.0)
EOS%: 3 % (ref 0.0–7.0)
Eosinophils Absolute: 0.2 10*3/uL (ref 0.0–0.5)
HEMATOCRIT: 50.8 % — AB (ref 38.7–49.9)
HEMOGLOBIN: 17.1 g/dL (ref 13.0–17.1)
LYMPH#: 2.2 10*3/uL (ref 0.9–3.3)
LYMPH%: 38.9 % (ref 14.0–48.0)
MCH: 28.1 pg (ref 28.0–33.4)
MCHC: 33.7 g/dL (ref 32.0–35.9)
MCV: 84 fL (ref 82–98)
MONO#: 0.3 10*3/uL (ref 0.1–0.9)
MONO%: 5.6 % (ref 0.0–13.0)
NEUT#: 3 10*3/uL (ref 1.5–6.5)
NEUT%: 52.2 % (ref 40.0–80.0)
Platelets: 236 10*3/uL (ref 145–400)
RBC: 6.08 10*6/uL — AB (ref 4.20–5.70)
RDW: 13.9 % (ref 11.1–15.7)
WBC: 5.8 10*3/uL (ref 4.0–10.0)

## 2016-06-13 LAB — COMPREHENSIVE METABOLIC PANEL (CC13)
ALBUMIN: 4.4 g/dL (ref 3.5–5.5)
ALT: 24 IU/L (ref 0–44)
AST (SGOT): 16 IU/L (ref 0–40)
Albumin/Globulin Ratio: 1.5 (ref 1.2–2.2)
Alkaline Phosphatase, S: 175 IU/L — ABNORMAL HIGH (ref 39–117)
BUN/Creatinine Ratio: 11 (ref 9–20)
BUN: 19 mg/dL (ref 6–24)
Bilirubin Total: 1 mg/dL (ref 0.0–1.2)
CALCIUM: 10 mg/dL (ref 8.7–10.2)
Carbon Dioxide, Total: 28 mmol/L (ref 18–29)
Chloride, Ser: 100 mmol/L (ref 96–106)
Creatinine, Ser: 1.73 mg/dL — ABNORMAL HIGH (ref 0.76–1.27)
GFR calc non Af Amer: 44 mL/min/{1.73_m2} — ABNORMAL LOW (ref >59)
GFR, EST AFRICAN AMERICAN: 51 mL/min/{1.73_m2} — AB (ref >59)
GLOBULIN, TOTAL: 2.9 g/dL (ref 1.5–4.5)
Glucose: 236 mg/dL — ABNORMAL HIGH (ref 65–99)
Potassium, Ser: 4.2 mmol/L (ref 3.5–5.2)
SODIUM: 135 mmol/L (ref 134–144)
TOTAL PROTEIN: 7.3 g/dL (ref 6.0–8.5)

## 2016-06-13 NOTE — Progress Notes (Signed)
Carlos [name] presents today for phlebotomy per MD orders. Phlebotomy procedure started at 1448 and ended at 1457. 520 grams removed, via 16G in L AC.  Pt declined to stay for the 30 min observation. Tolerated procedure well and diet and nutrition offered.

## 2016-06-13 NOTE — Progress Notes (Signed)
Hematology and Oncology Follow Up Visit  Carlos [name] [id number] January 21, 1961 56 y.o. 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 His Hct is still staying at 50.8. He is asymptomatic with this. He continues to take his baby aspirin daily.  He has restarted his occasional monthly testosterone injections which is likely what is driving the elevated Hct. He states that he does not receive the testosterone injection every month. His last testosterone level was 92 and he was symptomatic with fatigue. His BP and HR today are stable.   No fever, chills, n/v, cough, rash, dizziness, SOB, chest pain, palpitations, abdominal pain or changes in bowel or bladder habits.  No tenderness, numbness or tingling in his extremities. He has chronic puffiness in his left ankle from an old war injury.  He has maintained a good appetite and is staying well hydrated. His weight is stable.  His blood sugars are not well controlled.   Medications:  Allergies as of 06/13/2016   No Known Allergies     Medication List       Accurate as of 06/13/16  2:49 PM. Always use your most recent med list.          aspirin EC 81 MG tablet Take 81 mg by mouth daily.   empagliflozin 25 MG Tabs tablet Commonly known as:  JARDIANCE Take 25 mg by mouth daily.   liraglutide 18 MG/3ML Sopn Commonly known as:  VICTOZA INJECT 1.8 MGS DAILY AS DIRECTED   lisinopril 20 MG tablet Commonly known as:  PRINIVIL,ZESTRIL Take 20 mg by mouth daily.   metFORMIN 500 MG 24 hr tablet Commonly known as:  GLUCOPHAGE-XR TAKE 4 TABLETS (2,000 MG TOTAL) BY MOUTH DAILY.   testosterone cypionate 200 MG/ML injection Commonly known as:  DEPOTESTOSTERONE CYPIONATE INJECT 1.5 ML  EVERY 2 WEEKS.   VIAGRA 100 MG tablet Generic drug:  sildenafil TAKE 1/2 TO 1 TABLET ONCE DAILY AS NEEDED   Vitamin D-3 5000 units Tabs Take 5,000 Units by mouth daily.       Allergies: No Known Allergies  Past Medical History, Surgical history, Social history, and Family History were reviewed and updated.  Review of Systems: All other 10 point review of systems is negative.   Physical Exam:  weight is 286 lb 1 oz (129.8 kg). His oral temperature is 98.6 F (37 C). His blood pressure is 100/70 and his pulse is 72.   Wt Readings from Last 3 Encounters:  06/13/16 286 lb 1 oz (129.8 kg)  05/30/16 287 lb (130.2 kg)  03/30/16 283 lb (128.4 kg)    Ocular: Sclerae unicteric, pupils equal, round and reactive to light Ear-nose-throat: Oropharynx clear, dentition fair Lymphatic: No cervical supraclavicular or axillary adenopathy Lungs no rales or rhonchi, good excursion bilaterally Heart regular rate and rhythm, no murmur appreciated Abd soft, nontender, positive bowel sounds, no liver or spleen tip palpated on exam MSK no focal spinal tenderness, no joint edema Neuro: non-focal, well-oriented, appropriate affect Breasts: Deferred   Lab Results  Component Value Date   WBC 5.8 06/13/2016   HGB 17.1 06/13/2016   HCT 50.8 (H) 06/13/2016   MCV 84 06/13/2016   PLT 236 06/13/2016   Lab Results  Component Value Date  FERRITIN 25 05/12/2016   IRON 133 05/12/2016   TIBC 306 05/12/2016   UIBC 173 05/12/2016   IRONPCTSAT 44 05/12/2016   Lab Results  Component Value Date   RETICCTPCT 1.5 11/16/2015   RBC 6.08 (H) 06/13/2016   RETICCTABS 96,300 (H) 11/16/2015   No results found for: KPAFRELGTCHN, LAMBDASER, KAPLAMBRATIO No results found for: IGGSERUM, IGA, IGMSERUM No results found for: Odetta Pink, SPEI   Chemistry      Component Value Date/Time   NA 139 05/30/2016 1649   NA 142 05/12/2016 1418   K 4.2 05/30/2016  1649   K 4.1 05/12/2016 1418   CL 104 05/30/2016 1649   CL 104 05/12/2016 1418   CO2 25 05/30/2016 1649   CO2 28 05/12/2016 1418   BUN 19 05/30/2016 1649   BUN 24 (H) 05/12/2016 1418   CREATININE 1.62 (H) 05/30/2016 1649      Component Value Date/Time   CALCIUM 9.7 05/30/2016 1649   CALCIUM 9.8 05/12/2016 1418   ALKPHOS 115 05/30/2016 1649   ALKPHOS 133 (H) 05/12/2016 1418   AST 14 05/30/2016 1649   AST 18 05/12/2016 1418   ALT 17 05/30/2016 1649   ALT 27 05/12/2016 1418   BILITOT 1.1 05/30/2016 1649   BILITOT 1.20 05/12/2016 1418     Impression and Plan: [name] is a 56 yo African American male with erythrocytosis from occasional testosterone supplement injections. He is quite symptomatic without these. He also has hemochromatosis, heterozygous for H63D mutation.  His Hct today is 50.8 so we will proceed with phlebotomy today as planned. He is asymptomatic at this time and has no complaints.  We will see what his iron studies show and determine of he needs to be on a phlebotomy plan for a few weeks.  We will go ahead and plan to see him back in 1 month for repeat lab work, follow-up and phlebotomy.  He will contact our office with any questions or concerns. We can certainly see him sooner if need be.   Eliezer Bottom, NP 2/12/20182:49 PM

## 2016-06-14 LAB — IRON AND TIBC
%SAT: 25 % (ref 20–55)
Iron: 84 ug/dL (ref 42–163)
TIBC: 339 ug/dL (ref 202–409)
UIBC: 255 ug/dL (ref 117–376)

## 2016-06-14 LAB — HEMOGLOBIN A1C
ESTIMATED AVERAGE GLUCOSE: 269 mg/dL
Hemoglobin A1c: 11 % — ABNORMAL HIGH (ref 4.8–5.6)

## 2016-06-14 LAB — FERRITIN: FERRITIN: 23 ng/mL (ref 22–316)

## 2016-06-26 ENCOUNTER — Other Ambulatory Visit: Payer: Self-pay | Admitting: Physician Assistant

## 2016-06-28 ENCOUNTER — Other Ambulatory Visit: Payer: Self-pay | Admitting: Internal Medicine

## 2016-06-28 DIAGNOSIS — E1122 Type 2 diabetes mellitus with diabetic chronic kidney disease: Secondary | ICD-10-CM

## 2016-06-28 DIAGNOSIS — N183 Chronic kidney disease, stage 3 (moderate): Principal | ICD-10-CM

## 2016-07-07 ENCOUNTER — Other Ambulatory Visit: Payer: Self-pay | Admitting: Physician Assistant

## 2016-07-14 ENCOUNTER — Ambulatory Visit (HOSPITAL_BASED_OUTPATIENT_CLINIC_OR_DEPARTMENT_OTHER): Payer: BLUE CROSS/BLUE SHIELD

## 2016-07-14 ENCOUNTER — Encounter: Payer: Self-pay | Admitting: Family

## 2016-07-14 ENCOUNTER — Other Ambulatory Visit (HOSPITAL_BASED_OUTPATIENT_CLINIC_OR_DEPARTMENT_OTHER): Payer: BLUE CROSS/BLUE SHIELD

## 2016-07-14 ENCOUNTER — Ambulatory Visit (HOSPITAL_BASED_OUTPATIENT_CLINIC_OR_DEPARTMENT_OTHER): Payer: BLUE CROSS/BLUE SHIELD | Admitting: Family

## 2016-07-14 DIAGNOSIS — E349 Endocrine disorder, unspecified: Secondary | ICD-10-CM

## 2016-07-14 LAB — CBC WITH DIFFERENTIAL (CANCER CENTER ONLY)
BASO#: 0.1 10*3/uL (ref 0.0–0.2)
BASO%: 1 % (ref 0.0–2.0)
EOS%: 3.2 % (ref 0.0–7.0)
Eosinophils Absolute: 0.2 10*3/uL (ref 0.0–0.5)
HCT: 49.2 % (ref 38.7–49.9)
HGB: 16.6 g/dL (ref 13.0–17.1)
LYMPH#: 1.9 10*3/uL (ref 0.9–3.3)
LYMPH%: 36.7 % (ref 14.0–48.0)
MCH: 28.2 pg (ref 28.0–33.4)
MCHC: 33.7 g/dL (ref 32.0–35.9)
MCV: 84 fL (ref 82–98)
MONO#: 0.3 10*3/uL (ref 0.1–0.9)
MONO%: 5.4 % (ref 0.0–13.0)
NEUT#: 2.7 10*3/uL (ref 1.5–6.5)
NEUT%: 53.7 % (ref 40.0–80.0)
Platelets: 231 10*3/uL (ref 145–400)
RBC: 5.89 10*6/uL — ABNORMAL HIGH (ref 4.20–5.70)
RDW: 13.8 % (ref 11.1–15.7)
WBC: 5 10*3/uL (ref 4.0–10.0)

## 2016-07-14 LAB — CMP (CANCER CENTER ONLY)
ALT(SGPT): 24 U/L (ref 10–47)
AST: 25 U/L (ref 11–38)
Albumin: 3.9 g/dL (ref 3.3–5.5)
Alkaline Phosphatase: 172 U/L — ABNORMAL HIGH (ref 26–84)
BUN: 22 mg/dL (ref 7–22)
CHLORIDE: 101 meq/L (ref 98–108)
CO2: 28 mEq/L (ref 18–33)
CREATININE: 1.8 mg/dL — AB (ref 0.6–1.2)
Calcium: 9.2 mg/dL (ref 8.0–10.3)
Glucose, Bld: 278 mg/dL — ABNORMAL HIGH (ref 73–118)
POTASSIUM: 3.9 meq/L (ref 3.3–4.7)
SODIUM: 141 meq/L (ref 128–145)
Total Bilirubin: 1.2 mg/dl (ref 0.20–1.60)
Total Protein: 6.8 g/dL (ref 6.4–8.1)

## 2016-07-14 NOTE — Progress Notes (Signed)
Carlos [name] presents today for phlebotomy per MD orders. Phlebotomy procedure started at 1450 and ended at 1456  530  grams removed via 16 G LAC Patient declined to be observed for 30 minutes after procedure without any incident. Patient tolerated procedure well. Diet and nutrition offered.

## 2016-07-14 NOTE — Progress Notes (Signed)
Hematology and Oncology Follow Up Visit  Carlos [name] [id number] 23-May-1960 56 y.o. 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 He is doing well and recently returned from a trip to [place] for work. He is hoping to buy some property in [place] on his next trip. He is asymptomatic and has no complaints at this time. His flight is 8 hours and he makes sure to stay hydrated and get up and walk the isle. We also discussed him wearing thigh high compression stockings on long flights. He will consider this.   His Hct is 49.2% so we will proceed with a phlebotomy. He continues to take his baby aspirin daily.  He is still doing testosterone injections monthly as indicated. His last testosterone level in January was 92.  His BP and HR today are stable.   No fever, chills, n/v, cough, rash, dizziness, SOB, chest pain, palpitations, abdominal pain or changes in bowel or bladder habits.  No tenderness, numbness or tingling in his extremities. He has chronic puffiness in his left ankle from an old war injury.  He has maintained a good appetite and is staying well hydrated. His weight is stable.  His blood sugars are still not well controlled.   Medications:  Allergies as of 07/14/2016   No Known Allergies     Medication List       Accurate as of 07/14/16  2:35 PM. Always use your most recent med list.          aspirin EC 81 MG tablet Take 81 mg by mouth daily.   JARDIANCE 25 MG Tabs tablet Generic drug:  empagliflozin TAKE 1 TABLET DAILY   liraglutide 18 MG/3ML Sopn Commonly known as:  VICTOZA INJECT 1.8 MGS DAILY AS DIRECTED   lisinopril 20 MG tablet Commonly known as:  PRINIVIL,ZESTRIL Take 20 mg by mouth daily.     metFORMIN 500 MG 24 hr tablet Commonly known as:  GLUCOPHAGE-XR TAKE 4 TABLETS (2,000 MG TOTAL) BY MOUTH DAILY.   sildenafil 100 MG tablet Commonly known as:  VIAGRA TAKE 1/2 TO 1 TABLET ONCE DAILY AS NEEDED   testosterone cypionate 200 MG/ML injection Commonly known as:  DEPOTESTOSTERONE CYPIONATE INJECT 1.5 ML EVERY 2 WEEKS.   Vitamin D-3 5000 units Tabs Take 5,000 Units by mouth daily.       Allergies: No Known Allergies  Past Medical History, Surgical history, Social history, and Family History were reviewed and updated.  Review of Systems: All other 10 point review of systems is negative.   Physical Exam:  weight is 289 lb (131.1 kg). His oral temperature is 97.9 F (36.6 C). His blood pressure is 127/73 and his pulse is 71. His respiration is 16 and oxygen saturation is 98%.   Wt Readings from Last 3 Encounters:  07/14/16 289 lb (131.1 kg)  06/13/16 286 lb 1 oz (129.8 kg)  05/30/16 287 lb (130.2 kg)    Ocular: Sclerae unicteric, pupils equal, round and reactive to light Ear-nose-throat: Oropharynx clear, dentition fair Lymphatic: No cervical supraclavicular or axillary adenopathy Lungs no rales or rhonchi, good excursion bilaterally Heart regular rate and rhythm, no murmur appreciated Abd soft, nontender, positive bowel sounds, no liver or spleen tip palpated on exam, no  fluid wave MSK no focal spinal tenderness, no joint edema Neuro: non-focal, well-oriented, appropriate affect Breasts: Deferred   Lab Results  Component Value Date   WBC 5.0 07/14/2016   HGB 16.6 07/14/2016   HCT 49.2 07/14/2016   MCV 84 07/14/2016   PLT 231 07/14/2016   Lab Results  Component Value Date   FERRITIN 23 06/13/2016   IRON 84 06/13/2016   TIBC 339 06/13/2016   UIBC 255 06/13/2016   IRONPCTSAT 25 06/13/2016   Lab Results  Component Value Date   RETICCTPCT 1.5 11/16/2015   RBC 5.89 (H) 07/14/2016   RETICCTABS 96,300 (H) 11/16/2015   No results found for:  KPAFRELGTCHN, LAMBDASER, KAPLAMBRATIO No results found for: IGGSERUM, IGA, IGMSERUM No results found for: Odetta Pink, SPEI   Chemistry      Component Value Date/Time   NA 135 06/13/2016 1407   NA 142 05/12/2016 1418   K 4.2 06/13/2016 1407   K 4.1 05/12/2016 1418   CL 100 06/13/2016 1407   CL 104 05/12/2016 1418   CO2 28 06/13/2016 1407   CO2 28 05/12/2016 1418   BUN 19 06/13/2016 1407   BUN 24 (H) 05/12/2016 1418   CREATININE 1.73 (H) 06/13/2016 1407   CREATININE 1.62 (H) 05/30/2016 1649      Component Value Date/Time   CALCIUM 10.0 06/13/2016 1407   CALCIUM 9.8 05/12/2016 1418   ALKPHOS 175 (H) 06/13/2016 1407   ALKPHOS 133 (H) 05/12/2016 1418   AST 16 06/13/2016 1407   AST 18 05/12/2016 1418   ALT 24 06/13/2016 1407   ALT 27 05/12/2016 1418   BILITOT 1.0 06/13/2016 1407   BILITOT 1.20 05/12/2016 1418     Impression and Plan: Carlos [name] is a 56 yo African American male with erythrocytosis from occasional testosterone supplement injections. He is quite symptomatic without these. He also has hemochromatosis, heterozygous for H63D mutation.  Hct today is 49.2 so we will proceed with a phlebotomy today. He will continue on his baby aspirin daily.  We will see what his iron studies show and determine if he requires a second phlebotomy next week.  We will go ahead and plan to see him back in 1 month for repeat lab work, follow-up and phlebotomy.  He will contact our office with any questions or concerns. We can certainly see him sooner if need be.   Eliezer Bottom, NP 3/15/20182:35 PM

## 2016-07-15 LAB — TESTOSTERONE: TESTOSTERONE: 27 ng/dL — AB (ref 264–916)

## 2016-07-15 LAB — IRON AND TIBC
%SAT: 37 % (ref 20–55)
IRON: 111 ug/dL (ref 42–163)
TIBC: 300 ug/dL (ref 202–409)
UIBC: 189 ug/dL (ref 117–376)

## 2016-07-15 LAB — FERRITIN: FERRITIN: 24 ng/mL (ref 22–316)

## 2016-07-23 ENCOUNTER — Other Ambulatory Visit: Payer: Self-pay | Admitting: Internal Medicine

## 2016-08-01 ENCOUNTER — Other Ambulatory Visit: Payer: Self-pay | Admitting: *Deleted

## 2016-08-01 MED ORDER — SILDENAFIL CITRATE 100 MG PO TABS: ORAL_TABLET | ORAL | 0 refills | Status: DC

## 2016-08-10 LAB — HM DIABETES EYE EXAM

## 2016-08-11 ENCOUNTER — Ambulatory Visit (HOSPITAL_BASED_OUTPATIENT_CLINIC_OR_DEPARTMENT_OTHER): Payer: BLUE CROSS/BLUE SHIELD

## 2016-08-11 ENCOUNTER — Other Ambulatory Visit (HOSPITAL_BASED_OUTPATIENT_CLINIC_OR_DEPARTMENT_OTHER): Payer: BLUE CROSS/BLUE SHIELD

## 2016-08-11 ENCOUNTER — Ambulatory Visit (HOSPITAL_BASED_OUTPATIENT_CLINIC_OR_DEPARTMENT_OTHER): Payer: BLUE CROSS/BLUE SHIELD | Admitting: Family

## 2016-08-11 VITALS — BP 134/65 | HR 85 | Temp 98.4°F | Resp 17 | Wt 287.8 lb

## 2016-08-11 DIAGNOSIS — E349 Endocrine disorder, unspecified: Secondary | ICD-10-CM

## 2016-08-11 LAB — CBC WITH DIFFERENTIAL (CANCER CENTER ONLY)
BASO#: 0.1 10*3/uL (ref 0.0–0.2)
BASO%: 1.4 % (ref 0.0–2.0)
EOS ABS: 0.2 10*3/uL (ref 0.0–0.5)
EOS%: 2.9 % (ref 0.0–7.0)
HCT: 49.3 % (ref 38.7–49.9)
HGB: 16.6 g/dL (ref 13.0–17.1)
LYMPH#: 2.1 10*3/uL (ref 0.9–3.3)
LYMPH%: 41.1 % (ref 14.0–48.0)
MCH: 27.9 pg — AB (ref 28.0–33.4)
MCHC: 33.7 g/dL (ref 32.0–35.9)
MCV: 83 fL (ref 82–98)
MONO#: 0.3 10*3/uL (ref 0.1–0.9)
MONO%: 6.6 % (ref 0.0–13.0)
NEUT#: 2.5 10*3/uL (ref 1.5–6.5)
NEUT%: 48 % (ref 40.0–80.0)
PLATELETS: 221 10*3/uL (ref 145–400)
RBC: 5.95 10*6/uL — AB (ref 4.20–5.70)
RDW: 13.5 % (ref 11.1–15.7)
WBC: 5.1 10*3/uL (ref 4.0–10.0)

## 2016-08-11 LAB — COMPREHENSIVE METABOLIC PANEL (CC13)
A/G RATIO: 1.5 (ref 1.2–2.2)
ALT: 29 IU/L (ref 0–44)
AST: 19 IU/L (ref 0–40)
Albumin, Serum: 4.2 g/dL (ref 3.5–5.5)
Alkaline Phosphatase, S: 155 IU/L — ABNORMAL HIGH (ref 39–117)
BUN/Creatinine Ratio: 16 (ref 9–20)
BUN: 26 mg/dL — ABNORMAL HIGH (ref 6–24)
Bilirubin Total: 0.6 mg/dL (ref 0.0–1.2)
Calcium, Ser: 10 mg/dL (ref 8.7–10.2)
Carbon Dioxide, Total: 26 mmol/L (ref 18–29)
Chloride, Ser: 94 mmol/L — ABNORMAL LOW (ref 96–106)
Creatinine, Ser: 1.59 mg/dL — ABNORMAL HIGH (ref 0.76–1.27)
GFR calc Af Amer: 56 mL/min/{1.73_m2} — ABNORMAL LOW (ref >59)
GFR, EST NON AFRICAN AMERICAN: 49 mL/min/{1.73_m2} — AB (ref >59)
GLOBULIN, TOTAL: 2.8 g/dL (ref 1.5–4.5)
GLUCOSE: 332 mg/dL — AB (ref 65–99)
POTASSIUM: 4.6 mmol/L (ref 3.5–5.2)
Sodium: 133 mmol/L — ABNORMAL LOW (ref 134–144)
TOTAL PROTEIN: 7 g/dL (ref 6.0–8.5)

## 2016-08-11 NOTE — Progress Notes (Signed)
Carlos [name] presents today for phlebotomy per MD orders. Phlebotomy procedure started at 1600 and ended at 1615. 500 grams removed. Patient observed for 30 minutes after procedure without any incident. Patient tolerated procedure well. IV needle removed intact.

## 2016-08-11 NOTE — Progress Notes (Signed)
Hematology and Oncology Follow Up Visit  Carlos [name] [id number] 04-Dec-1960 56 y.o. 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 He continues to do well and has no complaints at this time. Hct today is 49.3 so we will phlebotomize him today. He verbalized that he is taking his aspirin daily.  No fever, chills, night sweats, n/v, cough, rash, dizziness, SOB, chest pain, palpitations, abdominal pain or changes in bowel or bladder habits.  He states that he has not received a testosterone injection for a while. Last level was 27 and today's is pending.  Persistent chronic swelling in the left ankle due to an old war injury. No other swelling. No tenderness, numbness or tingling in his extremities. No c/o joint aches or pains.  He has maintained a good appetite and is staying well hydrated. His weight is stable.  Blood sugars are still not well controlled.   ECOG Performance Status: 0 - Asymptomatic  Medications:  Allergies as of 08/11/2016   No Known Allergies     Medication List       Accurate as of 08/11/16  4:21 PM. Always use your most recent med list.          aspirin EC 81 MG tablet Take 81 mg by mouth daily.   JARDIANCE 25 MG Tabs tablet Generic drug:  empagliflozin TAKE 1 TABLET DAILY   liraglutide 18 MG/3ML Sopn Commonly known as:  VICTOZA INJECT 1.8 MGS DAILY AS DIRECTED   lisinopril 20 MG tablet Commonly known as:  PRINIVIL,ZESTRIL TAKE 1 TABLET (20 MG TOTAL) BY MOUTH DAILY.   metFORMIN 500 MG 24 hr tablet Commonly known as:  GLUCOPHAGE-XR TAKE 4 TABLETS (2,000 MG TOTAL) BY MOUTH DAILY.   sildenafil 100 MG tablet Commonly known as:  VIAGRA TAKE 1/2 TO 1 TABLET ONCE DAILY AS NEEDED     testosterone cypionate 200 MG/ML injection Commonly known as:  DEPOTESTOSTERONE CYPIONATE INJECT 1.5 ML EVERY 2 WEEKS.   Vitamin D-3 5000 units Tabs Take 5,000 Units by mouth daily.       Allergies: No Known Allergies  Past Medical History, Surgical history, Social history, and Family History were reviewed and updated.  Review of Systems: All other 10 point review of systems is negative.   Physical Exam:  weight is 287 lb 12.8 oz (130.5 kg). His oral temperature is 98.4 F (36.9 C). His blood pressure is 134/65 and his pulse is 85. His respiration is 17 and oxygen saturation is 95%.   Wt Readings from Last 3 Encounters:  08/11/16 287 lb 12.8 oz (130.5 kg)  07/14/16 289 lb (131.1 kg)  06/13/16 286 lb 1 oz (129.8 kg)    Ocular: Sclerae unicteric, pupils equal, round and reactive to light Ear-nose-throat: Oropharynx clear, dentition fair Lymphatic: No cervical, supraclavicular or axillary adenopathy Lungs no rales or rhonchi, good excursion bilaterally Heart regular rate and rhythm, no murmur appreciated Abd soft, nontender, positive bowel sounds, no liver or spleen tip palpated on exam, no fluid wave MSK no focal spinal tenderness, no joint edema Neuro: non-focal, well-oriented, appropriate affect Breasts: Deferred   Lab Results  Component Value Date   WBC 5.1 08/11/2016   HGB 16.6 08/11/2016   HCT 49.3 08/11/2016   MCV  83 08/11/2016   PLT 221 08/11/2016   Lab Results  Component Value Date   FERRITIN 24 07/14/2016   IRON 111 07/14/2016   TIBC 300 07/14/2016   UIBC 189 07/14/2016   IRONPCTSAT 37 07/14/2016   Lab Results  Component Value Date   RETICCTPCT 1.5 11/16/2015   RBC 5.95 (H) 08/11/2016   RETICCTABS 96,300 (H) 11/16/2015   No results found for: KPAFRELGTCHN, LAMBDASER, KAPLAMBRATIO No results found for: IGGSERUM, IGA, IGMSERUM No results found for: Odetta Pink, SPEI   Chemistry      Component  Value Date/Time   NA 141 07/14/2016 1416   K 3.9 07/14/2016 1416   CL 101 07/14/2016 1416   CO2 28 07/14/2016 1416   BUN 22 07/14/2016 1416   CREATININE 1.8 (H) 07/14/2016 1416      Component Value Date/Time   CALCIUM 9.2 07/14/2016 1416   ALKPHOS 172 (H) 07/14/2016 1416   AST 25 07/14/2016 1416   ALT 24 07/14/2016 1416   BILITOT 1.20 07/14/2016 1416     Impression and Plan: Carlos [name] a 56 yo African American male with erythrocytosis from occasional testosterone supplement injections. He is quite symptomatic without these. He also has hemochromatosis, heterozygous for H63D mutation.  He has no complaints at this time and continues to do well.  Hct is 49.3 so we will phlebotomize him today. He will continue to take his baby aspirin and stay well hydrated.  We will plan to see him back in 1 months for repeat lab work and follow-up.  I spent 10 minutes face to face counseling the patient.  He will contact our office with any questions or concerns. We can certainly see him sooner if need be.   Eliezer Bottom, NP 4/12/20184:21 PM

## 2016-08-11 NOTE — Patient Instructions (Signed)
Therapeutic Phlebotomy Therapeutic phlebotomy is the controlled removal of blood from a person's body for the purpose of treating a medical condition. The procedure is similar to donating blood. Usually, about a pint (470 mL, or 0.47L) of blood is removed. The average adult has 9-12 pints (4.3-5.7 L) of blood. Therapeutic phlebotomy may be used to treat the following medical conditions:  Hemochromatosis. This is a condition in which the blood contains too much iron.  Polycythemia vera. This is a condition in which the blood contains too many red blood cells.  Porphyria cutanea tarda. This is a disease in which an important part of hemoglobin is not made properly. It results in the buildup of abnormal amounts of porphyrins in the body.  Sickle cell disease. This is a condition in which the red blood cells form an abnormal crescent shape rather than a round shape. Tell a health care provider about:  Any allergies you have.  All medicines you are taking, including vitamins, herbs, eye drops, creams, and over-the-counter medicines.  Any problems you or family members have had with anesthetic medicines.  Any blood disorders you have.  Any surgeries you have had.  Any medical conditions you have. What are the risks? Generally, this is a safe procedure. However, problems may occur, including:  Nausea or light-headedness.  Low blood pressure.  Soreness, bleeding, swelling, or bruising at the needle insertion site.  Infection. What happens before the procedure?  Follow instructions from your health care provider about eating or drinking restrictions.  Ask your health care provider about changing or stopping your regular medicines. This is especially important if you are taking diabetes medicines or blood thinners.  Wear clothing with sleeves that can be raised above the elbow.  Plan to have someone take you home after the procedure.  You may have a blood sample taken. What happens  during the procedure?  A needle will be inserted into one of your veins.  Tubing and a collection bag will be attached to that needle.  Blood will flow through the needle and tubing into the collection bag.  You may be asked to open and close your hand slowly and continually during the entire collection.  After the specified amount of blood has been removed from your body, the collection bag and tubing will be clamped.  The needle will be removed from your vein.  Pressure will be held on the site of the needle insertion to stop the bleeding.  A bandage (dressing) will be placed over the needle insertion site. The procedure may vary among health care providers and hospitals. What happens after the procedure?  Your recovery will be assessed and monitored.  You can return to your normal activities as directed by your health care provider. This information is not intended to replace advice given to you by your health care provider. Make sure you discuss any questions you have with your health care provider. Document Released: 09/20/2010 Document Revised: 12/19/2015 Document Reviewed: 04/14/2014 Elsevier Interactive Patient Education  2017 [employer]  

## 2016-08-12 LAB — FERRITIN: Ferritin: 50 ng/ml (ref 22–316)

## 2016-08-12 LAB — IRON AND TIBC
%SAT: 60 % — ABNORMAL HIGH (ref 20–55)
Iron: 175 ug/dL — ABNORMAL HIGH (ref 42–163)
TIBC: 290 ug/dL (ref 202–409)
UIBC: 115 ug/dL — AB (ref 117–376)

## 2016-08-12 LAB — TESTOSTERONE: Testosterone, Serum: 106 ng/dL — ABNORMAL LOW (ref 264–916)

## 2016-08-15 ENCOUNTER — Other Ambulatory Visit: Payer: Self-pay | Admitting: Internal Medicine

## 2016-08-31 ENCOUNTER — Encounter: Payer: Self-pay | Admitting: Internal Medicine

## 2016-08-31 ENCOUNTER — Ambulatory Visit: Payer: Self-pay | Admitting: Internal Medicine

## 2016-09-15 ENCOUNTER — Other Ambulatory Visit (HOSPITAL_BASED_OUTPATIENT_CLINIC_OR_DEPARTMENT_OTHER): Payer: BLUE CROSS/BLUE SHIELD

## 2016-09-15 ENCOUNTER — Ambulatory Visit (HOSPITAL_BASED_OUTPATIENT_CLINIC_OR_DEPARTMENT_OTHER): Payer: BLUE CROSS/BLUE SHIELD

## 2016-09-15 ENCOUNTER — Ambulatory Visit (HOSPITAL_BASED_OUTPATIENT_CLINIC_OR_DEPARTMENT_OTHER): Payer: BLUE CROSS/BLUE SHIELD | Admitting: Family

## 2016-09-15 DIAGNOSIS — E349 Endocrine disorder, unspecified: Secondary | ICD-10-CM

## 2016-09-15 LAB — COMPREHENSIVE METABOLIC PANEL (CC13)
ALBUMIN: 4.3 g/dL (ref 3.5–5.5)
ALT: 23 IU/L (ref 0–44)
AST: 15 IU/L (ref 0–40)
Albumin/Globulin Ratio: 1.7 (ref 1.2–2.2)
Alkaline Phosphatase, S: 124 IU/L — ABNORMAL HIGH (ref 39–117)
BUN/Creatinine Ratio: 12 (ref 9–20)
BUN: 18 mg/dL (ref 6–24)
Bilirubin Total: 0.6 mg/dL (ref 0.0–1.2)
CO2: 25 mmol/L (ref 18–29)
CREATININE: 1.52 mg/dL — AB (ref 0.76–1.27)
Calcium, Ser: 9.9 mg/dL (ref 8.7–10.2)
Chloride, Ser: 105 mmol/L (ref 96–106)
GFR, EST AFRICAN AMERICAN: 60 mL/min/{1.73_m2} (ref >59)
GFR, EST NON AFRICAN AMERICAN: 52 mL/min/{1.73_m2} — AB (ref >59)
GLOBULIN, TOTAL: 2.6 g/dL (ref 1.5–4.5)
Glucose: 265 mg/dL — ABNORMAL HIGH (ref 65–99)
POTASSIUM: 4.3 mmol/L (ref 3.5–5.2)
SODIUM: 137 mmol/L (ref 134–144)
TOTAL PROTEIN: 6.9 g/dL (ref 6.0–8.5)

## 2016-09-15 LAB — CBC WITH DIFFERENTIAL (CANCER CENTER ONLY)
BASO#: 0 10*3/uL (ref 0.0–0.2)
BASO%: 0.8 % (ref 0.0–2.0)
EOS ABS: 0.2 10*3/uL (ref 0.0–0.5)
EOS%: 2.8 % (ref 0.0–7.0)
HEMATOCRIT: 49 % (ref 38.7–49.9)
HEMOGLOBIN: 16.5 g/dL (ref 13.0–17.1)
LYMPH#: 2.1 10*3/uL (ref 0.9–3.3)
LYMPH%: 39.9 % (ref 14.0–48.0)
MCH: 28.2 pg (ref 28.0–33.4)
MCHC: 33.7 g/dL (ref 32.0–35.9)
MCV: 84 fL (ref 82–98)
MONO#: 0.3 10*3/uL (ref 0.1–0.9)
MONO%: 6.4 % (ref 0.0–13.0)
NEUT#: 2.7 10*3/uL (ref 1.5–6.5)
NEUT%: 50.1 % (ref 40.0–80.0)
Platelets: 220 10*3/uL (ref 145–400)
RBC: 5.86 10*6/uL — AB (ref 4.20–5.70)
RDW: 14.3 % (ref 11.1–15.7)
WBC: 5.3 10*3/uL (ref 4.0–10.0)

## 2016-09-15 NOTE — Progress Notes (Signed)
Hematology and Oncology Follow Up Visit  Carlos [name] [id number] [birth year] 56 y.o. 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 He is doing well and has no complaints at this time. Hct is 49.0 No fever, chills, n/v, cough, rash, dizziness, SOB, chest pain, palpitations, abdominal pain or changes in bowel or bladder habits.  He has chronic swelling in his left ankle due to an old war injury.  No tenderness, numbness or tingling in her extremities. No c/o joint aches or pains.  He is eating well and staying hydrated. His weight is stable. His blood sugars are still uncontrolled.   ECOG Performance Status: 0 - Asymptomatic  Medications:  Allergies as of 09/15/2016   No Known Allergies     Medication List       Accurate as of 09/15/16  4:06 PM. Always use your most recent med list.          aspirin EC 81 MG tablet Take 81 mg by mouth daily.   JARDIANCE 25 MG Tabs tablet Generic drug:  empagliflozin TAKE 1 TABLET DAILY   liraglutide 18 MG/3ML Sopn Commonly known as:  VICTOZA INJECT 1.8 MGS DAILY AS DIRECTED   lisinopril 20 MG tablet Commonly known as:  PRINIVIL,ZESTRIL TAKE 1 TABLET (20 MG TOTAL) BY MOUTH DAILY.   metFORMIN 500 MG 24 hr tablet Commonly known as:  GLUCOPHAGE-XR TAKE 4 TABLETS (2,000 MG TOTAL) BY MOUTH DAILY.   sildenafil 100 MG tablet Commonly known as:  VIAGRA TAKE 1/2 TO 1 TABLET ONCE DAILY AS NEEDED   testosterone cypionate 200 MG/ML injection Commonly known as:  DEPOTESTOSTERONE CYPIONATE INJECT 1.5MLS EVERY 2 WEEKS   Vitamin D-3 5000 units Tabs Take 5,000 Units by mouth daily.       Allergies: No Known Allergies  Past Medical History, Surgical history, Social history,  and Family History were reviewed and updated.  Review of Systems: All other 10 point review of systems is negative.   Physical Exam:  weight is 293 lb (132.9 kg). His oral temperature is 98.3 F (36.8 C). His blood pressure is 134/68 and his pulse is 89. His respiration is 18 and oxygen saturation is 91%.   Wt Readings from Last 3 Encounters:  09/15/16 293 lb (132.9 kg)  08/11/16 287 lb 12.8 oz (130.5 kg)  07/14/16 289 lb (131.1 kg)    Ocular: Sclerae unicteric, pupils equal, round and reactive to light Ear-nose-throat: Oropharynx clear, dentition fair Lymphatic: No cervical, supraclavicular or axillary adenopathy Lungs no rales or rhonchi, good excursion bilaterally Heart regular rate and rhythm, no murmur appreciated Abd soft, nontender, positive bowel sounds, no liver or spleen tip palpated on exam, no fluid wave MSK no focal spinal tenderness, no joint edema Neuro: non-focal, well-oriented, appropriate affect Breasts: Deferred  Lab Results  Component Value Date   WBC 5.3 09/15/2016   HGB 16.5 09/15/2016   HCT 49.0 09/15/2016   MCV 84 09/15/2016   PLT 220 09/15/2016   Lab Results  Component Value Date   FERRITIN 50 08/11/2016   IRON 175 (H) 08/11/2016   TIBC 290 08/11/2016   UIBC 115 (L) 08/11/2016   IRONPCTSAT 60 (H) 08/11/2016   Lab Results  Component Value Date   RETICCTPCT 1.5 11/16/2015  RBC 5.86 (H) 09/15/2016   RETICCTABS 96,300 (H) 11/16/2015   No results found for: KPAFRELGTCHN, LAMBDASER, KAPLAMBRATIO No results found for: IGGSERUM, IGA, IGMSERUM No results found for: Ronnald Ramp, A1GS, A2GS, Violet Baldy, MSPIKE, SPEI   Chemistry      Component Value Date/Time   NA 133 (L) 08/11/2016 1455   NA 141 07/14/2016 1416   K 4.6 08/11/2016 1455   K 3.9 07/14/2016 1416   CL 94 (L) 08/11/2016 1455   CL 101 07/14/2016 1416   CO2 26 08/11/2016 1455   CO2 28 07/14/2016 1416   BUN 26 (H) 08/11/2016 1455   BUN 22 07/14/2016 1416    CREATININE 1.59 (H) 08/11/2016 1455   CREATININE 1.8 (H) 07/14/2016 1416      Component Value Date/Time   CALCIUM 10.0 08/11/2016 1455   CALCIUM 9.2 07/14/2016 1416   ALKPHOS 155 (H) 08/11/2016 1455   ALKPHOS 172 (H) 07/14/2016 1416   AST 19 08/11/2016 1455   AST 25 07/14/2016 1416   ALT 29 08/11/2016 1455   ALT 24 07/14/2016 1416   BILITOT 0.6 08/11/2016 1455   BILITOT 1.20 07/14/2016 1416      Impression and Plan: Carlos [name] is a 56 yo African American male with erythrocytosis from occasional testosterone injections. He also has hemochromatosis, heterozygous for the H63D mutation.  His Hct is 49.0 so we will proceed with phlebotomy today.  He will continue to take his aspirin daily.  We will plan to see him back in 1 month for repeat lab work and follow-up.  He will contact our office with any questions or concerns. We can certainly see him sooner if need be.   Eliezer Bottom, NP 5/17/20184:06 PM

## 2016-09-15 NOTE — Patient Instructions (Signed)
     Therapeutic Phlebotomy, Care After Refer to this sheet in the next few weeks. These instructions provide you with information about caring for yourself after your procedure. Your health care provider may also give you more specific instructions. Your treatment has been planned according to current medical practices, but problems sometimes occur. Call your health care provider if you have any problems or questions after your procedure. What can I expect after the procedure? After the procedure, it is common to have:  Light-headedness or dizziness. You may feel faint.  Nausea.  Tiredness. Follow these instructions at home: Activity  Return to your normal activities as directed by your health care provider. Most people can go back to their normal activities right away.  Avoid strenuous physical activity and heavy lifting or pulling for about 5 hours after the procedure. Do not lift anything that is heavier than 10 lb (4.5 kg).  Athletes should avoid strenuous exercise for at least 12 hours.  Change positions slowly for the remainder of the day. This will help to prevent light-headedness or fainting.  If you feel light-headed, lie down until the feeling goes away. Eating and drinking  Be sure to eat well-balanced meals for the next 24 hours.  Drink enough fluid to keep your urine clear or pale yellow.  Avoid drinking alcohol on the day that you had the procedure. Care of the Needle Insertion Site  Keep your bandage dry. You can remove the bandage after about 5 hours or as directed by your health care provider.  If you have bleeding from the needle insertion site, elevate your arm and press firmly on the site until the bleeding stops.  If you have bruising at the site, apply ice to the area:  Put ice in a plastic bag.  Place a towel between your skin and the bag.  Leave the ice on for 20 minutes, 2-3 times a day for the first 24 hours.  If the swelling does not go away  after 24 hours, apply a warm, moist washcloth to the area for 20 minutes, 2-3 times a day. General instructions  Avoid smoking for at least 30 minutes after the procedure.  Keep all follow-up visits as directed by your health care provider. It is important to continue with further therapeutic phlebotomy treatments as directed. Contact a health care provider if:  You have redness, swelling, or pain at the needle insertion site.  You have fluid, blood, or pus coming from the needle insertion site.  You feel light-headed, dizzy, or nauseated, and the feeling does not go away.  You notice new bruising at the needle insertion site.  You feel weaker than normal.  You have a fever or chills. Get help right away if:  You have severe nausea or vomiting.  You have chest pain.  You have trouble breathing. This information is not intended to replace advice given to you by your health care provider. Make sure you discuss any questions you have with your health care provider. Document Released: 09/20/2010 Document Revised: 12/19/2015 Document Reviewed: 04/14/2014 Elsevier Interactive Patient Education  2017 [employer]  

## 2016-09-16 LAB — FERRITIN: Ferritin: 24 ng/ml (ref 22–316)

## 2016-09-16 LAB — IRON AND TIBC
%SAT: 44 % (ref 20–55)
IRON: 128 ug/dL (ref 42–163)
TIBC: 293 ug/dL (ref 202–409)
UIBC: 165 ug/dL (ref 117–376)

## 2016-09-16 LAB — TESTOSTERONE: Testosterone, Serum: >1500 ng/dL — ABNORMAL HIGH (ref 264–916)

## 2016-09-18 NOTE — Progress Notes (Signed)
Assessment and Plan:  Hypertension:  -well controlled -Continue medication -monitor blood pressure at home. -Continue DASH diet -Reminder to go to the ER if any CP, SOB, nausea, dizziness, severe HA, changes vision/speech, left arm numbness and tingling and jaw pain.  Cholesterol - Continue diet and exercise -Check cholesterol. -likely need medication  Diabetes with diabetic chronic kidney disease  -Continue diet and exercise.  -Check A1C - will switch to soliqua, samples given, follow up 1 month  Vitamin D Def -continue medications.   Hemochromatosis -follows with Dr. Marin Olp -recently had phlebotomy  Testosterone Def -is still doing injections - will try gel if insurance covers it   Continue diet and meds as discussed. Further disposition pending results of labs. Discussed med's effects and SE's.    HPI 56 y.o. male  presents for 3 month follow up with hypertension, hyperlipidemia, diabetes and vitamin D deficiency.   His blood pressure has been controlled at home, today their BP is BP: 118/74.He does workout. He denies chest pain, shortness of breath, dizziness.   He is on cholesterol medication and denies myalgias. His cholesterol is at goal. The cholesterol was:  05/30/2016: Cholesterol 153; HDL 50; LDL Cholesterol 46; Triglycerides 283   He has been working on diet and exercise for diabetes with diabetic chronic kidney disease, he is on bASA, he is on ACE/ARB, sugars are 140- 240 in the morning, and denies  foot ulcerations, hyperglycemia, hypoglycemia , increased appetite, nausea, paresthesia of the feet, polydipsia, polyuria, visual disturbances, vomiting and weight loss. Victoza, jardiance, and meformin.  Last A1C was: 06/13/2016: Hemoglobin A1c 11.0.    He is still following with Dr. Marin Olp for his hemachromatosis.  He did recently have some phlebotomy.  They still believe a large portion of this is due to testosterone use.    He is not exercising currently.  He is  gaining weight.  He reports that a lot of this is due to the fact that he is now very sedentary at work.     Patient is on Vitamin D supplement. No results found for requested labs within last 8760 hours.    Current Medications:  Current Outpatient Prescriptions on File Prior to Visit  Medication Sig Dispense Refill  . aspirin EC 81 MG tablet Take 81 mg by mouth daily.    . Cholecalciferol (VITAMIN D-3) 5000 UNITS TABS Take 5,000 Units by mouth daily.    Marland Kitchen JARDIANCE 25 MG TABS tablet TAKE 1 TABLET DAILY 90 tablet 1  . Liraglutide (VICTOZA) 18 MG/3ML SOPN INJECT 1.8 MGS DAILY AS DIRECTED 9 pen 2  . lisinopril (PRINIVIL,ZESTRIL) 20 MG tablet TAKE 1 TABLET (20 MG TOTAL) BY MOUTH DAILY. 90 tablet 0  . metFORMIN (GLUCOPHAGE-XR) 500 MG 24 hr tablet TAKE 4 TABLETS (2,000 MG TOTAL) BY MOUTH DAILY. 360 tablet 0  . sildenafil (VIAGRA) 100 MG tablet TAKE 1/2 TO 1 TABLET ONCE DAILY AS NEEDED 18 tablet 0  . testosterone cypionate (DEPOTESTOSTERONE CYPIONATE) 200 MG/ML injection INJECT 1.5MLS EVERY 2 WEEKS 10 mL 0   No current facility-administered medications on file prior to visit.    Medical History:  Past Medical History:  Diagnosis Date  . History of kidney stones   . Hypogonadism male   . Left ureteral stone   . OSA on CPAP   . Right bundle branch block   . Type 2 diabetes mellitus (HCC)    Allergies: No Known Allergies   Review of Systems:  Review of Systems  Constitutional: Negative for  chills, fever and malaise/fatigue.  HENT: Negative for congestion, ear pain and sore throat.   Eyes: Negative.   Respiratory: Negative for cough, shortness of breath and wheezing.   Cardiovascular: Negative for chest pain, palpitations and leg swelling.  Gastrointestinal: Negative for abdominal pain, blood in stool, constipation, diarrhea, heartburn and melena.  Genitourinary: Negative.   Skin: Negative.   Neurological: Negative for dizziness, sensory change, loss of consciousness and headaches.   Psychiatric/Behavioral: Negative for depression. The patient is not nervous/anxious and does not have insomnia.     Family history- Review and unchanged  Social history- Review and unchanged  Physical Exam: BP 118/74   Pulse (!) 104   Temp 97.9 F (36.6 C)   Resp 16   Ht 6\' 1"  (1.854 m)   Wt 293 lb 3.2 oz (133 kg)   SpO2 97%   BMI 38.68 kg/m  Wt Readings from Last 3 Encounters:  09/19/16 293 lb 3.2 oz (133 kg)  09/15/16 293 lb (132.9 kg)  08/11/16 287 lb 12.8 oz (130.5 kg)   General Appearance: Well nourished well developed, non-toxic appearing, in no apparent distress. Eyes: PERRLA, EOMs, conjunctiva no swelling or erythema ENT/Mouth: Ear canals clear with no erythema, swelling, or discharge.  TMs normal bilaterally, oropharynx clear, moist, with no exudate.   Neck: Supple, thyroid normal, no JVD, no cervical adenopathy.  Respiratory: Respiratory effort normal, breath sounds clear A&P, no wheeze, rhonchi or rales noted.  No retractions, no accessory muscle usage Cardio: RRR with no MRGs. No noted edema.  Abdomen: Soft, + BS.  Non tender, no guarding, rebound, hernias, masses. Musculoskeletal: Full ROM, 5/5 strength, Normal gait Skin: Warm, dry without rashes, lesions, ecchymosis.  Neuro: Awake and oriented X 3, Cranial nerves intact. No cerebellar symptoms.  Psych: normal affect, Insight and Judgment appropriate.    Vicie Mutters, PA-C 4:41 PM [facility]

## 2016-09-19 ENCOUNTER — Ambulatory Visit (INDEPENDENT_AMBULATORY_CARE_PROVIDER_SITE_OTHER): Payer: BLUE CROSS/BLUE SHIELD | Admitting: Physician Assistant

## 2016-09-19 ENCOUNTER — Encounter: Payer: Self-pay | Admitting: Physician Assistant

## 2016-09-19 VITALS — BP 118/74 | HR 104 | Temp 97.9°F | Resp 16 | Ht 73.0 in | Wt 293.2 lb

## 2016-09-19 DIAGNOSIS — E349 Endocrine disorder, unspecified: Secondary | ICD-10-CM | POA: Diagnosis not present

## 2016-09-19 DIAGNOSIS — E559 Vitamin D deficiency, unspecified: Secondary | ICD-10-CM | POA: Diagnosis not present

## 2016-09-19 DIAGNOSIS — Z79899 Other long term (current) drug therapy: Secondary | ICD-10-CM | POA: Diagnosis not present

## 2016-09-19 DIAGNOSIS — E782 Mixed hyperlipidemia: Secondary | ICD-10-CM

## 2016-09-19 DIAGNOSIS — I10 Essential (primary) hypertension: Secondary | ICD-10-CM | POA: Diagnosis not present

## 2016-09-19 DIAGNOSIS — E1121 Type 2 diabetes mellitus with diabetic nephropathy: Secondary | ICD-10-CM

## 2016-09-19 LAB — CBC WITH DIFFERENTIAL/PLATELET
BASOS ABS: 60 {cells}/uL (ref 0–200)
Basophils Relative: 1 %
EOS PCT: 3 %
Eosinophils Absolute: 180 cells/uL (ref 15–500)
HCT: 48.7 % (ref 38.5–50.0)
Hemoglobin: 15.9 g/dL (ref 13.2–17.1)
LYMPHS PCT: 40 %
Lymphs Abs: 2400 cells/uL (ref 850–3900)
MCH: 27.5 pg (ref 27.0–33.0)
MCHC: 32.6 g/dL (ref 32.0–36.0)
MCV: 84.3 fL (ref 80.0–100.0)
MPV: 10.9 fL (ref 7.5–12.5)
Monocytes Absolute: 360 cells/uL (ref 200–950)
Monocytes Relative: 6 %
NEUTROS PCT: 50 %
Neutro Abs: 3000 cells/uL (ref 1500–7800)
Platelets: 276 10*3/uL (ref 140–400)
RBC: 5.78 MIL/uL (ref 4.20–5.80)
RDW: 15.2 % — AB (ref 11.0–15.0)
WBC: 6 10*3/uL (ref 3.8–10.8)

## 2016-09-19 MED ORDER — TESTOSTERONE 40.5 MG/2.5GM (1.62%) TD GEL: TRANSDERMAL | 3 refills | Status: DC

## 2016-09-19 MED ORDER — INSULIN GLARGINE-LIXISENATIDE 100-33 UNT-MCG/ML ~~LOC~~ SOPN: 40.0000 [IU] | PEN_INJECTOR | Freq: Every morning | SUBCUTANEOUS | 1 refills | Status: DC

## 2016-09-19 NOTE — Patient Instructions (Signed)
Switch to the soliqua Stop the victoza Start with 20 units of the soliqua and go up 4 units every 4 days if your MORNING sugar is above 150 If you have any low blood sugars, anything below 120 back off by 3 units  Keep working on diet/exercise   Your A1C is a measure of your sugar over the past 3 months and is not affected by what you have eaten over the past few days. Diabetes increases your chances of stroke and heart attack over 300 % and is the leading cause of blindness and kidney failure in the [place]. Please make sure you decrease bad carbs like white bread, white rice, potatoes, corn, soft drinks, pasta, cereals, refined sugars, sweet tea, dried fruits, and fruit juice. Good carbs are okay to eat in moderation like sweet potatoes, brown rice, whole grain pasta/bread, most fruit (except dried fruit) and you can eat as many veggies as you want.   Greater than 6.5 is considered diabetic. Between 6.4 and 5.7 is prediabetic If your A1C is less than 5.7 you are NOT diabetic.  Targets for Glucose Readings: Time of Check Target for patients WITHOUT Diabetes Target for DIABETICS  Before Meals Less than 100  less than 150  Two hours after meals Less than 200  Less than 250       Bad carbs also include fruit juice, alcohol, and sweet tea. These are empty calories that do not signal to your brain that you are full.   Please remember the good carbs are still carbs which convert into sugar. So please measure them out no more than 1/2-1 cup of rice, oatmeal, pasta, and beans  Veggies are however free foods! Pile them on.   Not all fruit is created equal. Please see the list below, the fruit at the bottom is higher in sugars than the fruit at the top. Please avoid all dried fruits.     Simple math prevails.    1st - exercise does not produce significant weight loss - at best one converts fat into muscle , "bulks up", loses inches, but usually stays "weight neutral"     2nd - think of  your body weightas a check book: If you eat more calories than you burn up - you save money or gain weight .... Or if you spend more money than you put in the check book, ie burn up more calories than you eat, then you lose weight     3rd - if you walk or run 1 mile, you burn up 100 calories - you have to burn up 3,500 calories to lose 1 pound, ie you have to walk/run 35 miles to lose 1 measly pound. So if you want to lose 10 #, then you have to walk/run 350 miles, so.... clearly exercise is not the solution.     4. So if you consume 1,500 calories, then you have to burn up the equivalent of 15 miles to stay weight neutral - It also stands to reason that if you consume 1,500 cal/day and don't lose weight, then you must be burning up about 1,500 cals/day to stay weight neutral.     5. If you really want to lose weight, you must cut your calorie intake 300 calories /day and at that rate you should lose about 1 # every 3 days.   6. Please purchase Carlos [name] book(s) "The End of Dieting" & "Eat to Live" . It has some great concepts and recipes.

## 2016-09-20 LAB — LIPID PANEL
CHOL/HDL RATIO: 3.6 ratio (ref <5.0)
CHOLESTEROL: 155 mg/dL (ref <200)
HDL: 43 mg/dL (ref >40)
LDL Cholesterol: 68 mg/dL (ref <100)
TRIGLYCERIDES: 221 mg/dL — AB (ref <150)
VLDL: 44 mg/dL — ABNORMAL HIGH (ref <30)

## 2016-09-20 LAB — BASIC METABOLIC PANEL WITH GFR
BUN: 18 mg/dL (ref 7–25)
CALCIUM: 9.8 mg/dL (ref 8.6–10.3)
CO2: 21 mmol/L (ref 20–31)
CREATININE: 1.55 mg/dL — AB (ref 0.70–1.33)
Chloride: 106 mmol/L (ref 98–110)
GFR, EST AFRICAN AMERICAN: 58 mL/min — AB (ref >=60)
GFR, EST NON AFRICAN AMERICAN: 50 mL/min — AB (ref >=60)
GLUCOSE: 158 mg/dL — AB (ref 65–99)
Potassium: 4.5 mmol/L (ref 3.5–5.3)
SODIUM: 142 mmol/L (ref 135–146)

## 2016-09-20 LAB — HEPATIC FUNCTION PANEL
ALT: 18 U/L (ref 9–46)
AST: 13 U/L (ref 10–35)
Albumin: 4.4 g/dL (ref 3.6–5.1)
Alkaline Phosphatase: 112 U/L (ref 40–115)
BILIRUBIN DIRECT: 0.2 mg/dL (ref <=0.2)
BILIRUBIN INDIRECT: 0.6 mg/dL (ref 0.2–1.2)
Total Bilirubin: 0.8 mg/dL (ref 0.2–1.2)
Total Protein: 7 g/dL (ref 6.1–8.1)

## 2016-09-20 LAB — TSH: TSH: 1.15 m[IU]/L (ref 0.40–4.50)

## 2016-09-20 LAB — MAGNESIUM: Magnesium: 2 mg/dL (ref 1.5–2.5)

## 2016-09-20 LAB — HEMOGLOBIN A1C
Hgb A1c MFr Bld: 10.1 % — ABNORMAL HIGH (ref <5.7)
MEAN PLASMA GLUCOSE: 243 mg/dL

## 2016-10-06 ENCOUNTER — Telehealth: Payer: Self-pay

## 2016-10-06 NOTE — Telephone Encounter (Signed)
Pt reports that he went to pharmacy and androgel was not there for pick up.   I informed pt that I would call his gel into [facility].  Rx was called into pharmacy on 7th June 2018 @ 11:53am by DD

## 2016-10-12 ENCOUNTER — Other Ambulatory Visit: Payer: Self-pay | Admitting: *Deleted

## 2016-10-12 MED ORDER — GLUCOSE BLOOD VI STRP: ORAL_STRIP | 1 refills | Status: DC

## 2016-10-13 ENCOUNTER — Ambulatory Visit (HOSPITAL_BASED_OUTPATIENT_CLINIC_OR_DEPARTMENT_OTHER): Payer: BLUE CROSS/BLUE SHIELD | Admitting: Family

## 2016-10-13 ENCOUNTER — Ambulatory Visit (HOSPITAL_BASED_OUTPATIENT_CLINIC_OR_DEPARTMENT_OTHER): Payer: BLUE CROSS/BLUE SHIELD

## 2016-10-13 ENCOUNTER — Other Ambulatory Visit (HOSPITAL_BASED_OUTPATIENT_CLINIC_OR_DEPARTMENT_OTHER): Payer: BLUE CROSS/BLUE SHIELD

## 2016-10-13 DIAGNOSIS — D751 Secondary polycythemia: Secondary | ICD-10-CM

## 2016-10-13 DIAGNOSIS — E349 Endocrine disorder, unspecified: Secondary | ICD-10-CM

## 2016-10-13 LAB — CBC WITH DIFFERENTIAL (CANCER CENTER ONLY)
BASO#: 0 10*3/uL (ref 0.0–0.2)
BASO%: 0.5 % (ref 0.0–2.0)
EOS ABS: 0.1 10*3/uL (ref 0.0–0.5)
EOS%: 2.4 % (ref 0.0–7.0)
HCT: 48.8 % (ref 38.7–49.9)
HGB: 16.4 g/dL (ref 13.0–17.1)
LYMPH#: 1.8 10*3/uL (ref 0.9–3.3)
LYMPH%: 31.5 % (ref 14.0–48.0)
MCH: 28 pg (ref 28.0–33.4)
MCHC: 33.6 g/dL (ref 32.0–35.9)
MCV: 83 fL (ref 82–98)
MONO#: 0.4 10*3/uL (ref 0.1–0.9)
MONO%: 6.6 % (ref 0.0–13.0)
NEUT#: 3.4 10*3/uL (ref 1.5–6.5)
NEUT%: 59 % (ref 40.0–80.0)
PLATELETS: 232 10*3/uL (ref 145–400)
RBC: 5.86 10*6/uL — ABNORMAL HIGH (ref 4.20–5.70)
RDW: 14.9 % (ref 11.1–15.7)
WBC: 5.8 10*3/uL (ref 4.0–10.0)

## 2016-10-13 LAB — CMP (CANCER CENTER ONLY)
ALT(SGPT): 32 U/L (ref 10–47)
AST: 21 U/L (ref 11–38)
Albumin: 3.6 g/dL (ref 3.3–5.5)
Alkaline Phosphatase: 108 U/L — ABNORMAL HIGH (ref 26–84)
BUN: 15 mg/dL (ref 7–22)
CHLORIDE: 108 meq/L (ref 98–108)
CO2: 27 meq/L (ref 18–33)
CREATININE: 1.3 mg/dL — AB (ref 0.6–1.2)
Calcium: 9.5 mg/dL (ref 8.0–10.3)
Glucose, Bld: 146 mg/dL — ABNORMAL HIGH (ref 73–118)
POTASSIUM: 3.8 meq/L (ref 3.3–4.7)
SODIUM: 141 meq/L (ref 128–145)
Total Bilirubin: 1.1 mg/dl (ref 0.20–1.60)
Total Protein: 6.7 g/dL (ref 6.4–8.1)

## 2016-10-13 NOTE — Progress Notes (Signed)
Hematology and Oncology Follow Up Visit  Carlos [name] [id number] Sep 24, 1960 56 y.o. 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 He is doing well and has no complaints at this time. HCT today is 48.8.  He has had no new issues. No fever, chills, n/v, cough, rash, dizziness, SOB, chest pain, palpitations, abdominal pain or changes in bowel or bladder habits.  No tenderness, numbness or tingling in his extremities. He has chronic swelling in the left ankle and foot from an old injury.  He is currently enrolled in a study with [facility] and has an MRI of the brain periodically. They are assessing to see if exposure to explosions has a lasting effect on the soft tissue.  He has maintained a good appetite and is staying well hydrated. His weight is stable.  His blood sugars are much improved on his new insulin regimen. His glucose today is 146.   ECOG Performance Status: 1 - Symptomatic but completely ambulatory  Medications:  Allergies as of 10/13/2016   No Known Allergies     Medication List       Accurate as of 10/13/16  4:30 PM. Always use your most recent med list.          aspirin EC 81 MG tablet Take 81 mg by mouth daily.   glucose blood test strip Commonly known as:  CONTOUR NEXT TEST Check blood sugar 1 time a day-DX-E11.29   Insulin Glargine-Lixisenatide 100-33 UNT-MCG/ML Sopn Commonly known as:  SOLIQUA Inject 40 Units into the skin every morning.   JARDIANCE 25 MG Tabs tablet Generic drug:  empagliflozin TAKE 1 TABLET DAILY   liraglutide 18 MG/3ML Sopn Commonly known as:  VICTOZA INJECT 1.8 MGS DAILY AS DIRECTED   lisinopril 20 MG tablet Commonly known as:  PRINIVIL,ZESTRIL TAKE 1  TABLET (20 MG TOTAL) BY MOUTH DAILY.   metFORMIN 500 MG 24 hr tablet Commonly known as:  GLUCOPHAGE-XR TAKE 4 TABLETS (2,000 MG TOTAL) BY MOUTH DAILY.   sildenafil 100 MG tablet Commonly known as:  VIAGRA TAKE 1/2 TO 1 TABLET ONCE DAILY AS NEEDED   Testosterone 40.5 MG/2.5GM (1.62%) Gel Commonly known as:  ANDROGEL 2 pumps bilateral thighs or arms (4 pumps total) a day   testosterone cypionate 200 MG/ML injection Commonly known as:  DEPOTESTOSTERONE CYPIONATE INJECT 1.5MLS EVERY 2 WEEKS   Vitamin D-3 5000 units Tabs Take 5,000 Units by mouth daily.       Allergies: No Known Allergies  Past Medical History, Surgical history, Social history, and Family History were reviewed and updated.  Review of Systems: All other 10 point review of systems is negative.   Physical Exam:  weight is 299 lb (135.6 kg). His oral temperature is 98.8 F (37.1 C). His blood pressure is 115/66 and his pulse is 89. His respiration is 16 and oxygen saturation is 99%.   Wt Readings from Last 3 Encounters:  10/13/16 299 lb (135.6 kg)  09/19/16 293 lb 3.2 oz (133 kg)  09/15/16 293 lb (132.9 kg)    Ocular: Sclerae unicteric, pupils equal, round and reactive to light Ear-nose-throat: Oropharynx clear, dentition fair Lymphatic: No cervical, supraclavicular or axillary adenopathy Lungs no rales or rhonchi, good excursion bilaterally Heart regular rate  and rhythm, no murmur appreciated Abd soft, nontender, positive bowel sounds, no liver or spleen tip palpated on exam, no fluid wave MSK no focal spinal tenderness, no joint edema Neuro: non-focal, well-oriented, appropriate affect Breasts: Deferred   Lab Results  Component Value Date   WBC 5.8 10/13/2016   HGB 16.4 10/13/2016   HCT 48.8 10/13/2016   MCV 83 10/13/2016   PLT 232 10/13/2016   Lab Results  Component Value Date   FERRITIN 24 09/15/2016   IRON 128 09/15/2016   TIBC 293 09/15/2016   UIBC 165 09/15/2016   IRONPCTSAT 44  09/15/2016   Lab Results  Component Value Date   RETICCTPCT 1.5 11/16/2015   RBC 5.86 (H) 10/13/2016   RETICCTABS 96,300 (H) 11/16/2015   No results found for: KPAFRELGTCHN, LAMBDASER, KAPLAMBRATIO No results found for: IGGSERUM, IGA, IGMSERUM No results found for: Odetta Pink, SPEI   Chemistry      Component Value Date/Time   NA 141 10/13/2016 1438   K 3.8 10/13/2016 1438   CL 108 10/13/2016 1438   CO2 27 10/13/2016 1438   BUN 15 10/13/2016 1438   CREATININE 1.3 (H) 10/13/2016 1438      Component Value Date/Time   CALCIUM 9.5 10/13/2016 1438   ALKPHOS 108 (H) 10/13/2016 1438   AST 21 10/13/2016 1438   ALT 32 10/13/2016 1438   BILITOT 1.10 10/13/2016 1438      Impression and Plan: Carlos [name] is a very pleasant 56 yo African American gentleman with erythrocytosis from testosterone injections as well as hemochromatosis, heterozygous for the H63D mutation.  His Hct today is 48.8 so we will proceed with phlebotomy today.  He continues to take his aspirin daily as prescribed.  We will plan to see him back in 1 months for repeat lab work and follow-up.  He will contact our office with any questions or concerns. We can certainly see him sooner if need be.   Eliezer Bottom, NP 6/14/20184:30 PM

## 2016-10-14 LAB — IRON AND TIBC
%SAT: 24 % (ref 20–55)
IRON: 71 ug/dL (ref 42–163)
TIBC: 299 ug/dL (ref 202–409)
UIBC: 228 ug/dL (ref 117–376)

## 2016-10-14 LAB — TESTOSTERONE

## 2016-10-14 LAB — FERRITIN: Ferritin: 22 ng/ml (ref 22–316)

## 2016-10-25 NOTE — Progress Notes (Signed)
Assessment and Plan:  Type 2 diabetes mellitus with diabetic nephropathy, without long-term current use of insulin ([facility]) Continue soliqua, doing well, check A1C in 2 months -     Insulin Glargine-Lixisenatide (SOLIQUA) 100-33 UNT-MCG/ML SOPN; Inject 40 Units into the skin every morning. -     sildenafil (VIAGRA) 100 MG tablet; TAKE 1/2 TO 1 TABLET ONCE DAILY AS NEEDED  Essential hypertension - continue medications, DASH diet, exercise and monitor at home. Call if greater than 130/80.   Mixed hyperlipidemia -continue medications, check lipids, decrease fatty foods, increase activity.   Screen for colon cancer -     Ambulatory referral to Gastroenterology  Future Appointments Date Time Provider [place]  03/07/2017 11:15 AM Vicie Mutters, PA-C [facility] None    HPI 56 y.o.male presents for follow up for DM Last visit he was switch to soliquia for DM, he states his sugars are running 130's and feels okay with the medication, no low sugars, he is up to 26 and doing well.  Lab Results  Component Value Date   HGBA1C 10.1 (H) 09/19/2016   BMI is Body mass index is 39.16 kg/m., he is working on diet and exercise. Wt Readings from Last 3 Encounters:  10/26/16 296 lb 12.8 oz (134.6 kg)  10/13/16 299 lb (135.6 kg)  09/19/16 293 lb 3.2 oz (133 kg)     Past Medical History:  Diagnosis Date  . History of kidney stones   . Hypogonadism male   . Left ureteral stone   . OSA on CPAP   . Right bundle branch block   . Type 2 diabetes mellitus ([facility])      No Known Allergies  Current Outpatient Prescriptions on File Prior to Visit  Medication Sig  . aspirin EC 81 MG tablet Take 81 mg by mouth daily.  . Cholecalciferol (VITAMIN D-3) 5000 UNITS TABS Take 5,000 Units by mouth daily.  Marland Kitchen glucose blood (CONTOUR NEXT TEST) test strip Check blood sugar 1 time a day-DX-E11.29  . JARDIANCE 25 MG TABS tablet TAKE 1 TABLET DAILY  . lisinopril (PRINIVIL,ZESTRIL) 20 MG tablet TAKE 1  TABLET (20 MG TOTAL) BY MOUTH DAILY.  . metFORMIN (GLUCOPHAGE-XR) 500 MG 24 hr tablet TAKE 4 TABLETS (2,000 MG TOTAL) BY MOUTH DAILY.  Marland Kitchen Testosterone (ANDROGEL) 40.5 MG/2.5GM (1.62%) GEL 2 pumps bilateral thighs or arms (4 pumps total) a day   No current facility-administered medications on file prior to visit.     ROS: all negative except above.   Physical Exam: Filed Weights   10/26/16 1142  Weight: 296 lb 12.8 oz (134.6 kg)   BP 118/78   Pulse 81   Temp 97.7 F (36.5 C)   Resp 16   Ht 6\' 1"  (1.854 m)   Wt 296 lb 12.8 oz (134.6 kg)   BMI 39.16 kg/m  General Appearance: Well nourished, in no apparent distress. Eyes: PERRLA, EOMs, conjunctiva no swelling or erythema Sinuses: No Frontal/maxillary tenderness ENT/Mouth: Ext aud canals clear, TMs without erythema, bulging. No erythema, swelling, or exudate on post pharynx.  Tonsils not swollen or erythematous. Hearing normal.  Neck: Supple, thyroid normal.  Respiratory: Respiratory effort normal, BS equal bilaterally without rales, rhonchi, wheezing or stridor.  Cardio: RRR with no MRGs. Brisk peripheral pulses without edema.  Abdomen: Soft, + BS.  Non tender, no guarding, rebound, hernias, masses. Lymphatics: Non tender without lymphadenopathy.  Musculoskeletal: Full ROM, 5/5 strength, normal gait.  Skin: Warm, dry without rashes, lesions, ecchymosis.  Neuro: Cranial nerves  intact. Normal muscle tone, no cerebellar symptoms. Sensation intact.  Psych: Awake and oriented X 3, normal affect, Insight and Judgment appropriate.     Vicie Mutters, PA-C 12:24 PM [facility]

## 2016-10-26 ENCOUNTER — Encounter: Payer: Self-pay | Admitting: Physician Assistant

## 2016-10-26 ENCOUNTER — Ambulatory Visit (INDEPENDENT_AMBULATORY_CARE_PROVIDER_SITE_OTHER): Payer: BLUE CROSS/BLUE SHIELD | Admitting: Physician Assistant

## 2016-10-26 VITALS — BP 118/78 | HR 81 | Temp 97.7°F | Resp 16 | Ht 73.0 in | Wt 296.8 lb

## 2016-10-26 DIAGNOSIS — Z1211 Encounter for screening for malignant neoplasm of colon: Secondary | ICD-10-CM | POA: Diagnosis not present

## 2016-10-26 DIAGNOSIS — I10 Essential (primary) hypertension: Secondary | ICD-10-CM | POA: Diagnosis not present

## 2016-10-26 DIAGNOSIS — E1121 Type 2 diabetes mellitus with diabetic nephropathy: Secondary | ICD-10-CM

## 2016-10-26 DIAGNOSIS — E782 Mixed hyperlipidemia: Secondary | ICD-10-CM | POA: Diagnosis not present

## 2016-10-26 MED ORDER — SILDENAFIL CITRATE 100 MG PO TABS: ORAL_TABLET | ORAL | 1 refills | Status: DC

## 2016-10-26 MED ORDER — INSULIN GLARGINE-LIXISENATIDE 100-33 UNT-MCG/ML ~~LOC~~ SOPN: 40.0000 [IU] | PEN_INJECTOR | Freq: Every morning | SUBCUTANEOUS | 4 refills | Status: DC

## 2016-10-26 NOTE — Patient Instructions (Signed)
Diabetes is a very complicated disease...lets simplify it.  An easy way to look at it to understand the complications is if you think of the extra sugar floating in your blood stream as glass shards floating through your blood stream.    Diabetes affects your small vessels first: 1) The glass shards (sugar) scraps down the tiny blood vessels in your eyes and lead to diabetic retinopathy, the leading cause of blindness in the [place]. Diabetes is the leading cause of newly diagnosed adult (20 to 54 years of age) blindness in the [place].  2) The glass shards scratches down the tiny vessels of your legs leading to nerve damage called neuropathy and can lead to amputations of your feet. More than 60% of all non-traumatic amputations of lower limbs occur in people with diabetes.  3) Over time the small vessels in your brain are shredded and closed off, individually this does not cause any problems but over a long period of time many of the small vessels being blocked can lead to Vascular Dementia.   4) Your kidney's are a filter system and have a "net" that keeps certain things in the body and lets bad things out. Sugar shreds this net and leads to kidney damage and eventually failure. Decreasing the sugar that is destroying the net and certain blood pressure medications can help stop or decrease progression of kidney disease. Diabetes was the primary cause of kidney failure in 44 percent of all new cases in 2011.  5) Diabetes also destroys the small vessels in your penis that lead to erectile dysfunction. Eventually the vessels are so damaged that you may not be responsive to cialis or viagra.   Diabetes and your large vessels: Your larger vessels consist of your coronary arteries in your heart and the carotid vessels to your brain. Diabetes or even increased sugars put you at 300% increased risk of heart attack and stroke and this is why.. The sugar scrapes down your large blood vessels and your body  sees this as an internal injury and tries to repair itself. Just like you get a scab on your skin, your platelets will stick to the blood vessel wall trying to heal it. This is why we have diabetics on low dose aspirin daily, this prevents the platelets from sticking and can prevent plaque formation. In addition, your body takes cholesterol and tries to shove it into the open wound. This is why we want your LDL, or bad cholesterol, below 70.   The combination of platelets and cholesterol over 5-10 years forms plaque that can break off and cause a heart attack or stroke.   PLEASE REMEMBER:  Diabetes is preventable! Up to 85 percent of complications and morbidities among individuals with type 2 diabetes can be prevented, delayed, or effectively treated and minimized with regular visits to a health professional, appropriate monitoring and medication, and a healthy diet and lifestyle.  Recommendations For Diabetic/Prediabetic Patients:   -  Take medications as prescribed  -  Recommend Dr Joel Fuhrman's book "The End of Diabetes "  And "The End of Dieting"- Can get at  www.Amazon.com and encourage also get the Audio CD book  - AVOID Animal products, ie. Meat - red/white, Poultry and Dairy/especially cheese - Exercise at least 5 times a week for 30 minutes or preferably daily.  - No Smoking - Drink less than 2 drinks a day.  - Monitor your feet for sores - Have yearly Eye Exams - Recommend annual Flu vaccine  -   Recommend Pneumovax and Prevnar vaccines - Shingles Vaccine (Zostavax) if over 54 y.o.  Goals:   - BMI less than 24 - Fasting sugar less than 130 or less than 150 if tapering medicines to lose weight  - Systolic BP less than 130  - Diastolic BP less than 80 - Bad LDL Cholesterol less than 70 - Triglycerides less than 150   

## 2016-10-31 ENCOUNTER — Encounter: Payer: Self-pay | Admitting: Internal Medicine

## 2016-11-10 ENCOUNTER — Other Ambulatory Visit: Payer: Self-pay

## 2016-11-10 MED ORDER — INSULIN GLARGINE-LIXISENATIDE 100-33 UNT-MCG/ML ~~LOC~~ SOPN: 40.0000 [IU] | PEN_INJECTOR | Freq: Every morning | SUBCUTANEOUS | 4 refills | Status: DC

## 2016-12-05 ENCOUNTER — Other Ambulatory Visit: Payer: Self-pay | Admitting: Internal Medicine

## 2016-12-13 ENCOUNTER — Other Ambulatory Visit: Payer: Self-pay | Admitting: Internal Medicine

## 2016-12-22 ENCOUNTER — Encounter: Payer: Self-pay | Admitting: Internal Medicine

## 2016-12-22 ENCOUNTER — Ambulatory Visit: Payer: BLUE CROSS/BLUE SHIELD | Admitting: *Deleted

## 2016-12-22 VITALS — Ht 74.0 in | Wt 299.6 lb

## 2016-12-22 DIAGNOSIS — Z1211 Encounter for screening for malignant neoplasm of colon: Secondary | ICD-10-CM

## 2016-12-22 MED ORDER — NA SULFATE-K SULFATE-MG SULF 17.5-3.13-1.6 GM/177ML PO SOLN: 1.0000 [IU] | Freq: Once | ORAL | 0 refills | Status: AC

## 2016-12-22 NOTE — Progress Notes (Signed)
No egg or soy allergy known to patient  No issues with past sedation with any surgeries  or procedures, no intubation problems  No diet pills per patient No home 02 use per patient  No blood thinners per patient  Pt denies issues with constipation  No A fib or A flutter  EMMI video sent to pt's e mail  

## 2017-01-05 ENCOUNTER — Encounter: Payer: Self-pay | Admitting: Internal Medicine

## 2017-01-05 ENCOUNTER — Ambulatory Visit (AMBULATORY_SURGERY_CENTER): Payer: BLUE CROSS/BLUE SHIELD | Admitting: Internal Medicine

## 2017-01-05 VITALS — BP 98/74 | HR 79 | Temp 97.8°F | Resp 18 | Ht 73.0 in | Wt 296.0 lb

## 2017-01-05 DIAGNOSIS — D124 Benign neoplasm of descending colon: Secondary | ICD-10-CM | POA: Diagnosis not present

## 2017-01-05 DIAGNOSIS — Z1211 Encounter for screening for malignant neoplasm of colon: Secondary | ICD-10-CM

## 2017-01-05 DIAGNOSIS — D123 Benign neoplasm of transverse colon: Secondary | ICD-10-CM

## 2017-01-05 DIAGNOSIS — D128 Benign neoplasm of rectum: Secondary | ICD-10-CM

## 2017-01-05 LAB — HM COLONOSCOPY

## 2017-01-05 MED ORDER — SODIUM CHLORIDE 0.9 % IV SOLN: 500.0000 mL | INTRAVENOUS | Status: DC

## 2017-01-05 NOTE — Progress Notes (Signed)
Pt's states no medical or surgical changes since previsit or office visit. 

## 2017-01-05 NOTE — Patient Instructions (Signed)
YOU HAD AN ENDOSCOPIC PROCEDURE TODAY AT THE [facility]:   Refer to the procedure report that was given to you for any specific questions about what was found during the examination.  If the procedure report does not answer your questions, please call your gastroenterologist to clarify.  If you requested that your care partner not be given the details of your procedure findings, then the procedure report has been included in a sealed envelope for you to review at your convenience later.  YOU SHOULD EXPECT: Some feelings of bloating in the abdomen. Passage of more gas than usual.  Walking can help get rid of the air that was put into your GI tract during the procedure and reduce the bloating. If you had a lower endoscopy (such as a colonoscopy or flexible sigmoidoscopy) you may notice spotting of blood in your stool or on the toilet paper. If you underwent a bowel prep for your procedure, you may not have a normal bowel movement for a few days.  Please Note:  You might notice some irritation and congestion in your nose or some drainage.  This is from the oxygen used during your procedure.  There is no need for concern and it should clear up in a day or so.  SYMPTOMS TO REPORT IMMEDIATELY:   Following lower endoscopy (colonoscopy or flexible sigmoidoscopy):  Excessive amounts of blood in the stool  Significant tenderness or worsening of abdominal pains  Swelling of the abdomen that is new, acute  Fever of 100F or higher   Following upper endoscopy (EGD)  Vomiting of blood or coffee ground material  New chest pain or pain under the shoulder blades  Painful or persistently difficult swallowing  New shortness of breath  Fever of 100F or higher  Black, tarry-looking stools  For urgent or emergent issues, a gastroenterologist can be reached at any hour by calling [phone].   DIET:  We do recommend a small meal at first, but then you may proceed to your regular diet.  Drink  plenty of fluids but you should avoid alcoholic beverages for 24 hours.  ACTIVITY:  You should plan to take it easy for the rest of today and you should NOT DRIVE or use heavy machinery until tomorrow (because of the sedation medicines used during the test).    FOLLOW UP: Our staff will call the number listed on your records the next business day following your procedure to check on you and address any questions or concerns that you may have regarding the information given to you following your procedure. If we do not reach you, we will leave a message.  However, if you are feeling well and you are not experiencing any problems, there is no need to return our call.  We will assume that you have returned to your regular daily activities without incident.  If any biopsies were taken you will be contacted by phone or by letter within the next 1-3 weeks.  Please call us at [phone] if you have not heard about the biopsies in 3 weeks.    SIGNATURES/CONFIDENTIALITY: You and/or your care partner have signed paperwork which will be entered into your electronic medical record.  These signatures attest to the fact that that the information above on your After Visit Summary has been reviewed and is understood.  Full responsibility of the confidentiality of this discharge information lies with you and/or your care-partner.  Polyp information given. 

## 2017-01-05 NOTE — Progress Notes (Signed)
Called to room to assist during endoscopic procedure.  Patient ID and intended procedure confirmed with present staff. Received instructions for my participation in the procedure from the performing physician.  

## 2017-01-05 NOTE — Progress Notes (Signed)
To recovery, report to RN, VSS. 

## 2017-01-05 NOTE — Op Note (Signed)
[facility] Patient Name: Carlos [name] Procedure Date: 01/05/2017 8:41 AM MRN: [id number] Endoscopist: Jerene Bears , MD Age: 56 Referring MD:  Date of Birth: 03/04/1961 Gender: Male Account #: [id number] Procedure:                Colonoscopy Indications:              Screening for colorectal malignant neoplasm, This                            is the patient's first colonoscopy Medicines:                Monitored Anesthesia Care Procedure:                Pre-Anesthesia Assessment:                           - Prior to the procedure, a History and Physical                            was performed, and patient medications and                            allergies were reviewed. The patient's tolerance of                            previous anesthesia was also reviewed. The risks                            and benefits of the procedure and the sedation                            options and risks were discussed with the patient.                            All questions were answered, and informed consent                            was obtained. Prior Anticoagulants: The patient has                            taken no previous anticoagulant or antiplatelet                            agents. ASA Grade Assessment: II - A patient with                            mild systemic disease. After reviewing the risks                            and benefits, the patient was deemed in                            satisfactory condition to undergo the procedure.  After obtaining informed consent, the colonoscope                            was passed under direct vision. Throughout the                            procedure, the patient's blood pressure, pulse, and                            oxygen saturations were monitored continuously. The                            Colonoscope was introduced through the anus and                            advanced to the the cecum,  identified by                            appendiceal orifice and ileocecal valve. The                            colonoscopy was performed without difficulty. The                            patient tolerated the procedure well. The quality                            of the bowel preparation was good. The ileocecal                            valve, appendiceal orifice, and rectum were                            photographed. Scope In: 8:52:11 AM Scope Out: 9:07:57 AM Scope Withdrawal Time: 0 hours 13 minutes 6 seconds  Total Procedure Duration: 0 hours 15 minutes 46 seconds  Findings:                 The digital rectal exam was normal.                           A 5 mm polyp was found in the transverse colon. The                            polyp was sessile. The polyp was removed with a                            cold snare. Resection and retrieval were complete.                           A 5 mm polyp was found in the descending colon. The                            polyp was sessile. The polyp was  removed with a                            cold snare. Resection and retrieval were complete.                           Two sessile polyps were found in the rectum. The                            polyps were 2 to 5 mm in size. These polyps were                            removed with a cold snare. Resection and retrieval                            were complete.                           The exam was otherwise without abnormality on                            direct and retroflexion views. Complications:            No immediate complications. Estimated Blood Loss:     Estimated blood loss was minimal. Impression:               - One 5 mm polyp in the transverse colon, removed                            with a cold snare. Resected and retrieved.                           - One 5 mm polyp in the descending colon, removed                            with a cold snare. Resected and retrieved.                            - Two 2 to 5 mm polyps in the rectum, removed with                            a cold snare. Resected and retrieved.                           - The examination was otherwise normal on direct                            and retroflexion views. Recommendation:           - Patient has a contact number available for                            emergencies. The signs and symptoms of potential  delayed complications were discussed with the                            patient. Return to normal activities tomorrow.                            Written discharge instructions were provided to the                            patient.                           - Resume previous diet.                           - Continue present medications.                           - Await pathology results.                           - Repeat colonoscopy is recommended for                            surveillance. The colonoscopy date will be                            determined after pathology results from today's                            exam become available for review. Jerene Bears, MD 01/05/2017 9:11:38 AM This report has been signed electronically.

## 2017-01-06 ENCOUNTER — Telehealth: Payer: Self-pay

## 2017-01-06 ENCOUNTER — Telehealth: Payer: Self-pay | Admitting: *Deleted

## 2017-01-06 ENCOUNTER — Encounter: Payer: Self-pay | Admitting: Physician Assistant

## 2017-01-06 NOTE — Telephone Encounter (Signed)
  Follow up Call-  Call back number 01/05/2017  Post procedure Call Back phone  # [phone]  Permission to leave phone message Yes  Some recent data might be hidden     Left message

## 2017-01-06 NOTE — Telephone Encounter (Signed)
  Follow up Call-  Call back number 01/05/2017  Post procedure Call Back phone  # [phone]  Permission to leave phone message Yes  Some recent data might be hidden     No answer at # given.  LM on VM.

## 2017-01-10 ENCOUNTER — Encounter: Payer: Self-pay | Admitting: Internal Medicine

## 2017-03-07 ENCOUNTER — Encounter: Payer: Self-pay | Admitting: Physician Assistant

## 2017-03-22 ENCOUNTER — Other Ambulatory Visit: Payer: Self-pay | Admitting: Internal Medicine

## 2017-04-11 ENCOUNTER — Encounter: Payer: Self-pay | Admitting: Physician Assistant

## 2017-04-11 ENCOUNTER — Ambulatory Visit: Payer: BLUE CROSS/BLUE SHIELD | Admitting: Physician Assistant

## 2017-04-11 VITALS — BP 138/70 | HR 83 | Temp 97.0°F | Resp 14 | Ht 73.0 in | Wt 306.8 lb

## 2017-04-11 DIAGNOSIS — I10 Essential (primary) hypertension: Secondary | ICD-10-CM | POA: Diagnosis not present

## 2017-04-11 DIAGNOSIS — E559 Vitamin D deficiency, unspecified: Secondary | ICD-10-CM | POA: Diagnosis not present

## 2017-04-11 DIAGNOSIS — Z79899 Other long term (current) drug therapy: Secondary | ICD-10-CM

## 2017-04-11 DIAGNOSIS — H6123 Impacted cerumen, bilateral: Secondary | ICD-10-CM

## 2017-04-11 DIAGNOSIS — R6889 Other general symptoms and signs: Secondary | ICD-10-CM | POA: Diagnosis not present

## 2017-04-11 DIAGNOSIS — N521 Erectile dysfunction due to diseases classified elsewhere: Secondary | ICD-10-CM

## 2017-04-11 DIAGNOSIS — Z136 Encounter for screening for cardiovascular disorders: Secondary | ICD-10-CM

## 2017-04-11 DIAGNOSIS — Z87442 Personal history of urinary calculi: Secondary | ICD-10-CM

## 2017-04-11 DIAGNOSIS — E349 Endocrine disorder, unspecified: Secondary | ICD-10-CM | POA: Diagnosis not present

## 2017-04-11 DIAGNOSIS — Z6841 Body Mass Index (BMI) 40.0 and over, adult: Secondary | ICD-10-CM

## 2017-04-11 DIAGNOSIS — D229 Melanocytic nevi, unspecified: Secondary | ICD-10-CM

## 2017-04-11 DIAGNOSIS — E1169 Type 2 diabetes mellitus with other specified complication: Secondary | ICD-10-CM

## 2017-04-11 DIAGNOSIS — E782 Mixed hyperlipidemia: Secondary | ICD-10-CM

## 2017-04-11 DIAGNOSIS — Z9989 Dependence on other enabling machines and devices: Secondary | ICD-10-CM

## 2017-04-11 DIAGNOSIS — H9203 Otalgia, bilateral: Secondary | ICD-10-CM

## 2017-04-11 DIAGNOSIS — Z130 Encounter for screening for diseases of the blood and blood-forming organs and certain disorders involving the immune mechanism: Secondary | ICD-10-CM

## 2017-04-11 DIAGNOSIS — Z0001 Encounter for general adult medical examination with abnormal findings: Secondary | ICD-10-CM

## 2017-04-11 DIAGNOSIS — E1121 Type 2 diabetes mellitus with diabetic nephropathy: Secondary | ICD-10-CM

## 2017-04-11 DIAGNOSIS — G4733 Obstructive sleep apnea (adult) (pediatric): Secondary | ICD-10-CM

## 2017-04-11 NOTE — Progress Notes (Signed)
Complete Physical  Assessment and Plan: Essential hypertension - continue medications, DASH diet, exercise and monitor at home. Call if greater than 130/80.  - CBC with Differential/Platelet - BASIC METABOLIC PANEL WITH GFR - Hepatic function panel - TSH - Urinalysis, Routine w reflex microscopic (not at [facility]) - Microalbumin / creatinine urine ratio - EKG 12-Lead - [place], RETROPERITNL ABD,  LTD   Mixed hyperlipidemia -continue medications, check lipids, decrease fatty foods, increase activity.  - Lipid panel  Type 2 diabetes mellitus with diabetic chronic kidney disease Discussed general issues about diabetes pathophysiology and management., Educational material distributed., Suggested low cholesterol diet., Encouraged aerobic exercise., Discussed foot care., Reminded to get yearly retinal exam. - Hemoglobin A1c  Testosterone deficiency - Testosterone  Vitamin D deficiency - Vit D  25 hydroxy (rtn osteoporosis monitoring)  Medication management - Magnesium  OSA on CPAP Continue CPAP, weight loss advised   Erectile dysfunction due to Diabetes Weight loss advised, decrease sugars   Obesity Obesity with co morbidities- long discussion about weight loss, diet, and exercise   History of nephrolithiasis Continue follow up urology, increase water   Encounter for general adult medical examination with abnormal findings  1 year  Bilateral ear cerumen impaction - stop using Qtips, irrigation used in the office without complications, use OTC drops/oil at home to prevent reoccurence   Discussed med's effects and SE's. Screening labs and tests as requested with regular follow-up as recommended.   HPI 56 y.o. male  presents for a complete physical. His blood pressure has been controlled at home, today their BP is BP: 138/70 He does not workout, does some lawn work. He denies chest pain, shortness of breath, dizziness.  He is not on cholesterol medication and denies myalgias.  His cholesterol is at goal. The cholesterol last visit was:   Lab Results  Component Value Date   CHOL 155 09/19/2016   HDL 43 09/19/2016   LDLCALC 68 09/19/2016   TRIG 221 (H) 09/19/2016   CHOLHDL 3.6 09/19/2016   He has been working on diet and exercise for diabetes with CKD and ED, has viagra, he is on ASA, he is on ACE, he is on Jardiance, soliquia 27 units, MF, has been running well at home in the morning and denies paresthesia of the feet, polydipsia and polyuria. Last A1C in the office was:  Lab Results  Component Value Date   HGBA1C 10.1 (H) 09/19/2016   Lab Results  Component Value Date   GFRAA 58 (L) 09/19/2016   Patient is on Vitamin D supplement, 5000 IU daily Lab Results  Component Value Date   VD25OH 64 08/05/2015   He is has a history of testosterone deficiency and is on testosterone replacement at this time, only on 2 pumps a day He states that the testosterone is help with his energy, libido, muscle mass.  Lab Results  Component Value Date   TESTOSTERONE 1,107 (H) 10/13/2016   Has history of kidney stone, follows with Dr. Louis Meckel. BMI is Body mass index is 40.48 kg/m., he is working on diet and exercise. Has OSA and is on CPAP.  Wt Readings from Last 3 Encounters:  04/11/17 (!) 306 lb 12.8 oz (139.2 kg)  01/05/17 296 lb (134.3 kg)  12/22/16 299 lb 9.6 oz (135.9 kg)    Current Medications:  Current Outpatient Medications on File Prior to Visit  Medication Sig  . aspirin EC 81 MG tablet Take 81 mg by mouth daily.  . Cholecalciferol (VITAMIN D-3) 5000 UNITS  TABS Take 5,000 Units by mouth daily.  Marland Kitchen glucose blood (CONTOUR NEXT TEST) test strip Check blood sugar 1 time a day-[id number]  . Insulin Glargine-Lixisenatide (SOLIQUA) 100-33 UNT-MCG/ML SOPN Inject 40 Units into the skin every morning.  Marland Kitchen JARDIANCE 25 MG TABS tablet TAKE 1 TABLET DAILY  . lisinopril (PRINIVIL,ZESTRIL) 20 MG tablet TAKE 1 TABLET (20 MG TOTAL) BY MOUTH DAILY.  . metFORMIN (GLUCOPHAGE-XR)  500 MG 24 hr tablet TAKE 4 TABLETS (2,000 MG TOTAL) BY MOUTH DAILY.  . sildenafil (VIAGRA) 100 MG tablet TAKE 1/2 TO 1 TABLET ONCE DAILY AS NEEDED  . Testosterone (ANDROGEL) 40.5 MG/2.5GM (1.62%) GEL 2 pumps bilateral thighs or arms (4 pumps total) a day   Current Facility-Administered Medications on File Prior to Visit  Medication  . 0.9 %  sodium chloride infusion    Health Maintenance:  Immunization History  Administered Date(s) Administered  . DTaP 04/02/2012  . Influenza Split 02/17/2014  . Influenza-Unspecified 03/01/2016  . Pneumococcal Polysaccharide-23 04/02/2012   Tetanus: 2013 Pneumovax: 2013 Prevnar 82: due age 63 Flu vaccine: 2018  Zostavax: N/A  DEXA: N/A Colonoscopy: 01/05/2017  EGD: N/A  Eye exam 07/2016, Dr. Sabra Heck  Dentist: Narda Amber smiles Urologist: Dr. Louis Meckel Endocrinologist: Dr. Cruzita Lederer  Medical History:  Past Medical History:  Diagnosis Date  . Erectile dysfunction 09/11/2014  . History of kidney stones   . Hypogonadism male   . Left ureteral stone   . OSA on CPAP   . Right bundle branch block   . Sleep apnea   . Type 2 diabetes mellitus (HCC)    Allergies No Known Allergies  SURGICAL HISTORY He  has a past surgical history that includes Ankle surgery (Left, 2002); Rhinoplasty (2001); Extracorporeal shock wave lithotripsy (Left, 09-25-2014); Cystoscopy/retrograde/ureteroscopy/stone extraction with basket (Left, 12/05/2014); and Cystoscopy w/ retrogrades (Left, 12/05/2014).   FAMILY HISTORY His family history includes Cancer in his mother; Diabetes in his father; Hypertension in his mother.   SOCIAL HISTORY He  reports that  has never smoked. he has never used smokeless tobacco. He reports that he drinks alcohol. He reports that he does not use drugs.   Review of Systems  Constitutional: Positive for malaise/fatigue. Negative for chills, diaphoresis, fever and weight loss.  HENT: Negative.   Eyes: Negative.   Respiratory: Negative.   Negative for shortness of breath.   Cardiovascular: Negative.  Negative for chest pain and claudication.  Gastrointestinal: Negative.  Negative for abdominal pain, blood in stool, constipation, diarrhea and melena.  Genitourinary: Negative.   Musculoskeletal: Negative.   Skin: Negative.   Neurological: Negative.  Negative for weakness.  Psychiatric/Behavioral: Negative.    Physical Exam: Estimated body mass index is 40.48 kg/m as calculated from the following:   Height as of this encounter: 6\' 1"  (1.854 m).   Weight as of this encounter: 306 lb 12.8 oz (139.2 kg). BP 138/70   Pulse 83   Temp (!) 97 F (36.1 C)   Resp 14   Ht 6\' 1"  (1.854 m)   Wt (!) 306 lb 12.8 oz (139.2 kg)   SpO2 97%   BMI 40.48 kg/m  General Appearance: Well nourished, in no apparent distress. Eyes: PERRLA, EOMs, conjunctiva no swelling or erythema, normal fundi and vessels. Sinuses: No Frontal/maxillary tenderness ENT/Mouth: Ext aud canals clear after removal in the office, normal light reflex with TMs without erythema after bilateral ears were cleaned in the office, bulging. Good dentition. No erythema, swelling, or exudate on post pharynx. Tonsils not swollen or erythematous. Hearing  normal.  Neck: Supple, thyroid normal. No bruits Respiratory: Respiratory effort normal, BS equal bilaterally without rales, rhonchi, wheezing or stridor. Cardio: RRR without murmurs, rubs or gallops. Brisk peripheral pulses without edema.  Chest: symmetric, with normal excursions and percussion. Abdomen: Soft, +BS. Non tender, no guarding, rebound, hernias, masses, or organomegaly. .  Lymphatics: Non tender without lymphadenopathy.  Genitourinary: defer urologist Musculoskeletal: Full ROM all peripheral extremities,5/5 strength, and normal gait.Marland Kitchen  Neuro: Cranial nerves intact, reflexes equal bilaterally. Normal muscle tone, no cerebellar symptoms. Sensation intact.  Psych: Awake and oriented X 3, normal affect, Insight and  Judgment appropriate.  Skin: dark nevus left medial foot, will monitor: see below. Warm, dry without rashes, lesions, ecchymosis     EKG: CRBBB, occ PVC, no ST changes AORTA SCAN: defer  Vicie Mutters 4:13 PM

## 2017-04-11 NOTE — Patient Instructions (Signed)
    Bad carbs also include fruit juice, alcohol, and sweet tea. These are empty calories that do not signal to your brain that you are full.   Please remember the good carbs are still carbs which convert into sugar. So please measure them out no more than 1/2-1 cup of rice, oatmeal, pasta, and beans  Veggies are however free foods! Pile them on.   Not all fruit is created equal. Please see the list below, the fruit at the bottom is higher in sugars than the fruit at the top. Please avoid all dried fruits.     If your morning sugar is always below 100 then the issue is with your sugar spiking after meals. Try to take your blood sugar approximately 2 hours after eating, this number should be less than 200. If it is not, think about the foods that you ate and better choices you can make.    Your A1C is a measure of your sugar over the past 3 months and is not affected by what you have eaten over the past few days. Diabetes increases your chances of stroke and heart attack over 300 % and is the leading cause of blindness and kidney failure in the [place]. Please make sure you decrease bad carbs like white bread, white rice, potatoes, corn, soft drinks, pasta, cereals, refined sugars, sweet tea, dried fruits, and fruit juice. Good carbs are okay to eat in moderation like sweet potatoes, brown rice, whole grain pasta/bread, most fruit (except dried fruit) and you can eat as many veggies as you want.   Greater than 6.5 is considered diabetic. Between 6.4 and 5.7 is prediabetic If your A1C is less than 5.7 you are NOT diabetic.  Targets for Glucose Readings: Time of Check Target for patients WITHOUT Diabetes Target for DIABETICS  Before Meals Less than 100  less than 150  Two hours after meals Less than 200  Less than 250

## 2017-04-12 LAB — BASIC METABOLIC PANEL WITH GFR
BUN / CREAT RATIO: 12 (calc) (ref 6–22)
BUN: 18 mg/dL (ref 7–25)
CO2: 27 mmol/L (ref 20–32)
CREATININE: 1.47 mg/dL — AB (ref 0.70–1.33)
Calcium: 9.7 mg/dL (ref 8.6–10.3)
Chloride: 107 mmol/L (ref 98–110)
GFR, Est African American: 62 mL/min/{1.73_m2} (ref >=60)
GFR, Est Non African American: 53 mL/min/{1.73_m2} — ABNORMAL LOW (ref >=60)
GLUCOSE: 134 mg/dL — AB (ref 65–99)
POTASSIUM: 4.1 mmol/L (ref 3.5–5.3)
SODIUM: 141 mmol/L (ref 135–146)

## 2017-04-12 LAB — TSH: TSH: 1.82 m[IU]/L (ref 0.40–4.50)

## 2017-04-12 LAB — HEPATIC FUNCTION PANEL
AG RATIO: 1.7 (calc) (ref 1.0–2.5)
ALKALINE PHOSPHATASE (APISO): 135 U/L — AB (ref 40–115)
ALT: 48 U/L — ABNORMAL HIGH (ref 9–46)
AST: 24 U/L (ref 10–35)
Albumin: 4.3 g/dL (ref 3.6–5.1)
BILIRUBIN INDIRECT: 0.9 mg/dL (ref 0.2–1.2)
BILIRUBIN TOTAL: 1.1 mg/dL (ref 0.2–1.2)
Bilirubin, Direct: 0.2 mg/dL (ref 0.0–0.2)
Globulin: 2.5 g/dL (calc) (ref 1.9–3.7)
TOTAL PROTEIN: 6.8 g/dL (ref 6.1–8.1)

## 2017-04-12 LAB — URINALYSIS, ROUTINE W REFLEX MICROSCOPIC
BACTERIA UA: NONE SEEN /HPF
Bilirubin Urine: NEGATIVE
HGB URINE DIPSTICK: NEGATIVE
Hyaline Cast: NONE SEEN /LPF
KETONES UR: NEGATIVE
LEUKOCYTES UA: NEGATIVE
Nitrite: NEGATIVE
RBC / HPF: NONE SEEN /HPF (ref 0–2)
SQUAMOUS EPITHELIAL / LPF: NONE SEEN /HPF (ref <=5)
Specific Gravity, Urine: 1.026 (ref 1.001–1.03)
WBC UA: NONE SEEN /HPF (ref 0–5)
pH: 5.5 (ref 5.0–8.0)

## 2017-04-12 LAB — CBC WITH DIFFERENTIAL/PLATELET
BASOS PCT: 1.1 %
Basophils Absolute: 59 cells/uL (ref 0–200)
EOS PCT: 3.7 %
Eosinophils Absolute: 200 cells/uL (ref 15–500)
HEMATOCRIT: 47.3 % (ref 38.5–50.0)
HEMOGLOBIN: 16.6 g/dL (ref 13.2–17.1)
LYMPHS ABS: 2300 {cells}/uL (ref 850–3900)
MCH: 29.5 pg (ref 27.0–33.0)
MCHC: 35.1 g/dL (ref 32.0–36.0)
MCV: 84.2 fL (ref 80.0–100.0)
MPV: 12.3 fL (ref 7.5–12.5)
Monocytes Relative: 5.4 %
NEUTROS ABS: 2549 {cells}/uL (ref 1500–7800)
NEUTROS PCT: 47.2 %
Platelets: 204 10*3/uL (ref 140–400)
RBC: 5.62 10*6/uL (ref 4.20–5.80)
RDW: 12.7 % (ref 11.0–15.0)
Total Lymphocyte: 42.6 %
WBC mixed population: 292 cells/uL (ref 200–950)
WBC: 5.4 10*3/uL (ref 3.8–10.8)

## 2017-04-12 LAB — MICROALBUMIN / CREATININE URINE RATIO
CREATININE, URINE: 56 mg/dL (ref 20–320)
MICROALB UR: 18.5 mg/dL
Microalb Creat Ratio: 330 mcg/mg creat — ABNORMAL HIGH (ref <30)

## 2017-04-12 LAB — MAGNESIUM: MAGNESIUM: 2 mg/dL (ref 1.5–2.5)

## 2017-04-12 LAB — LIPID PANEL
Cholesterol: 162 mg/dL (ref <200)
HDL: 58 mg/dL (ref >40)
LDL Cholesterol (Calc): 71 mg/dL (calc)
NON-HDL CHOLESTEROL (CALC): 104 mg/dL (ref <130)
Total CHOL/HDL Ratio: 2.8 (calc) (ref <5.0)
Triglycerides: 249 mg/dL — ABNORMAL HIGH (ref <150)

## 2017-04-12 LAB — HEMOGLOBIN A1C
Hgb A1c MFr Bld: 7.6 % of total Hgb — ABNORMAL HIGH (ref <5.7)
Mean Plasma Glucose: 171 (calc)
eAG (mmol/L): 9.5 (calc)

## 2017-04-12 LAB — IRON, TOTAL/TOTAL IRON BINDING CAP
%SAT: 39 % (ref 15–60)
Iron: 119 ug/dL (ref 50–180)
TIBC: 302 ug/dL (ref 250–425)

## 2017-04-12 LAB — TESTOSTERONE: Testosterone: 104 ng/dL — ABNORMAL LOW (ref 250–827)

## 2017-04-12 LAB — VITAMIN B12: Vitamin B-12: 821 pg/mL (ref 200–1100)

## 2017-04-12 LAB — VITAMIN D 25 HYDROXY (VIT D DEFICIENCY, FRACTURES): Vit D, 25-Hydroxy: 47 ng/mL (ref 30–100)

## 2017-04-17 ENCOUNTER — Other Ambulatory Visit: Payer: Self-pay | Admitting: Internal Medicine

## 2017-04-17 ENCOUNTER — Other Ambulatory Visit: Payer: Self-pay | Admitting: Physician Assistant

## 2017-04-18 NOTE — Telephone Encounter (Signed)
Rx called in 

## 2017-04-27 ENCOUNTER — Other Ambulatory Visit: Payer: Self-pay | Admitting: Physician Assistant

## 2017-05-23 ENCOUNTER — Other Ambulatory Visit: Payer: Self-pay

## 2017-05-23 ENCOUNTER — Other Ambulatory Visit: Payer: Self-pay | Admitting: Internal Medicine

## 2017-05-23 DIAGNOSIS — E1122 Type 2 diabetes mellitus with diabetic chronic kidney disease: Secondary | ICD-10-CM

## 2017-05-23 DIAGNOSIS — N183 Chronic kidney disease, stage 3 unspecified: Secondary | ICD-10-CM

## 2017-05-23 MED ORDER — INSULIN GLARGINE-LIXISENATIDE 100-33 UNT-MCG/ML ~~LOC~~ SOPN: 40.0000 [IU] | PEN_INJECTOR | Freq: Every morning | SUBCUTANEOUS | 4 refills | Status: DC

## 2017-07-12 ENCOUNTER — Ambulatory Visit: Payer: Self-pay | Admitting: Physician Assistant

## 2017-07-16 NOTE — Progress Notes (Signed)
Assessment and Plan:  Hypertension:  -well controlled -Continue medication -monitor blood pressure at home. -Continue DASH diet -Reminder to go to the ER if any CP, SOB, nausea, dizziness, severe HA, changes vision/speech, left arm numbness and tingling and jaw pain.  Cholesterol - Continue diet and exercise -Check cholesterol. -likely need medication  Diabetes with diabetic chronic kidney disease  -Continue diet and exercise.  -Check A1C - continue soliqua, metformin, jardiance  Vitamin D Def -continue medications.   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 Further disposition pending results of labs. Discussed med's effects and SE's.   Future Appointments  Date Time Provider [place]  04/30/2018  3:00 PM Vicie Mutters, PA-C [id number] None    HPI 57 y.o. male  presents for 3 month follow up with hypertension, hyperlipidemia, diabetes and vitamin D deficiency.   His blood pressure has been controlled at home, today their BP is BP: 128/72.He does workout. He denies chest pain, shortness of breath, dizziness.   He is on cholesterol medication and denies myalgias. His cholesterol is at goal. The cholesterol was:  04/11/2017: Cholesterol 162; HDL 58; LDL Cholesterol (Calc) 71; Triglycerides 249   He has been working on diet and exercise for diabetes with diabetic chronic kidney disease, he is on bASA, he is on ACE/ARB, fasting sugars have been 120-130's, using freestyle  and denies  foot ulcerations, hyperglycemia, hypoglycemia , increased appetite, nausea, paresthesia of the feet, polydipsia, polyuria, visual disturbances, vomiting and weight loss. Soliqua, jardiance, and meformin.  Last A1C was: 04/11/2017: Hgb A1c MFr Bld 7.6.    He is still following with Dr. Marin Olp for his hemachromatosis.  He did recently have some phlebotomy.  They still believe a large portion of this is due to testosterone use.     BMI is Body mass index is 40.58 kg/m., he is working on diet and exercise. He is on a CPAP for OSA but states he has not been sleeping well, has been more stressful at work, more projects.  He normally eats 3 meals a day, often times two.  Wt Readings from Last 3 Encounters:  07/18/17 (!) 307 lb 9.6 oz (139.5 kg)  04/11/17 (!) 306 lb 12.8 oz (139.2 kg)  01/05/17 296 lb (134.3 kg)   Patient is on Vitamin D supplement. 04/11/2017: Vit D, 25-Hydroxy 47    Current Medications:  Current Outpatient Medications on File Prior to Visit  Medication Sig Dispense Refill  . aspirin EC 81 MG tablet Take 81 mg by mouth daily.    . Cholecalciferol (VITAMIN D-3) 5000 UNITS TABS Take 5,000 Units by mouth daily.    Marland Kitchen glucose blood (CONTOUR NEXT TEST) test strip Check blood sugar 1 time a day-DX-E11.29 100 each 1  . Insulin Glargine-Lixisenatide (SOLIQUA) 100-33 UNT-MCG/ML SOPN Inject 40 Units into the skin every morning. 3 mL 4  . JARDIANCE 25 MG TABS tablet TAKE 1 TABLET DAILY 90 tablet 1  . lisinopril (PRINIVIL,ZESTRIL) 20 MG tablet TAKE 1 TABLET (20 MG TOTAL) BY MOUTH DAILY. 90 tablet 1  . metFORMIN (GLUCOPHAGE-XR) 500 MG 24 hr tablet TAKE 4 TABLETS (2,000 MG TOTAL) BY MOUTH DAILY. 360 tablet 0  . sildenafil (VIAGRA) 100 MG tablet TAKE 1/2 TO 1 TABLET ONCE DAILY AS NEEDED 18 tablet 1  . Testosterone 20.25 MG/ACT (1.62%) GEL 2 PUMP BILATERAL SIDES DAILY 150 g 2   Current Facility-Administered Medications on File Prior to Visit  Medication Dose Route Frequency Provider Last  Rate Last Dose  . 0.9 %  sodium chloride infusion  500 mL Intravenous Continuous Pyrtle, Lajuan Lines, MD       Medical History:  Past Medical History:  Diagnosis Date  . Erectile dysfunction 09/11/2014  . History of kidney stones   . Hypogonadism male   . Left ureteral stone   . OSA on CPAP   . Right bundle branch block   . Sleep apnea   . Type 2 diabetes mellitus (HCC)    Allergies: No Known Allergies   Review of Systems:   Review of Systems  Constitutional: Negative for chills, fever and malaise/fatigue.  HENT: Negative for congestion, ear pain and sore throat.   Eyes: Negative.   Respiratory: Negative for cough, shortness of breath and wheezing.   Cardiovascular: Negative for chest pain, palpitations and leg swelling.  Gastrointestinal: Negative for abdominal pain, blood in stool, constipation, diarrhea, heartburn and melena.  Genitourinary: Negative.   Skin: Negative.   Neurological: Negative for dizziness, sensory change, loss of consciousness and headaches.  Psychiatric/Behavioral: Negative for depression. The patient is not nervous/anxious and does not have insomnia.     Family history- Review and unchanged  Social history- Review and unchanged  Physical Exam: BP 128/72   Pulse 84   Temp (!) 97.5 F (36.4 C)   Ht 6\' 1"  (1.854 m)   Wt (!) 307 lb 9.6 oz (139.5 kg)   SpO2 97%   BMI 40.58 kg/m  Wt Readings from Last 3 Encounters:  07/18/17 (!) 307 lb 9.6 oz (139.5 kg)  04/11/17 (!) 306 lb 12.8 oz (139.2 kg)  01/05/17 296 lb (134.3 kg)   General Appearance: Well nourished well developed, non-toxic appearing, in no apparent distress. Eyes: PERRLA, EOMs, conjunctiva no swelling or erythema ENT/Mouth: Ear canals clear with no erythema, swelling, or discharge.  TMs normal bilaterally, oropharynx clear, moist, with no exudate.   Neck: Supple, thyroid normal, no JVD, no cervical adenopathy.  Respiratory: Respiratory effort normal, breath sounds clear A&P, no wheeze, rhonchi or rales noted.  No retractions, no accessory muscle usage Cardio: RRR with no MRGs. No noted edema.  Abdomen: Soft, + BS.  Non tender, no guarding, rebound, hernias, masses. Musculoskeletal: Full ROM, 5/5 strength, Normal gait Skin: Warm, dry without rashes, lesions, ecchymosis.  Neuro: Awake and oriented X 3, Cranial nerves intact. No cerebellar symptoms.  Psych: normal affect, Insight and Judgment appropriate.    Vicie Mutters, PA-C 4:30 PM [facility]

## 2017-07-18 ENCOUNTER — Ambulatory Visit: Payer: BLUE CROSS/BLUE SHIELD | Admitting: Physician Assistant

## 2017-07-18 ENCOUNTER — Encounter: Payer: Self-pay | Admitting: Physician Assistant

## 2017-07-18 VITALS — BP 128/72 | HR 84 | Temp 97.5°F | Ht 73.0 in | Wt 307.6 lb

## 2017-07-18 DIAGNOSIS — I10 Essential (primary) hypertension: Secondary | ICD-10-CM | POA: Diagnosis not present

## 2017-07-18 DIAGNOSIS — Z79899 Other long term (current) drug therapy: Secondary | ICD-10-CM

## 2017-07-18 DIAGNOSIS — E782 Mixed hyperlipidemia: Secondary | ICD-10-CM | POA: Diagnosis not present

## 2017-07-18 DIAGNOSIS — N521 Erectile dysfunction due to diseases classified elsewhere: Secondary | ICD-10-CM

## 2017-07-18 DIAGNOSIS — E1121 Type 2 diabetes mellitus with diabetic nephropathy: Secondary | ICD-10-CM | POA: Diagnosis not present

## 2017-07-18 DIAGNOSIS — E1169 Type 2 diabetes mellitus with other specified complication: Secondary | ICD-10-CM | POA: Diagnosis not present

## 2017-07-18 NOTE — Patient Instructions (Addendum)
Try the melatonin 5mg -20mg  dissolvable or gummy 30 mins before bed  11 Tips to Follow:  1. No caffeine after 3pm: Avoid beverages with caffeine (soda, tea, energy drinks, etc.) especially after 3pm. 2. Don't go to bed hungry: Have your evening meal at least 3 hrs. before going to sleep. It's fine to have a small bedtime snack such as a glass of milk and a few crackers but don't have a big meal. 3. Have a nightly routine before bed: Plan on "winding down" before you go to sleep. Begin relaxing about 1 hour before you go to bed. Try doing a quiet activity such as listening to calming music, reading a book or meditating. 4. Turn off the TV and ALL electronics including video games, tablets, laptops, etc. 1 hour before sleep, and keep them out of the bedroom. 5. Turn off your cell phone and all notifications (new email and text alerts) or even better, leave your phone outside your room while you sleep. Studies have shown that a part of your brain continues to respond to certain lights and sounds even while you're still asleep. 6. Make your bedroom quiet, dark and cool. If you can't control the noise, try wearing earplugs or using a fan to block out other sounds. 7. Practice relaxation techniques. Try reading a book or meditating or drain your brain by writing a list of what you need to do the next day. 8. Don't nap unless you feel sick: you'll have a better night's sleep. 9. Don't smoke, or quit if you do. Nicotine, alcohol, and marijuana can all keep you awake. Talk to your health care provider if you need help with substance use. 10. Most importantly, wake up at the same time every day (or within 1 hour of your usual wake up time) EVEN on the weekends. A regular wake up time promotes sleep hygiene and prevents sleep problems. 11. Reduce exposure to bright light in the last three hours of the day before going to sleep. Maintaining good sleep hygiene and having good sleep habits lower your risk of  developing sleep problems. Getting better sleep can also improve your concentration and alertness. Try the simple steps in this guide. If you still have trouble getting enough rest, make an appointment with your health care provider.  Intermittent fasting is more about strategy than starvation. It's meant to reset your body in different ways, hopefully with fitness and nutrition changes as a result.  Like any big switchover, though, results may vary when it comes down to the individual level. What works for your friends may not work for you, or vice versa. That's why it's helpful to play around with variations on intermittent fasting and healthy habits and find what works best for you.  WHAT IS INTERMITTENT FASTING AND WHY DO IT?  Intermittent fasting doesn't involve specific foods, but rather, a strict schedule regarding when you eat. Also called "time-restricted eating," the tactic has been praised for its contribution to weight loss, improved body composition, and decreased cravings. Preliminary research also suggests it may be beneficial for glucose tolerance, hormone regulation, better muscle mass and lower body fat.  Part of its appeal is the simplicity of the effort. Unlike some other trends, there's no calculations to intermittent fasting.  You simply eat within a certain block of time, usually a window of 8-10 hours. In the other big block of time - about 14-16 hours, including when you're asleep - you don't eat anything, not even snacks. You can drink  water, coffee, tea or any other beverage that doesn't have calories.  For example, if you like having a late dinner, you might skip breakfast and have your first meal at noon and your last meal of the day at 8 p.m., and then not eat until noon again the next day.  IDEAS FOR GETTING STARTED  If you're new to the strategy, it may be helpful to eat within the typical circadian rhythm and keep eating within daylight hours. This can be especially  beneficial if you're looking at intermittent fasting for weight-loss goals.  So first try only eating between 12pm to 8pm.  Outside of this time you may have water, black coffee, and hot tea. You may not eat it drink anything that has carbs, sugars, OR artificial sugars like diet soda.   Like any major eating and fitness shift, it can take time to find the perfect fit, so don't be afraid to experiment with different options - including ditching intermittent fasting altogether if it's simply not for you. But if it is, you may be surprised by some of the benefits that come along with the strategy.  Diet soda leads to weight gain.  We recently discovered that the artifical sugar in the soda stops an enzyme in your stomach that is suppose to signal that your brain is full. So patients that drink a lot of diet soda will never feel full and tend to over eat. So please cut back on diet soda and it can help with your weight loss.   Being dehydrated can hurt your kidneys, cause fatigue, headaches, muscle aches, joint pain, and dry skin/nails so please increase your fluids.   Drink 80-100 oz a day of water, measure it out! This can help with weight loss  Can check out plantnanny app on your phone to help you keep track of your water   Dining out: help on how to choose  It is better if you can meal plan and eat at your house but sometimes life happens so here are some tips to help you make healthier choices while eating out:  1) Ask for your server to pack up 1/2 of your meal to take home for lunch or later. Portion sizes are huge so if you don't have all of it in front of you, you are less likely to eat it all.    2) Ask if they have a smaller portion or lunch portion available, this is often cheaper as well!  3) Ask to not have the bread basket brought to the table  4) Ask for the dressing on the side.   5) Look at the nutrition menu BEFORE you go to a restaurant or fast food chain and have what  you will eat in mind. You can also look it up on food tracking ups like Myfitness Pal or Lost it. However the web site for the restaurant is usually the best bet.   6) Don't forget that you are the costumer, it is okay to ask them to leave things out, ask about substituting, or ask them to cook with less oil!  Here is some more general information for you!

## 2017-07-19 LAB — LIPID PANEL
Cholesterol: 151 mg/dL (ref <200)
HDL: 46 mg/dL (ref >40)
LDL Cholesterol (Calc): 72 mg/dL (calc)
Non-HDL Cholesterol (Calc): 105 mg/dL (calc) (ref <130)
Total CHOL/HDL Ratio: 3.3 (calc) (ref <5.0)
Triglycerides: 250 mg/dL — ABNORMAL HIGH (ref <150)

## 2017-07-19 LAB — CBC WITH DIFFERENTIAL/PLATELET
Basophils Absolute: 53 cells/uL (ref 0–200)
Basophils Relative: 1 %
Eosinophils Absolute: 223 cells/uL (ref 15–500)
Eosinophils Relative: 4.2 %
HEMATOCRIT: 48.4 % (ref 38.5–50.0)
Hemoglobin: 16.9 g/dL (ref 13.2–17.1)
LYMPHS ABS: 2565 {cells}/uL (ref 850–3900)
MCH: 29 pg (ref 27.0–33.0)
MCHC: 34.9 g/dL (ref 32.0–36.0)
MCV: 83.2 fL (ref 80.0–100.0)
MPV: 11.6 fL (ref 7.5–12.5)
Monocytes Relative: 5.7 %
NEUTROS ABS: 2157 {cells}/uL (ref 1500–7800)
Neutrophils Relative %: 40.7 %
Platelets: 238 10*3/uL (ref 140–400)
RBC: 5.82 10*6/uL — AB (ref 4.20–5.80)
RDW: 12.6 % (ref 11.0–15.0)
Total Lymphocyte: 48.4 %
WBC: 5.3 10*3/uL (ref 3.8–10.8)
WBCMIX: 302 {cells}/uL (ref 200–950)

## 2017-07-19 LAB — HEPATIC FUNCTION PANEL
AG Ratio: 1.5 (calc) (ref 1.0–2.5)
ALKALINE PHOSPHATASE (APISO): 138 U/L — AB (ref 40–115)
ALT: 35 U/L (ref 9–46)
AST: 21 U/L (ref 10–35)
Albumin: 4.3 g/dL (ref 3.6–5.1)
BILIRUBIN INDIRECT: 0.7 mg/dL (ref 0.2–1.2)
Bilirubin, Direct: 0.1 mg/dL (ref 0.0–0.2)
GLOBULIN: 2.8 g/dL (ref 1.9–3.7)
TOTAL PROTEIN: 7.1 g/dL (ref 6.1–8.1)
Total Bilirubin: 0.8 mg/dL (ref 0.2–1.2)

## 2017-07-19 LAB — BASIC METABOLIC PANEL WITH GFR
BUN / CREAT RATIO: 13 (calc) (ref 6–22)
BUN: 20 mg/dL (ref 7–25)
CHLORIDE: 105 mmol/L (ref 98–110)
CO2: 30 mmol/L (ref 20–32)
Calcium: 10.1 mg/dL (ref 8.6–10.3)
Creat: 1.55 mg/dL — ABNORMAL HIGH (ref 0.70–1.33)
GFR, Est African American: 58 mL/min/{1.73_m2} — ABNORMAL LOW (ref >=60)
GFR, Est Non African American: 50 mL/min/{1.73_m2} — ABNORMAL LOW (ref >=60)
GLUCOSE: 162 mg/dL — AB (ref 65–99)
POTASSIUM: 4 mmol/L (ref 3.5–5.3)
SODIUM: 144 mmol/L (ref 135–146)

## 2017-07-19 LAB — HEMOGLOBIN A1C
EAG (MMOL/L): 10.6 (calc)
HEMOGLOBIN A1C: 8.3 %{Hb} — AB (ref <5.7)
MEAN PLASMA GLUCOSE: 192 (calc)

## 2017-07-19 LAB — TSH: TSH: 0.95 m[IU]/L (ref 0.40–4.50)

## 2017-07-20 ENCOUNTER — Other Ambulatory Visit: Payer: Self-pay | Admitting: Physician Assistant

## 2017-08-04 ENCOUNTER — Other Ambulatory Visit: Payer: Self-pay | Admitting: Physician Assistant

## 2017-08-07 ENCOUNTER — Other Ambulatory Visit: Payer: Self-pay

## 2017-08-07 MED ORDER — INSULIN GLARGINE-LIXISENATIDE 100-33 UNT-MCG/ML ~~LOC~~ SOPN: 40.0000 mL | PEN_INJECTOR | Freq: Every day | SUBCUTANEOUS | 2 refills | Status: DC

## 2017-08-16 LAB — HM DIABETES EYE EXAM

## 2017-08-29 ENCOUNTER — Encounter: Payer: Self-pay | Admitting: *Deleted

## 2017-09-22 ENCOUNTER — Other Ambulatory Visit: Payer: Self-pay | Admitting: Internal Medicine

## 2017-09-28 ENCOUNTER — Other Ambulatory Visit: Payer: Self-pay | Admitting: *Deleted

## 2017-09-28 MED ORDER — SILDENAFIL CITRATE 100 MG PO TABS: ORAL_TABLET | ORAL | 11 refills | Status: DC

## 2017-10-13 ENCOUNTER — Other Ambulatory Visit: Payer: Self-pay | Admitting: Physician Assistant

## 2017-10-23 ENCOUNTER — Ambulatory Visit: Payer: Self-pay | Admitting: Adult Health

## 2017-10-23 ENCOUNTER — Other Ambulatory Visit: Payer: Self-pay | Admitting: Physician Assistant

## 2017-11-12 NOTE — Progress Notes (Signed)
FOLLOW UP  Assessment and Plan:   Hypertension Well controlled with current medications  Monitor blood pressure at home; patient to call if consistently greater than 130/80 Continue DASH diet.   Reminder to go to the ER if any CP, SOB, nausea, dizziness, severe HA, changes vision/speech, left arm numbness and tingling and jaw pain.  Cholesterol Currently at LDL goal; trigs remain elevated - consider adding fenofibrate Continue low cholesterol diet and exercise.  Check lipid panel.   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  Perform daily foot/skin check, notify office of any concerning changes.  Check A1C  Morbid obesity Long discussion about weight loss, diet, and exercise, risks of chronic obesity Recommended diet heavy in fruits and veggies and low in animal meats, cheeses, and dairy products, appropriate calorie intake Discussed ideal weight for height, he does not want to set a weight goal Patient will work on continuing with portion control, not currently willing to aggressively address Will follow up in 3 months  Vitamin D Def At goal at last visit;  Defer Vit D level  Continue diet and meds as discussed. Further disposition pending results of labs. Discussed med's effects and SE's.   Over 30 minutes of exam, counseling, chart review, and critical decision making was performed.   Future Appointments  Date Time Provider [place]  04/30/2018  3:00 PM Vicie Mutters, PA-C [id number] None    ----------------------------------------------------------------------------------------------------------------------  HPI 57 y.o. male  presents for 3 month follow up on hypertension, cholesterol, diabetes, morbid obeisty and vitamin D deficiency. He is followed by Dr. Marin Olp for hemochromatosis and gets phlebotomies PRN.   BMI is Body  mass index is 40.11 kg/m., he has been working on diet and exercise, has been eating more fruit, limiting portions. He does 1/2 and 1/2 tea, ice water, sugar free orange, stopped soda. Watching portions closely.  Wt Readings from Last 3 Encounters:  11/13/17 (!) 304 lb (137.9 kg)  07/18/17 (!) 307 lb 9.6 oz (139.5 kg)  04/11/17 (!) 306 lb 12.8 oz (139.2 kg)   His blood pressure has been controlled at home, today their BP is BP: 128/72  He does not workout, but bought a road bike and plans to restart this soon. He denies chest pain, shortness of breath, dizziness.   He is not on cholesterol medication and denies myalgias. His LDL cholesterol is at goal, trigs remain elevated. The cholesterol last visit was:   Lab Results  Component Value Date   CHOL 151 07/18/2017   HDL 46 07/18/2017   LDLCALC 72 07/18/2017   TRIG 250 (H) 07/18/2017   CHOLHDL 3.3 07/18/2017    He has been working on diet and exercise for T2 diabetes (on metformin, jardiance, soliqua 40 units daily), and denies foot ulcerations, increased appetite, nausea, paresthesia of the feet, polydipsia, polyuria, visual disturbances, vomiting and weight loss. He does check some sporadic fasting sugars, 120-140. Last A1C in the office was:  Lab Results  Component Value Date   HGBA1C 8.3 (H) 07/18/2017   Patient is on Vitamin D supplement.   Lab Results  Component Value Date   VD25OH 47 04/11/2017     He has a history of testosterone deficiency and is on testosterone replacement. He states that the testosterone helps with his energy, libido, muscle mass. Lab Results  Component Value Date   TESTOSTERONE 104 (L) 04/11/2017      Current Medications:  Current Outpatient Medications on File Prior to Visit  Medication Sig  . aspirin EC 81 MG tablet Take 81 mg by mouth daily.  . Cholecalciferol (VITAMIN D-3) 5000 UNITS TABS Take 5,000 Units by mouth daily.  Marland Kitchen glucose blood (CONTOUR NEXT TEST) test strip Check blood sugar 1 time a  day-DX-E11.29  . JARDIANCE 25 MG TABS tablet TAKE 1 TABLET DAILY  . lisinopril (PRINIVIL,ZESTRIL) 20 MG tablet TAKE 1 TABLET BY MOUTH EVERY DAY  . metFORMIN (GLUCOPHAGE-XR) 500 MG 24 hr tablet TAKE 4 TABLETS (2,000 MG TOTAL) BY MOUTH DAILY.  . sildenafil (VIAGRA) 100 MG tablet TAKE 1/2 TO 1 TABLET ONCE DAILY AS NEEDED  . SOLIQUA 100-33 UNT-MCG/ML SOPN INJECT 40 UNITS UNDER THE SKIN DAILY  . Testosterone 20.25 MG/ACT (1.62%) GEL APPLY 2 PUMPS TO BILATERAL SIDES DAILY   Current Facility-Administered Medications on File Prior to Visit  Medication  . 0.9 %  sodium chloride infusion     Allergies: No Known Allergies   Medical History:  Past Medical History:  Diagnosis Date  . Erectile dysfunction 09/11/2014  . History of kidney stones   . Hypogonadism male   . Left ureteral stone   . OSA on CPAP   . Right bundle branch block   . Sleep apnea   . Type 2 diabetes mellitus (HCC)    Family history- Reviewed and unchanged Social history- Reviewed and unchanged   Review of Systems:  Review of Systems  Constitutional: Negative for malaise/fatigue and weight loss.  HENT: Negative for hearing loss and tinnitus.   Eyes: Negative for blurred vision and double vision.  Respiratory: Negative for cough, shortness of breath and wheezing.   Cardiovascular: Negative for chest pain, palpitations, orthopnea, claudication and leg swelling.  Gastrointestinal: Negative for abdominal pain, blood in stool, constipation, diarrhea, heartburn, melena, nausea and vomiting.  Genitourinary: Negative.   Musculoskeletal: Negative for joint pain and myalgias.  Skin: Negative for rash.  Neurological: Negative for dizziness, tingling, sensory change, weakness and headaches.  Endo/Heme/Allergies: Negative for polydipsia.  Psychiatric/Behavioral: Negative.   All other systems reviewed and are negative.     Physical Exam: BP 128/72   Pulse 80   Temp (!) 97.5 F (36.4 C)   Ht 6\' 1"  (1.854 m)   Wt (!) 304  lb (137.9 kg)   SpO2 97%   BMI 40.11 kg/m  Wt Readings from Last 3 Encounters:  11/13/17 (!) 304 lb (137.9 kg)  07/18/17 (!) 307 lb 9.6 oz (139.5 kg)  04/11/17 (!) 306 lb 12.8 oz (139.2 kg)   General Appearance: Well nourished, in no apparent distress. Eyes: PERRLA, EOMs, conjunctiva no swelling or erythema Sinuses: No Frontal/maxillary tenderness ENT/Mouth: Ext aud canals clear, TMs without erythema, bulging. No erythema, swelling, or exudate on post pharynx.  Tonsils not swollen or erythematous. Hearing normal.  Neck: Supple, thyroid normal.  Respiratory: Respiratory effort normal, BS equal bilaterally without rales, rhonchi, wheezing or stridor.  Cardio: RRR with no MRGs. Brisk peripheral pulses without edema.  Abdomen: Soft, + BS.  Non tender, no guarding, rebound, hernias, masses. Lymphatics: Non tender without lymphadenopathy.  Musculoskeletal: Full ROM, 5/5 strength, Normal gait Skin: Warm, dry without rashes, lesions, ecchymosis.  Neuro: Cranial nerves intact. No cerebellar symptoms.  Psych: Awake and oriented X 3, normal affect, Insight and Judgment appropriate.    Carlos Ribas, NP 4:54 PM [facility]

## 2017-11-13 ENCOUNTER — Ambulatory Visit: Payer: BLUE CROSS/BLUE SHIELD | Admitting: Adult Health

## 2017-11-13 ENCOUNTER — Encounter: Payer: Self-pay | Admitting: Adult Health

## 2017-11-13 VITALS — BP 128/72 | HR 80 | Temp 97.5°F | Ht 73.0 in | Wt 304.0 lb

## 2017-11-13 DIAGNOSIS — I10 Essential (primary) hypertension: Secondary | ICD-10-CM

## 2017-11-13 DIAGNOSIS — E782 Mixed hyperlipidemia: Secondary | ICD-10-CM | POA: Diagnosis not present

## 2017-11-13 DIAGNOSIS — E349 Endocrine disorder, unspecified: Secondary | ICD-10-CM

## 2017-11-13 DIAGNOSIS — E559 Vitamin D deficiency, unspecified: Secondary | ICD-10-CM

## 2017-11-13 DIAGNOSIS — E1121 Type 2 diabetes mellitus with diabetic nephropathy: Secondary | ICD-10-CM

## 2017-11-13 DIAGNOSIS — Z79899 Other long term (current) drug therapy: Secondary | ICD-10-CM | POA: Diagnosis not present

## 2017-11-13 NOTE — Patient Instructions (Signed)
Aim for 7+ servings of fruits and vegetables daily  80+ fluid ounces of water or unsweet tea for healthy kidneys  Limit alcohol intake, avoid smoking  Limit animal fats in diet for cholesterol and heart health - choose grass fed whenever available  Aim for low stress - take time to unwind and care for your mental health  Aim for 150 min of moderate intensity exercise weekly for heart health, and weights twice weekly for bone health  Aim for 7-9 hours of sleep daily      When it comes to diets, agreement about the perfect plan isn't easy to find, even among the experts. Experts at the [employer] developed an idea known as the Healthy Eating Plate. Just imagine a plate divided into logical, healthy portions.  The emphasis is on diet quality:  Load up on vegetables and fruits - one-half of your plate: Aim for color and variety, and remember that potatoes don't count.  Go for whole grains - one-quarter of your plate: Whole wheat, barley, wheat berries, quinoa, oats, brown rice, and foods made with them. If you want pasta, go with whole wheat pasta.  Protein power - one-quarter of your plate: Fish, chicken, beans, and nuts are all healthy, versatile protein sources. Limit red meat.  The diet, however, does go beyond the plate, offering a few other suggestions.  Use healthy plant oils, such as olive, canola, soy, corn, sunflower and peanut. Check the labels, and avoid partially hydrogenated oil, which have unhealthy trans fats.  If you're thirsty, drink water. Coffee and tea are good in moderation, but skip sugary drinks and limit milk and dairy products to one or two daily servings.  The type of carbohydrate in the diet is more important than the amount. Some sources of carbohydrates, such as vegetables, fruits, whole grains, and beans-are healthier than others.  Finally, stay active.   

## 2017-11-14 LAB — LIPID PANEL
Cholesterol: 153 mg/dL (ref <200)
HDL: 45 mg/dL (ref >40)
LDL Cholesterol (Calc): 80 mg/dL (calc)
NON-HDL CHOLESTEROL (CALC): 108 mg/dL (ref <130)
TRIGLYCERIDES: 181 mg/dL — AB (ref <150)
Total CHOL/HDL Ratio: 3.4 (calc) (ref <5.0)

## 2017-11-14 LAB — CBC WITH DIFFERENTIAL/PLATELET
BASOS ABS: 62 {cells}/uL (ref 0–200)
Basophils Relative: 1.2 %
EOS ABS: 182 {cells}/uL (ref 15–500)
Eosinophils Relative: 3.5 %
HCT: 49.4 % (ref 38.5–50.0)
Hemoglobin: 16.9 g/dL (ref 13.2–17.1)
Lymphs Abs: 2012 cells/uL (ref 850–3900)
MCH: 29 pg (ref 27.0–33.0)
MCHC: 34.2 g/dL (ref 32.0–36.0)
MCV: 84.9 fL (ref 80.0–100.0)
MONOS PCT: 6.5 %
MPV: 11.6 fL (ref 7.5–12.5)
NEUTROS PCT: 50.1 %
Neutro Abs: 2605 cells/uL (ref 1500–7800)
PLATELETS: 217 10*3/uL (ref 140–400)
RBC: 5.82 10*6/uL — ABNORMAL HIGH (ref 4.20–5.80)
RDW: 12.7 % (ref 11.0–15.0)
TOTAL LYMPHOCYTE: 38.7 %
WBC mixed population: 338 cells/uL (ref 200–950)
WBC: 5.2 10*3/uL (ref 3.8–10.8)

## 2017-11-14 LAB — COMPLETE METABOLIC PANEL WITH GFR
AG RATIO: 1.8 (calc) (ref 1.0–2.5)
ALKALINE PHOSPHATASE (APISO): 107 U/L (ref 40–115)
ALT: 31 U/L (ref 9–46)
AST: 17 U/L (ref 10–35)
Albumin: 4.4 g/dL (ref 3.6–5.1)
BILIRUBIN TOTAL: 0.9 mg/dL (ref 0.2–1.2)
BUN / CREAT RATIO: 13 (calc) (ref 6–22)
BUN: 18 mg/dL (ref 7–25)
CHLORIDE: 108 mmol/L (ref 98–110)
CO2: 27 mmol/L (ref 20–32)
Calcium: 10 mg/dL (ref 8.6–10.3)
Creat: 1.42 mg/dL — ABNORMAL HIGH (ref 0.70–1.33)
GFR, EST AFRICAN AMERICAN: 64 mL/min/{1.73_m2} (ref >=60)
GFR, Est Non African American: 56 mL/min/{1.73_m2} — ABNORMAL LOW (ref >=60)
GLOBULIN: 2.5 g/dL (ref 1.9–3.7)
GLUCOSE: 130 mg/dL — AB (ref 65–99)
POTASSIUM: 4.5 mmol/L (ref 3.5–5.3)
SODIUM: 143 mmol/L (ref 135–146)
TOTAL PROTEIN: 6.9 g/dL (ref 6.1–8.1)

## 2017-11-14 LAB — HEMOGLOBIN A1C
HEMOGLOBIN A1C: 7.5 %{Hb} — AB (ref <5.7)
MEAN PLASMA GLUCOSE: 169 (calc)
eAG (mmol/L): 9.3 (calc)

## 2017-11-14 LAB — TSH: TSH: 0.81 m[IU]/L (ref 0.40–4.50)

## 2017-11-14 LAB — MAGNESIUM: MAGNESIUM: 2.2 mg/dL (ref 1.5–2.5)

## 2017-11-23 ENCOUNTER — Other Ambulatory Visit: Payer: Self-pay | Admitting: Internal Medicine

## 2017-12-29 ENCOUNTER — Encounter: Payer: Self-pay | Admitting: Hematology & Oncology

## 2018-02-21 ENCOUNTER — Other Ambulatory Visit: Payer: Self-pay | Admitting: Adult Health

## 2018-03-09 ENCOUNTER — Other Ambulatory Visit: Payer: Self-pay

## 2018-03-09 DIAGNOSIS — E349 Endocrine disorder, unspecified: Secondary | ICD-10-CM

## 2018-03-09 DIAGNOSIS — N183 Chronic kidney disease, stage 3 (moderate): Principal | ICD-10-CM

## 2018-03-09 DIAGNOSIS — E1122 Type 2 diabetes mellitus with diabetic chronic kidney disease: Secondary | ICD-10-CM

## 2018-03-09 MED ORDER — LISINOPRIL 20 MG PO TABS: 20.0000 mg | ORAL_TABLET | Freq: Every day | ORAL | 1 refills | Status: DC

## 2018-03-09 MED ORDER — EMPAGLIFLOZIN 25 MG PO TABS: 25.0000 mg | ORAL_TABLET | Freq: Every day | ORAL | 1 refills | Status: DC

## 2018-03-09 MED ORDER — TESTOSTERONE 20.25 MG/ACT (1.62%) TD GEL: TRANSDERMAL | 5 refills | Status: DC

## 2018-03-09 MED ORDER — METFORMIN HCL ER 500 MG PO TB24: ORAL_TABLET | ORAL | 2 refills | Status: DC

## 2018-03-09 MED ORDER — TESTOSTERONE 20.25 MG/ACT (1.62%) TD GEL: TRANSDERMAL | 2 refills | Status: DC

## 2018-03-09 NOTE — Telephone Encounter (Signed)
Refill request for Metformin, Jardiance, Lisinopril and Testosterone.

## 2018-03-14 ENCOUNTER — Other Ambulatory Visit: Payer: Self-pay | Admitting: Internal Medicine

## 2018-03-14 MED ORDER — CANAGLIFLOZIN 300 MG PO TABS: ORAL_TABLET | ORAL | 1 refills | Status: DC

## 2018-03-19 ENCOUNTER — Other Ambulatory Visit: Payer: Self-pay | Admitting: Internal Medicine

## 2018-03-20 ENCOUNTER — Ambulatory Visit (INDEPENDENT_AMBULATORY_CARE_PROVIDER_SITE_OTHER)

## 2018-03-20 DIAGNOSIS — Z23 Encounter for immunization: Secondary | ICD-10-CM | POA: Diagnosis not present

## 2018-03-23 ENCOUNTER — Encounter: Payer: Self-pay | Admitting: Internal Medicine

## 2018-03-23 ENCOUNTER — Ambulatory Visit (INDEPENDENT_AMBULATORY_CARE_PROVIDER_SITE_OTHER): Admitting: Internal Medicine

## 2018-03-23 VITALS — BP 136/84 | HR 88 | Temp 97.7°F | Resp 18 | Ht 73.0 in | Wt 307.0 lb

## 2018-03-23 DIAGNOSIS — Z79899 Other long term (current) drug therapy: Secondary | ICD-10-CM

## 2018-03-23 DIAGNOSIS — E782 Mixed hyperlipidemia: Secondary | ICD-10-CM

## 2018-03-23 DIAGNOSIS — I10 Essential (primary) hypertension: Secondary | ICD-10-CM | POA: Diagnosis not present

## 2018-03-23 DIAGNOSIS — E559 Vitamin D deficiency, unspecified: Secondary | ICD-10-CM | POA: Diagnosis not present

## 2018-03-23 DIAGNOSIS — Z8249 Family history of ischemic heart disease and other diseases of the circulatory system: Secondary | ICD-10-CM | POA: Insufficient documentation

## 2018-03-23 DIAGNOSIS — Z6841 Body Mass Index (BMI) 40.0 and over, adult: Secondary | ICD-10-CM

## 2018-03-23 DIAGNOSIS — E1122 Type 2 diabetes mellitus with diabetic chronic kidney disease: Secondary | ICD-10-CM | POA: Diagnosis not present

## 2018-03-23 DIAGNOSIS — E349 Endocrine disorder, unspecified: Secondary | ICD-10-CM

## 2018-03-23 DIAGNOSIS — Z794 Long term (current) use of insulin: Secondary | ICD-10-CM

## 2018-03-23 DIAGNOSIS — N183 Chronic kidney disease, stage 3 (moderate): Secondary | ICD-10-CM

## 2018-03-23 MED ORDER — GLIMEPIRIDE 4 MG PO TABS: ORAL_TABLET | ORAL | 0 refills | Status: DC

## 2018-03-23 NOTE — Progress Notes (Signed)
This very nice 57 y.o. MBM presents for 3 month follow up with HTN, HLD, Pre-Diabetes and Vitamin D Deficiency.      Patient is treated for HTN & BP has been controlled at home. Today's BP is at goal -  136/84. Patient has had no complaints of any cardiac type chest pain, palpitations, dyspnea / orthopnea / PND, dizziness, claudication, or dependent edema.     Hyperlipidemia is controlled with diet & meds. Patient denies myalgias or other med SE's. Current Lipids are at goal: Lab Results  Component Value Date   CHOL 132 03/23/2018   HDL 47 03/23/2018   LDLCALC 69 03/23/2018   TRIG 82 03/23/2018   CHOLHDL 2.8 03/23/2018      Also, the patient has history of Morbid Obesity (BMI 38+) and insulin requiring  T2_DM /CKD3 (GFR 60). Patient is on therapy with Metformin, Invokana ($500 /month) and Soliqua ($750 /month) and last A1c 7.5% 4 mo ago in July and 8.3%  8 mo ago in March.  He is having difficulty getting insurance coverage of the 3rd generation anti-diabetic medications.He  has had no symptoms of reactive hypoglycemia, diabetic polys, paresthesias or visual blurring.  Current  A1c is improved, but still not at goal:  Lab Results  Component Value Date   HGBA1C 7.4 (H) 03/23/2018      Patient is on Testosterone Replacement with improved stamina & sense of well being.     Further, the patient also has history of Vitamin D Deficiency and supplements vitamin D without any suspected side-effects. Current vitamin D is not at goal ( 70-100): Lab Results  Component Value Date   VD25OH 54 03/23/2018   Current Outpatient Medications on File Prior to Visit  Medication Sig  . aspirin EC 81 MG tablet Take 81 mg by mouth daily.  . Cholecalciferol (VITAMIN D-3) 5000 UNITS TABS Take 5,000 Units by mouth daily.  Marland Kitchen glucose blood (CONTOUR NEXT TEST) test strip Check blood sugar 1 time a day-DX-E11.29  . lisinopril (PRINIVIL,ZESTRIL) 20 MG tablet Take 1 tablet (20 mg total) by mouth daily.  .  metFORMIN (GLUCOPHAGE-XR) 500 MG 24 hr tablet TAKE 4 TABLETS (2,000 MG TOTAL) BY MOUTH DAILY.  . sildenafil (VIAGRA) 100 MG tablet TAKE 1/2 TO 1 TABLET ONCE DAILY AS NEEDED  . SOLIQUA 100-33 UNT-MCG/ML SOPN INJECT 40 UNITS UNDER THE SKIN DAILY  . Testosterone 20.25 MG/ACT (1.62%) GEL APPLY 2 PUMPS TO BILATERAL SIDES DAILY   No current facility-administered medications on file prior to visit.    No Known Allergies PMHx:   Past Medical History:  Diagnosis Date  . Erectile dysfunction 09/11/2014  . History of kidney stones   . Hypogonadism male   . Left ureteral stone   . OSA on CPAP   . Right bundle branch block   . Sleep apnea   . Type 2 diabetes mellitus ([place])    Immunization History  Administered Date(s) Administered  . DTaP 04/02/2012  . Influenza Inj Mdck Quad With Preservative 03/20/2018  . Influenza Split 02/17/2014  . Influenza-Unspecified 01/13/2017  . Pneumococcal Polysaccharide-23 04/02/2012   Past Surgical History:  Procedure Laterality Date  . ANKLE SURGERY Left 2002  . CYSTOSCOPY W/ RETROGRADES Left 12/05/2014   Procedure: CYSTOSCOPY WITH RETROGRADE PYELOGRAM;  Surgeon: Ardis Hughs, MD;  [facility];  Service: Urology;  Laterality: Left;  . CYSTOSCOPY/RETROGRADE/URETEROSCOPY/STONE EXTRACTION WITH BASKET Left 12/05/2014   Procedure: LEFT  URETEROSCOPY, STONE EXTRACTION ;  Surgeon:  Ardis Hughs, MD;  Location: [facility];  Service: Urology;  Laterality: Left;  . EXTRACORPOREAL SHOCK WAVE LITHOTRIPSY Left 09-25-2014  . RHINOPLASTY  2001   FHx:    Reviewed / unchanged  SHx:    Reviewed / unchanged   Systems Review:  Constitutional: Denies fever, chills, wt changes, headaches, insomnia, fatigue, night sweats, change in appetite. Eyes: Denies redness, blurred vision, diplopia, discharge, itchy, watery eyes.  ENT: Denies discharge, congestion, post nasal drip, epistaxis, sore throat, earache, hearing loss, dental  pain, tinnitus, vertigo, sinus pain, snoring.  CV: Denies chest pain, palpitations, irregular heartbeat, syncope, dyspnea, diaphoresis, orthopnea, PND, claudication or edema. Respiratory: denies cough, dyspnea, DOE, pleurisy, hoarseness, laryngitis, wheezing.  Gastrointestinal: Denies dysphagia, odynophagia, heartburn, reflux, water brash, abdominal pain or cramps, nausea, vomiting, bloating, diarrhea, constipation, hematemesis, melena, hematochezia  or hemorrhoids. Genitourinary: Denies dysuria, frequency, urgency, nocturia, hesitancy, discharge, hematuria or flank pain. Musculoskeletal: Denies arthralgias, myalgias, stiffness, jt. swelling, pain, limping or strain/sprain.  Skin: Denies pruritus, rash, hives, warts, acne, eczema or change in skin lesion(s). Neuro: No weakness, tremor, incoordination, spasms, paresthesia or pain. Psychiatric: Denies confusion, memory loss or sensory loss. Endo: Denies change in weight, skin or hair change.  Heme/Lymph: No excessive bleeding, bruising or enlarged lymph nodes.  Physical Exam  BP 136/84   Pulse 88   Temp 97.7 F (36.5 C)   Resp 18   Ht 6\' 1"  (1.854 m)   Wt (!) 307 lb (139.3 kg)   BMI 40.50 kg/m   Appears  well nourished, well groomed  and in no distress.  Eyes: PERRLA, EOMs, conjunctiva no swelling or erythema. Sinuses: No frontal/maxillary tenderness ENT/Mouth: EAC's clear, TM's nl w/o erythema, bulging. Nares clear w/o erythema, swelling, exudates. Oropharynx clear without erythema or exudates. Oral hygiene is good. Tongue normal, non obstructing. Hearing intact.  Neck: Supple. Thyroid not palpable. Car 2+/2+ without bruits, nodes or JVD. Chest: Respirations nl with BS clear & equal w/o rales, rhonchi, wheezing or stridor.  Cor: Heart sounds normal w/ regular rate and rhythm without sig. murmurs, gallops, clicks or rubs. Peripheral pulses normal and equal  without edema.  Abdomen: Soft & bowel sounds normal. Non-tender w/o guarding,  rebound, hernias, masses or organomegaly.  Lymphatics: Unremarkable.  Musculoskeletal: Full ROM all peripheral extremities, joint stability, 5/5 strength and normal gait.  Skin: Warm, dry without exposed rashes, lesions or ecchymosis apparent.  Neuro: Cranial nerves intact, reflexes equal bilaterally. Sensory-motor testing grossly intact. Tendon reflexes grossly intact.  Pysch: Alert & oriented x 3.  Insight and judgement nl & appropriate. No ideations.  Assessment and Plan:  1. Essential hypertension  - Continue medication, monitor blood pressure at home.  - Continue DASH diet.  Reminder to go to the ER if any CP,  SOB, nausea, dizziness, severe HA, changes vision/speech.  - CBC with Differential/Platelet - COMPLETE METABOLIC PANEL WITH GFR - Magnesium - TSH  2. Mixed hyperlipidemia  - Continue diet/meds, exercise,& lifestyle modifications.  - Continue monitor periodic cholesterol/liver & renal functions   - Lipid panel - TSH  3. Type 2 diabetes mellitus with stage 3 chronic kidney disease, with long-term current use of insulin (HCC)  - Continue diet, exercise,  - lifestyle modifications.   - Very long discussion & counseling in prudent diet for weight loss & diabetic control.   - Recommended stopping Soliqua and continuing Metformin XR 2 gms /day and adding Rx Glimepiride 4 mg # 180 - taking 1 tab 2 x /day. Also  discussed  / recommended taking Cinnamon 1,000 to 2,000 mg caps 2 x /day w Barrie Lyme  - recommended monitoring CBG's 2 x /day at Bkfst & Supper and ROV 2 weeks. Also advise if CBG's run over 200 mg % to call in consideration of starting Nov 70/30 bid.   - Monitor appropriate labs.  - Hemoglobin A1c  4. Vitamin D deficiency  - Continue supplementation.  - VITAMIN D 25 Hydroxyl  5. Testosterone deficiency  - Testosterone  6. Class 3 severe obesity due to excess calories with serious comorbidity and body mass index (BMI) of 40.0 to 44.9 in adult ([place])  7.  Medication management  - CBC with Differential/Platelet - COMPLETE METABOLIC PANEL WITH GFR - Magnesium - Lipid panel - TSH - Hemoglobin A1c - VITAMIN D 25 Hydroxyl - Testosterone       Discussed  regular exercise, BP monitoring, weight control to achieve/maintain BMI less than 25 and discussed med and SE's. Recommended labs to assess and monitor clinical status with further disposition pending results of labs. Over 30 minutes of exam, counseling, chart review was performed.

## 2018-03-23 NOTE — Patient Instructions (Signed)

## 2018-03-24 ENCOUNTER — Encounter: Payer: Self-pay | Admitting: Internal Medicine

## 2018-03-24 LAB — COMPLETE METABOLIC PANEL WITH GFR
AG Ratio: 1.9 (calc) (ref 1.0–2.5)
ALBUMIN MSPROF: 4.3 g/dL (ref 3.6–5.1)
ALKALINE PHOSPHATASE (APISO): 125 U/L — AB (ref 40–115)
ALT: 33 U/L (ref 9–46)
AST: 17 U/L (ref 10–35)
BILIRUBIN TOTAL: 0.9 mg/dL (ref 0.2–1.2)
BUN / CREAT RATIO: 15 (calc) (ref 6–22)
BUN: 20 mg/dL (ref 7–25)
CHLORIDE: 106 mmol/L (ref 98–110)
CO2: 29 mmol/L (ref 20–32)
Calcium: 9.7 mg/dL (ref 8.6–10.3)
Creat: 1.36 mg/dL — ABNORMAL HIGH (ref 0.70–1.33)
GFR, EST AFRICAN AMERICAN: 67 mL/min/{1.73_m2} (ref >=60)
GFR, Est Non African American: 58 mL/min/{1.73_m2} — ABNORMAL LOW (ref >=60)
GLUCOSE: 195 mg/dL — AB (ref 65–99)
Globulin: 2.3 g/dL (calc) (ref 1.9–3.7)
POTASSIUM: 4.3 mmol/L (ref 3.5–5.3)
SODIUM: 141 mmol/L (ref 135–146)
TOTAL PROTEIN: 6.6 g/dL (ref 6.1–8.1)

## 2018-03-24 LAB — LIPID PANEL
Cholesterol: 132 mg/dL (ref <200)
HDL: 47 mg/dL (ref >40)
LDL Cholesterol (Calc): 69 mg/dL (calc)
NON-HDL CHOLESTEROL (CALC): 85 mg/dL (ref <130)
TRIGLYCERIDES: 82 mg/dL (ref <150)
Total CHOL/HDL Ratio: 2.8 (calc) (ref <5.0)

## 2018-03-24 LAB — HEMOGLOBIN A1C
Hgb A1c MFr Bld: 7.4 % of total Hgb — ABNORMAL HIGH (ref <5.7)
Mean Plasma Glucose: 166 (calc)
eAG (mmol/L): 9.2 (calc)

## 2018-03-24 LAB — CBC WITH DIFFERENTIAL/PLATELET
BASOS ABS: 39 {cells}/uL (ref 0–200)
BASOS PCT: 0.9 %
Eosinophils Absolute: 228 cells/uL (ref 15–500)
Eosinophils Relative: 5.3 %
HCT: 46.2 % (ref 38.5–50.0)
HEMOGLOBIN: 15.6 g/dL (ref 13.2–17.1)
Lymphs Abs: 1763 cells/uL (ref 850–3900)
MCH: 29.1 pg (ref 27.0–33.0)
MCHC: 33.8 g/dL (ref 32.0–36.0)
MCV: 86.2 fL (ref 80.0–100.0)
MONOS PCT: 7.6 %
MPV: 11.5 fL (ref 7.5–12.5)
Neutro Abs: 1944 cells/uL (ref 1500–7800)
Neutrophils Relative %: 45.2 %
Platelets: 217 10*3/uL (ref 140–400)
RBC: 5.36 10*6/uL (ref 4.20–5.80)
RDW: 12.1 % (ref 11.0–15.0)
TOTAL LYMPHOCYTE: 41 %
WBC: 4.3 10*3/uL (ref 3.8–10.8)
WBCMIX: 327 {cells}/uL (ref 200–950)

## 2018-03-24 LAB — TSH: TSH: 1.45 mIU/L (ref 0.40–4.50)

## 2018-03-24 LAB — TESTOSTERONE: Testosterone: 329 ng/dL (ref 250–827)

## 2018-03-24 LAB — MAGNESIUM: MAGNESIUM: 1.9 mg/dL (ref 1.5–2.5)

## 2018-03-24 LAB — VITAMIN D 25 HYDROXY (VIT D DEFICIENCY, FRACTURES): Vit D, 25-Hydroxy: 54 ng/mL (ref 30–100)

## 2018-04-06 ENCOUNTER — Encounter: Payer: Self-pay | Admitting: Internal Medicine

## 2018-04-06 ENCOUNTER — Ambulatory Visit (INDEPENDENT_AMBULATORY_CARE_PROVIDER_SITE_OTHER): Admitting: Internal Medicine

## 2018-04-06 VITALS — BP 128/82 | HR 84 | Temp 97.9°F | Resp 18 | Ht 73.0 in | Wt 305.0 lb

## 2018-04-06 DIAGNOSIS — E1122 Type 2 diabetes mellitus with diabetic chronic kidney disease: Secondary | ICD-10-CM

## 2018-04-06 DIAGNOSIS — Z79899 Other long term (current) drug therapy: Secondary | ICD-10-CM | POA: Diagnosis not present

## 2018-04-06 DIAGNOSIS — N183 Chronic kidney disease, stage 3 unspecified: Secondary | ICD-10-CM

## 2018-04-06 DIAGNOSIS — I10 Essential (primary) hypertension: Secondary | ICD-10-CM | POA: Diagnosis not present

## 2018-04-06 DIAGNOSIS — Z794 Long term (current) use of insulin: Secondary | ICD-10-CM

## 2018-04-06 NOTE — Progress Notes (Signed)
   Subjective:    Patient ID: Carlos [name], male    DOB: 25-Dec-1960, 57 y.o.   MRN: [id number]  HPI     Patient is a very nice 57 yo MBM with HTN, HLD, T2_DM and Vit D Deficiency/. Patient was seen 2 weeks ago with major change in Diabetic regimen stopping Invokana, Soliqua - continued Metformin XR and added Glimepiride 4 mg bid. Also recommended add Cinnamon 1000 mg 2 x/day. Last A1c 7.4% (ave 166 mg%) on Nov 22. Patient reports CBG's ranging betw 120-180's. No hypoglycemic sx's.   Medication Sig  . aspirin EC 81 MG tablet Take 81 mg by mouth daily.  . Cholecalciferol (VITAMIN D-3) 5000 UNITS TABS Take 5,000 Units by mouth daily.  Marland Kitchen glimepiride (AMARYL) 4 MG tablet Take 1 tablet 2 x   /day with meals for Diabetes  . glucose blood (CONTOUR NEXT TEST) test strip Check blood sugar 1 time a day-DX-E11.29  . lisinopril (PRINIVIL,ZESTRIL) 20 MG tablet Take 1 tablet (20 mg total) by mouth daily.  . metFORMIN (GLUCOPHAGE-XR) 500 MG 24 hr tablet TAKE 4 TABLETS (2,000 MG TOTAL) BY MOUTH DAILY.  . sildenafil (VIAGRA) 100 MG tablet TAKE 1/2 TO 1 TABLET ONCE DAILY AS NEEDED  . SOLIQUA 100-33 UNT-MCG/ML SOPN INJECT 40 UNITS UNDER THE SKIN DAILY  . Testosterone 20.25 MG/ACT (1.62%) GEL APPLY 2 PUMPS TO BILATERAL SIDES DAILY   No facility-administered medications prior to visit.    No Known Allergies Past Medical History:  Diagnosis Date  . Erectile dysfunction 09/11/2014  . History of kidney stones   . Hypogonadism male   . Left ureteral stone   . OSA on CPAP   . Right bundle branch block   . Sleep apnea   . Type 2 diabetes mellitus ([place])    Past Surgical History:  Procedure Laterality Date  . ANKLE SURGERY Left 2002  . CYSTOSCOPY W/ RETROGRADES Left 12/05/2014   Procedure: CYSTOSCOPY WITH RETROGRADE PYELOGRAM;  Surgeon: Ardis Hughs, MD;  Location[facility];  Service: Urology;  Laterality: Left;  . CYSTOSCOPY/RETROGRADE/URETEROSCOPY/STONE EXTRACTION WITH BASKET Left  12/05/2014   Procedure: LEFT  URETEROSCOPY, STONE EXTRACTION ;  Surgeon: Ardis Hughs, MD;  Location[facility];  Service: Urology;  Laterality: Left;  . EXTRACORPOREAL SHOCK WAVE LITHOTRIPSY Left 09-25-2014  . RHINOPLASTY  2001   Review of Systems     10 point systems review negative except as above.    Objective:   Physical Exam  BP 128/82   Pulse 84   Temp 97.9 F (36.6 C)   Resp 18   Ht 6\' 1"  (1.854 m)   Wt (!) 305 lb (138.3 kg)   BMI 40.24 kg/m   HEENT - WNL. Neck - supple.  Chest - Clear equal BS. Cor - Nl HS. RRR w/o sig MGR. PP 1(+). No edema. MS- FROM w/o deformities.  Gait Nl. Neuro -  Nl w/o focal abnormalities.    Assessment & Plan:   1. Essential hypertension   2. Type 2 diabetes mellitus with stage 3 chronic kidney disease, with long-term current use of insulin ([place])   3. Medication management  - Discussed  with patient increasing Cinnamon from 500  Mg up to 2000 mg 2 x /day . Again discussed principles of hypoglycemic diet & weight loss in [place] of avoiing insulin treatments.

## 2018-04-07 ENCOUNTER — Encounter: Payer: Self-pay | Admitting: Internal Medicine

## 2018-04-20 ENCOUNTER — Encounter: Payer: Self-pay | Admitting: Internal Medicine

## 2018-04-20 ENCOUNTER — Ambulatory Visit (INDEPENDENT_AMBULATORY_CARE_PROVIDER_SITE_OTHER): Admitting: Internal Medicine

## 2018-04-20 VITALS — BP 122/78 | HR 72 | Temp 97.3°F | Resp 18 | Ht 73.0 in | Wt 309.2 lb

## 2018-04-20 DIAGNOSIS — E1122 Type 2 diabetes mellitus with diabetic chronic kidney disease: Secondary | ICD-10-CM | POA: Diagnosis not present

## 2018-04-20 DIAGNOSIS — N183 Chronic kidney disease, stage 3 (moderate): Secondary | ICD-10-CM | POA: Diagnosis not present

## 2018-04-20 DIAGNOSIS — I10 Essential (primary) hypertension: Secondary | ICD-10-CM

## 2018-04-20 DIAGNOSIS — Z6841 Body Mass Index (BMI) 40.0 and over, adult: Secondary | ICD-10-CM

## 2018-04-20 DIAGNOSIS — Z794 Long term (current) use of insulin: Secondary | ICD-10-CM

## 2018-04-20 NOTE — Progress Notes (Signed)
Subjective:    Patient ID: Carlos [name], male    DOB: 01/05/1961, 57 y.o.   MRN: [id number]  HPI  This nice 57 yo MBM followed with HTN, HLD, T2_DM Rosalee Kaufman returns for 2sd 2 week f/u after change in his Diabetic regimen and attempting control with Metformin 2 gms and Glimepiride 4 mg bid. Patient has also started taking Cinnamon 1,000 mg bid. He reports CBG's are ranging betw 160-220 mg% He is noted to have gained 16# weight over the last 9 months from 293# in April to his current weight of 309#. He does admit he's still overeating. Denies sx's of Diabetic polys, paresthesias or visual blurring. BP is stable & HT sx's review is negative.   Wt Readings from Last 3 Encounters:  04/20/18 (!) 309 lb 3.2 oz (140.3 kg)  04/06/18 (!) 305 lb (138.3 kg)  03/23/18 (!) 307 lb (139.3 kg)   Medication Sig  . aspirin EC 81 MG tablet Take 81 mg by mouth daily.  . Cholecalciferol (VITAMIN D-3) 5000 UNITS TABS Take 5,000 Units by mouth daily.  . Cinnamon 500 MG TABS Take 2 tablets by mouth 2 (two) times daily.   Marland Kitchen glimepiride (AMARYL) 4 MG tablet Take 1 tablet 2 x   /day with meals for Diabetes  . lisinopril (PRINIVIL,ZESTRIL) 20 MG tablet Take 1 tablet (20 mg total) by mouth daily.  . metFORMIN (GLUCOPHAGE-XR) 500 MG 24 hr tablet TAKE 4 TABLETS (2,000 MG TOTAL) BY MOUTH DAILY.  . sildenafil (VIAGRA) 100 MG tablet TAKE 1/2 TO 1 TABLET ONCE DAILY AS NEEDED  . Testosterone 20.25 MG/ACT (1.62%) GEL APPLY 2 PUMPS TO BILATERAL SIDES DAILY   No Known Allergies   Past Medical History:  Diagnosis Date  . Erectile dysfunction 09/11/2014  . History of kidney stones   . Hypogonadism male   . Left ureteral stone   . OSA on CPAP   . Right bundle branch block   . Sleep apnea   . Type 2 diabetes mellitus ([place])    Past Surgical History:  Procedure Laterality Date  . ANKLE SURGERY Left 2002  . CYSTOSCOPY W/ RETROGRADES Left 12/05/2014   Procedure: CYSTOSCOPY WITH RETROGRADE PYELOGRAM;  Surgeon: Ardis Hughs,  MD;  Location[facility];  Service: Urology;  Laterality: Left;  . CYSTOSCOPY/RETROGRADE/URETEROSCOPY/STONE EXTRACTION WITH BASKET Left 12/05/2014   Procedure: LEFT  URETEROSCOPY, STONE EXTRACTION ;  Surgeon: Ardis Hughs, MD;  Location[facility];  Service: Urology;  Laterality: Left;  . EXTRACORPOREAL SHOCK WAVE LITHOTRIPSY Left 09-25-2014  . RHINOPLASTY  2001   Review of Systems   10 point systems review negative except as above.    Objective:   Physical Exam  BP 122/78   Pulse 72   Temp (!) 97.3 F (36.3 C)   Resp 18   Ht 6\' 1"  (1.854 m)   Wt (!) 309 lb 3.2 oz (140.3 kg)   BMI 40.79 kg/m   HEENT - WNL. Neck - supple.  Chest - Clear equal BS. Cor - Nl HS. RRR w/o sig MGR. PP 1(+). No edema. MS- FROM w/o deformities.  Gait Nl. Neuro -  Nl w/o focal abnormalities.    Assessment & Plan:   1. Type 2 diabetes mellitus with stage 3 chronic kidney disease, with long-term current use of insulin ([place]) - long discussion [id number] of better controlled diet with dietary instructions reviewed in great detail.  2. Class 3 severe obesity due to excess calories with serious comorbidity  and body mass index (BMI) of 40.0 to 44.9 in adult (HCC)  - phentermine (ADIPEX-P) 37.5 MG tablet; Take 1/2 to 1 tablet every morning for dieting & weight  loss  Dispense: 90 tablet; Refill: 1 - topiramate (TOPAMAX) 50 MG tablet; Take 1 tablet 2 x /day at Suppertime & Bedtime for Weight Loss  Dispense: 180 tablet; Refill: 1  3. Essential hypertension

## 2018-04-21 MED ORDER — TOPIRAMATE 50 MG PO TABS: ORAL_TABLET | ORAL | 1 refills | Status: DC

## 2018-04-21 MED ORDER — PHENTERMINE HCL 37.5 MG PO TABS: ORAL_TABLET | ORAL | 1 refills | Status: DC

## 2018-04-21 NOTE — Patient Instructions (Signed)

## 2018-04-30 ENCOUNTER — Encounter: Payer: Self-pay | Admitting: Physician Assistant

## 2018-06-04 ENCOUNTER — Encounter: Payer: Self-pay | Admitting: Internal Medicine

## 2018-06-04 NOTE — Patient Instructions (Signed)

## 2018-06-04 NOTE — Progress Notes (Signed)
[facility] Unk Pinto, M.D.     Uvaldo Bristle. Silverio Lay, P.A.-C Liane Comber, [place] [address] [place], [place] [address] Telephone [phone] Telefax [phone] Annual Screening/Preventative Visit & Comprehensive Evaluation &  Examination     This very nice 58 y.o. MBM  presents for a Screening /Preventative Visit & comprehensive evaluation and management of multiple medical co-morbidities.  Patient has been followed for HTN, HLD, T2_NIDDM / CKD3, OSA / CPAP  and Vitamin D Deficiency. Patient is also followed by Dr Marin Olp for Hemochromatosis      HTN predates since 2005. Patient's BP has been controlled at home and patient denies any cardiac symptoms as chest pain, palpitations, shortness of breath, dizziness or ankle swelling. Today's BP  is at goal - 128/79.      Patient's hyperlipidemia is controlled with diet and medications. Patient denies myalgias or other medication SE's. Last lipids were at goal: Lab Results  Component Value Date   CHOL 132 03/23/2018   HDL 47 03/23/2018   LDLCALC 69 03/23/2018   TRIG 82 03/23/2018   CHOLHDL 2.8 03/23/2018      Patient has hx/o T2_NIDDM predating since 2005. Patient has CKD3 attributed to his HTN & DM.  and patient denies reactive hypoglycemic symptoms, visual blurring, diabetic polys or paresthesias. Patient is still working hard to stay off of Insulin. Last A1c was not at goal: Lab Results  Component Value Date   HGBA1C 7.4 (H) 03/23/2018      Patient has hx/o Low Testosterone & is on Replacement by Ointment.     Finally, patient has history of Vitamin D Deficiency and last Vitamin D was near goal: Lab Results  Component Value Date   VD25OH 16 03/23/2018   Current Outpatient Medications on File Prior to Visit  Medication Sig  . aspirin EC 81 MG tablet Take 81 mg by mouth daily.  . Cholecalciferol (VITAMIN D-3) 5000 UNITS TABS Take 5,000 Units by  mouth daily.  . Cinnamon 500 MG TABS Take 2 tablets by mouth 2 (two) times daily.   Marland Kitchen glimepiride (AMARYL) 4 MG tablet Take 1 tablet 2 x   /day with meals for Diabetes  . glucose blood (CONTOUR NEXT TEST) test strip Check blood sugar 1 time a day-[id number]  . lisinopril (PRINIVIL,ZESTRIL) 20 MG tablet Take 1 tablet (20 mg total) by mouth daily.  . metFORMIN (GLUCOPHAGE-XR) 500 MG 24 hr tablet TAKE 4 TABLETS (2,000 MG TOTAL) BY MOUTH DAILY.  Marland Kitchen phentermine (ADIPEX-P) 37.5 MG tablet Take 1/2 to 1 tablet every morning for dieting & weight  loss  . sildenafil (VIAGRA) 100 MG tablet TAKE 1/2 TO 1 TABLET ONCE DAILY AS NEEDED  . Testosterone 20.25 MG/ACT (1.62%) GEL APPLY 2 PUMPS TO BILATERAL SIDES DAILY  . topiramate (TOPAMAX) 50 MG tablet Take 1 tablet 2 x /day at Suppertime & Bedtime for Weight Loss   No current facility-administered medications on file prior to visit.    No Known Allergies Past Medical History:  Diagnosis Date  . Erectile dysfunction 09/11/2014  . History of kidney stones   . Hypogonadism male   . Left ureteral stone   . OSA on CPAP   . Right bundle branch block   . Sleep apnea   . Type 2 diabetes mellitus [facility])    Health Maintenance  Topic Date Due  . OPHTHALMOLOGY EXAM  08/17/2018  . HEMOGLOBIN A1C  09/21/2018  . FOOT EXAM  06/06/2019  .  COLONOSCOPY  01/06/2020  . TETANUS/TDAP  10/13/2021  . INFLUENZA VACCINE  Completed  . PNEUMOCOCCAL POLYSACCHARIDE VACCINE AGE 41-64 HIGH RISK  Completed  . Hepatitis C Screening  Completed  . HIV Screening  Completed   Immunization History  Administered Date(s) Administered  . DTaP 04/02/2012  . Influenza Inj Mdck Quad With Preservative 03/20/2018  . Influenza Split 02/17/2014  . Influenza-Unspecified 01/13/2017  . PPD Test 06/05/2018  . Pneumococcal Polysaccharide-23 04/02/2012   Last Colon - 01/05/2017 - Dr Hilarie Fredrickson - Recc 3 yr f/u Sept 2021  Past Surgical History:  Procedure Laterality Date  . ANKLE SURGERY Left 2002   . CYSTOSCOPY W/ RETROGRADES Left 12/05/2014   Procedure: CYSTOSCOPY WITH RETROGRADE PYELOGRAM;  Surgeon: Ardis Hughs, MD;  Location: [facility];  Service: Urology;  Laterality: Left;  . CYSTOSCOPY/RETROGRADE/URETEROSCOPY/STONE EXTRACTION WITH BASKET Left 12/05/2014   Procedure: LEFT  URETEROSCOPY, STONE EXTRACTION ;  Surgeon: Ardis Hughs, MD;  Location[facility];  Service: Urology;  Laterality: Left;  . EXTRACORPOREAL SHOCK WAVE LITHOTRIPSY Left 09-25-2014  . RHINOPLASTY  2001   Family History  Problem Relation Age of Onset  . Hypertension Mother   . Cancer Mother        breast cancer  . Diabetes Father   . Colon cancer Neg Hx   . Colon polyps Neg Hx   . Esophageal cancer Neg Hx   . Rectal cancer Neg Hx   . Stomach cancer Neg Hx    Social History   Tobacco Use  . Smoking status: Never Smoker  . Smokeless tobacco: Never Used  Substance Use Topics  . Alcohol use: Yes    Comment: RARE  . Drug use: No    ROS Constitutional: Denies fever, chills, weight loss/gain, headaches, insomnia,  night sweats, and change in appetite. Does c/o fatigue. Eyes: Denies redness, blurred vision, diplopia, discharge, itchy, watery eyes.  ENT: Denies discharge, congestion, post nasal drip, epistaxis, sore throat, earache, hearing loss, dental pain, Tinnitus, Vertigo, Sinus pain, snoring.  Cardio: Denies chest pain, palpitations, irregular heartbeat, syncope, dyspnea, diaphoresis, orthopnea, PND, claudication, edema Respiratory: denies cough, dyspnea, DOE, pleurisy, hoarseness, laryngitis, wheezing.  Gastrointestinal: Denies dysphagia, heartburn, reflux, water brash, pain, cramps, nausea, vomiting, bloating, diarrhea, constipation, hematemesis, melena, hematochezia, jaundice, hemorrhoids Genitourinary: Denies dysuria, frequency, urgency, nocturia, hesitancy, discharge, hematuria, flank pain Musculoskeletal: Denies arthralgia, myalgia, stiffness, Jt. Swelling,  pain, limp, and strain/sprain. Denies falls. Skin: Denies puritis, rash, hives, warts, acne, eczema, changing in skin lesion Neuro: No weakness, tremor, incoordination, spasms, paresthesia, pain Psychiatric: Denies confusion, memory loss, sensory loss. Denies Depression. Endocrine: Denies change in weight, skin, hair change, nocturia, and paresthesia, diabetic polys, visual blurring, hyper / hypo glycemic episodes.  Heme/Lymph: No excessive bleeding, bruising, enlarged lymph nodes.  Physical Exam  BP 128/79   Pulse 84   Temp 97.7 F (36.5 C)   Ht 6' 1.5" (1.867 m)   Wt (!) 312 lb 12.8 oz (141.9 kg)   SpO2 97%   BMI 40.71 kg/m   General Appearance: Well nourished, well groomed and in no apparent distress.  Eyes: PERRLA, EOMs, conjunctiva no swelling or erythema, normal fundi and vessels. Sinuses: No frontal/maxillary tenderness ENT/Mouth: EACs patent / TMs  nl. Nares clear without erythema, swelling, mucoid exudates. Oral hygiene is good. No erythema, swelling, or exudate. Tongue normal, non-obstructing. Tonsils not swollen or erythematous. Hearing normal.  Neck: Supple, thyroid not palpable. No bruits, nodes or JVD. Respiratory: Respiratory effort normal.  BS equal  and clear bilateral without rales, rhonci, wheezing or stridor. Cardio: Heart sounds are normal with regular rate and rhythm and no murmurs, rubs or gallops. Peripheral pulses are normal and equal bilaterally without edema. No aortic or femoral bruits. Chest: symmetric with normal excursions and percussion. Abdomen: Flat, soft with bowel sounds active. Nontender, no guarding, rebound, hernias, masses, or organomegaly.  Lymphatics: Non tender without lymphadenopathy.  Musculoskeletal: Full ROM all peripheral extremities, joint stability, 5/5 strength, and normal gait. Skin: Warm and dry without rashes, lesions, cyanosis, clubbing or  ecchymosis.  Neuro: Cranial nerves intact, reflexes equal bilaterally. Normal muscle tone,  no cerebellar symptoms. Sensation intact.  Pysch: Alert and oriented X 3, normal affect, Insight and Judgment appropriate.   Assessment and Plan  1. Annual Preventative Screening Examination  2. Essential hypertension  - EKG 12-Lead - [place], RETROPERITNL ABD,  LTD - Urinalysis, Routine w reflex microscopic - Microalbumin / creatinine urine ratio - CBC with Differential/Platelet - COMPLETE METABOLIC PANEL WITH GFR - Magnesium - TSH  3. Hyperlipidemia, mixed  - EKG 12-Lead - [place], RETROPERITNL ABD,  LTD - Lipid panel - TSH  4. Type 2 diabetes mellitus with stage 3 chronic kidney disease, with long-term current use of insulin (HCC)  - EKG 12-Lead - [place], RETROPERITNL ABD,  LTD - Urinalysis, Routine w reflex microscopic - Microalbumin / creatinine urine ratio - HM DIABETES FOOT EXAM - LOW EXTREMITY NEUR EXAM DOCUM - Hemoglobin A1c - Insulin, random  5. Vitamin D deficiency  - VITAMIN D 25 Hydroxyl  6. Testosterone deficiency   7. OSA on CPAP   8. Hereditary hemochromatosis (HCC)  - Iron,Total/Total Iron Binding Cap - Ferritin  9. Class 3 severe obesity due to excess calories with serious comorbidity and body mass index (BMI) of 40.0 to 44.9 in adult ([place])   10. Screening for colorectal cancer  - POC Hemoccult Bld/Stl  11. BPH with obstruction/lower urinary tract symptoms  - PSA  12. Prostate cancer screening  - PSA  13. Screening for ischemic heart disease  - EKG 12-Lead  14. FH: hypertension  - EKG 12-Lead - [place], RETROPERITNL ABD,  LTD  15. Screening for AAA (aortic abdominal aneurysm)  - [place], RETROPERITNL ABD,  LTD  16. Screening-pulmonary TB  - TB Skin Test  17. Fatigue  - Iron,Total/Total Iron Binding Cap - Vitamin B12  18. Medication management  - Uric acid - CBC with Differential/Platelet - COMPLETE METABOLIC PANEL WITH GFR - Magnesium - Lipid panel - TSH - Hemoglobin A1c - Insulin, random - VITAMIN D 25 Hydroxyl -  Ferritin       Patient was counseled in prudent diet to achieve/maintain BMI less than 25 for weight control, BP monitoring, regular exercise and medications. Discussed med's effects and SE's. Screening labs and tests as requested with regular follow-up as recommended. Over 40 minutes of exam, counseling, chart review and high complex critical decision making was performed.

## 2018-06-05 ENCOUNTER — Ambulatory Visit (INDEPENDENT_AMBULATORY_CARE_PROVIDER_SITE_OTHER): Admitting: Internal Medicine

## 2018-06-05 ENCOUNTER — Encounter: Payer: Self-pay | Admitting: Internal Medicine

## 2018-06-05 VITALS — BP 128/79 | HR 84 | Temp 97.7°F | Ht 73.5 in | Wt 312.8 lb

## 2018-06-05 DIAGNOSIS — Z111 Encounter for screening for respiratory tuberculosis: Secondary | ICD-10-CM | POA: Diagnosis not present

## 2018-06-05 DIAGNOSIS — Z8249 Family history of ischemic heart disease and other diseases of the circulatory system: Secondary | ICD-10-CM | POA: Diagnosis not present

## 2018-06-05 DIAGNOSIS — N183 Chronic kidney disease, stage 3 unspecified: Secondary | ICD-10-CM

## 2018-06-05 DIAGNOSIS — I10 Essential (primary) hypertension: Secondary | ICD-10-CM

## 2018-06-05 DIAGNOSIS — Z794 Long term (current) use of insulin: Secondary | ICD-10-CM

## 2018-06-05 DIAGNOSIS — Z1329 Encounter for screening for other suspected endocrine disorder: Secondary | ICD-10-CM | POA: Diagnosis not present

## 2018-06-05 DIAGNOSIS — Z130 Encounter for screening for diseases of the blood and blood-forming organs and certain disorders involving the immune mechanism: Secondary | ICD-10-CM

## 2018-06-05 DIAGNOSIS — Z13220 Encounter for screening for lipoid disorders: Secondary | ICD-10-CM

## 2018-06-05 DIAGNOSIS — Z1211 Encounter for screening for malignant neoplasm of colon: Secondary | ICD-10-CM

## 2018-06-05 DIAGNOSIS — E349 Endocrine disorder, unspecified: Secondary | ICD-10-CM

## 2018-06-05 DIAGNOSIS — Z125 Encounter for screening for malignant neoplasm of prostate: Secondary | ICD-10-CM

## 2018-06-05 DIAGNOSIS — Z79899 Other long term (current) drug therapy: Secondary | ICD-10-CM

## 2018-06-05 DIAGNOSIS — N138 Other obstructive and reflux uropathy: Secondary | ICD-10-CM

## 2018-06-05 DIAGNOSIS — Z9989 Dependence on other enabling machines and devices: Secondary | ICD-10-CM

## 2018-06-05 DIAGNOSIS — Z1212 Encounter for screening for malignant neoplasm of rectum: Secondary | ICD-10-CM

## 2018-06-05 DIAGNOSIS — Z6841 Body Mass Index (BMI) 40.0 and over, adult: Secondary | ICD-10-CM

## 2018-06-05 DIAGNOSIS — Z1389 Encounter for screening for other disorder: Secondary | ICD-10-CM | POA: Diagnosis not present

## 2018-06-05 DIAGNOSIS — Z0000 Encounter for general adult medical examination without abnormal findings: Secondary | ICD-10-CM | POA: Diagnosis not present

## 2018-06-05 DIAGNOSIS — Z131 Encounter for screening for diabetes mellitus: Secondary | ICD-10-CM | POA: Diagnosis not present

## 2018-06-05 DIAGNOSIS — R5383 Other fatigue: Secondary | ICD-10-CM

## 2018-06-05 DIAGNOSIS — E1122 Type 2 diabetes mellitus with diabetic chronic kidney disease: Secondary | ICD-10-CM

## 2018-06-05 DIAGNOSIS — R350 Frequency of micturition: Secondary | ICD-10-CM

## 2018-06-05 DIAGNOSIS — N401 Enlarged prostate with lower urinary tract symptoms: Secondary | ICD-10-CM | POA: Diagnosis not present

## 2018-06-05 DIAGNOSIS — E559 Vitamin D deficiency, unspecified: Secondary | ICD-10-CM

## 2018-06-05 DIAGNOSIS — Z0001 Encounter for general adult medical examination with abnormal findings: Secondary | ICD-10-CM

## 2018-06-05 DIAGNOSIS — E782 Mixed hyperlipidemia: Secondary | ICD-10-CM

## 2018-06-05 DIAGNOSIS — Z136 Encounter for screening for cardiovascular disorders: Secondary | ICD-10-CM | POA: Diagnosis not present

## 2018-06-05 DIAGNOSIS — G4733 Obstructive sleep apnea (adult) (pediatric): Secondary | ICD-10-CM

## 2018-06-06 LAB — LIPID PANEL
Cholesterol: 164 mg/dL (ref <200)
HDL: 55 mg/dL (ref >=40)
LDL Cholesterol (Calc): 81 mg/dL (calc)
Non-HDL Cholesterol (Calc): 109 mg/dL (calc) (ref <130)
Total CHOL/HDL Ratio: 3 (calc) (ref <5.0)
Triglycerides: 187 mg/dL — ABNORMAL HIGH (ref <150)

## 2018-06-06 LAB — COMPLETE METABOLIC PANEL WITH GFR
AG Ratio: 1.9 (calc) (ref 1.0–2.5)
ALT: 43 U/L (ref 9–46)
AST: 19 U/L (ref 10–35)
Albumin: 4.4 g/dL (ref 3.6–5.1)
Alkaline phosphatase (APISO): 144 U/L (ref 35–144)
BILIRUBIN TOTAL: 1 mg/dL (ref 0.2–1.2)
BUN: 17 mg/dL (ref 7–25)
CHLORIDE: 105 mmol/L (ref 98–110)
CO2: 24 mmol/L (ref 20–32)
Calcium: 9.9 mg/dL (ref 8.6–10.3)
Creat: 1.26 mg/dL (ref 0.70–1.33)
GFR, Est African American: 74 mL/min/{1.73_m2} (ref >=60)
GFR, Est Non African American: 64 mL/min/{1.73_m2} (ref >=60)
Globulin: 2.3 g/dL (calc) (ref 1.9–3.7)
Glucose, Bld: 204 mg/dL — ABNORMAL HIGH (ref 65–99)
Potassium: 4.1 mmol/L (ref 3.5–5.3)
Sodium: 140 mmol/L (ref 135–146)
Total Protein: 6.7 g/dL (ref 6.1–8.1)

## 2018-06-06 LAB — URINALYSIS, ROUTINE W REFLEX MICROSCOPIC
BILIRUBIN URINE: NEGATIVE
Bacteria, UA: NONE SEEN /HPF
HGB URINE DIPSTICK: NEGATIVE
Hyaline Cast: NONE SEEN /LPF
Ketones, ur: NEGATIVE
LEUKOCYTES UA: NEGATIVE
Nitrite: NEGATIVE
RBC / HPF: NONE SEEN /HPF (ref 0–2)
Specific Gravity, Urine: 1.027 (ref 1.001–1.03)
Squamous Epithelial / HPF: NONE SEEN /HPF (ref <=5)
WBC, UA: NONE SEEN /HPF (ref 0–5)
pH: 6.5 (ref 5.0–8.0)

## 2018-06-06 LAB — CBC WITH DIFFERENTIAL/PLATELET
Absolute Monocytes: 262 cells/uL (ref 200–950)
Basophils Absolute: 60 cells/uL (ref 0–200)
Basophils Relative: 1.3 %
Eosinophils Absolute: 244 cells/uL (ref 15–500)
Eosinophils Relative: 5.3 %
HCT: 47.2 % (ref 38.5–50.0)
Hemoglobin: 16.3 g/dL (ref 13.2–17.1)
Lymphs Abs: 2088 cells/uL (ref 850–3900)
MCH: 29.3 pg (ref 27.0–33.0)
MCHC: 34.5 g/dL (ref 32.0–36.0)
MCV: 84.7 fL (ref 80.0–100.0)
MPV: 12.1 fL (ref 7.5–12.5)
Monocytes Relative: 5.7 %
Neutro Abs: 1946 cells/uL (ref 1500–7800)
Neutrophils Relative %: 42.3 %
Platelets: 226 10*3/uL (ref 140–400)
RBC: 5.57 10*6/uL (ref 4.20–5.80)
RDW: 12.4 % (ref 11.0–15.0)
Total Lymphocyte: 45.4 %
WBC: 4.6 10*3/uL (ref 3.8–10.8)

## 2018-06-06 LAB — HEMOGLOBIN A1C
EAG (MMOL/L): 11.7 (calc)
Hgb A1c MFr Bld: 9 % of total Hgb — ABNORMAL HIGH (ref <5.7)
Mean Plasma Glucose: 212 (calc)

## 2018-06-06 LAB — IRON, TOTAL/TOTAL IRON BINDING CAP
%SAT: 46 % (calc) (ref 20–48)
Iron: 119 ug/dL (ref 50–180)
TIBC: 260 mcg/dL (calc) (ref 250–425)

## 2018-06-06 LAB — VITAMIN D 25 HYDROXY (VIT D DEFICIENCY, FRACTURES): Vit D, 25-Hydroxy: 38 ng/mL (ref 30–100)

## 2018-06-06 LAB — FERRITIN: Ferritin: 196 ng/mL (ref 38–380)

## 2018-06-06 LAB — MICROALBUMIN / CREATININE URINE RATIO
Creatinine, Urine: 83 mg/dL (ref 20–320)
Microalb Creat Ratio: 348 mcg/mg creat — ABNORMAL HIGH (ref <30)
Microalb, Ur: 28.9 mg/dL

## 2018-06-06 LAB — URIC ACID: URIC ACID, SERUM: 5.9 mg/dL (ref 4.0–8.0)

## 2018-06-06 LAB — VITAMIN B12: Vitamin B-12: 750 pg/mL (ref 200–1100)

## 2018-06-06 LAB — MAGNESIUM: Magnesium: 1.8 mg/dL (ref 1.5–2.5)

## 2018-06-06 LAB — INSULIN, RANDOM: Insulin: 30 u[IU]/mL — ABNORMAL HIGH (ref 2.0–19.6)

## 2018-06-06 LAB — PSA: PSA: 3.8 ng/mL (ref <=4.0)

## 2018-06-06 LAB — TSH: TSH: 0.9 mIU/L (ref 0.40–4.50)

## 2018-06-21 ENCOUNTER — Other Ambulatory Visit: Payer: Self-pay

## 2018-06-21 DIAGNOSIS — Z1211 Encounter for screening for malignant neoplasm of colon: Secondary | ICD-10-CM

## 2018-06-21 DIAGNOSIS — Z1212 Encounter for screening for malignant neoplasm of rectum: Principal | ICD-10-CM

## 2018-06-21 LAB — POC HEMOCCULT BLD/STL (HOME/3-CARD/SCREEN)
Card #2 Fecal Occult Blod, POC: NEGATIVE
Card #3 Fecal Occult Blood, POC: NEGATIVE
Fecal Occult Blood, POC: NEGATIVE

## 2018-07-03 ENCOUNTER — Other Ambulatory Visit: Payer: Self-pay | Admitting: *Deleted

## 2018-07-03 DIAGNOSIS — E1122 Type 2 diabetes mellitus with diabetic chronic kidney disease: Secondary | ICD-10-CM

## 2018-07-03 DIAGNOSIS — N183 Chronic kidney disease, stage 3 unspecified: Secondary | ICD-10-CM

## 2018-07-03 DIAGNOSIS — Z6841 Body Mass Index (BMI) 40.0 and over, adult: Principal | ICD-10-CM

## 2018-07-03 MED ORDER — GLIMEPIRIDE 4 MG PO TABS: ORAL_TABLET | ORAL | 1 refills | Status: DC

## 2018-07-03 MED ORDER — TOPIRAMATE 50 MG PO TABS: ORAL_TABLET | ORAL | 1 refills | Status: DC

## 2018-07-03 MED ORDER — METFORMIN HCL ER 500 MG PO TB24: ORAL_TABLET | ORAL | 1 refills | Status: DC

## 2018-07-03 MED ORDER — SILDENAFIL CITRATE 100 MG PO TABS: ORAL_TABLET | ORAL | 11 refills | Status: DC

## 2018-07-04 ENCOUNTER — Other Ambulatory Visit: Payer: Self-pay | Admitting: Internal Medicine

## 2018-07-04 DIAGNOSIS — Z6841 Body Mass Index (BMI) 40.0 and over, adult: Principal | ICD-10-CM

## 2018-07-04 DIAGNOSIS — E349 Endocrine disorder, unspecified: Secondary | ICD-10-CM

## 2018-07-04 MED ORDER — TESTOSTERONE 20.25 MG/ACT (1.62%) TD GEL: TRANSDERMAL | 1 refills | Status: DC

## 2018-07-04 MED ORDER — PHENTERMINE HCL 37.5 MG PO TABS: ORAL_TABLET | ORAL | 1 refills | Status: DC

## 2018-07-31 ENCOUNTER — Telehealth: Payer: Self-pay | Admitting: *Deleted

## 2018-07-31 NOTE — Telephone Encounter (Signed)
Testosterone Gel approved 07/06/2018 until 05/01/2098.

## 2018-09-12 ENCOUNTER — Ambulatory Visit: Payer: Self-pay | Admitting: Adult Health

## 2018-09-12 ENCOUNTER — Other Ambulatory Visit: Payer: Self-pay

## 2018-09-12 ENCOUNTER — Encounter: Payer: Self-pay | Admitting: Adult Health

## 2018-09-12 ENCOUNTER — Ambulatory Visit (INDEPENDENT_AMBULATORY_CARE_PROVIDER_SITE_OTHER): Admitting: Adult Health

## 2018-09-12 VITALS — BP 120/80 | HR 83 | Temp 97.7°F | Ht 73.5 in | Wt 305.0 lb

## 2018-09-12 DIAGNOSIS — Z79899 Other long term (current) drug therapy: Secondary | ICD-10-CM | POA: Diagnosis not present

## 2018-09-12 DIAGNOSIS — E785 Hyperlipidemia, unspecified: Secondary | ICD-10-CM | POA: Diagnosis not present

## 2018-09-12 DIAGNOSIS — E1122 Type 2 diabetes mellitus with diabetic chronic kidney disease: Secondary | ICD-10-CM

## 2018-09-12 DIAGNOSIS — I10 Essential (primary) hypertension: Secondary | ICD-10-CM

## 2018-09-12 DIAGNOSIS — N183 Chronic kidney disease, stage 3 (moderate): Secondary | ICD-10-CM

## 2018-09-12 DIAGNOSIS — E1169 Type 2 diabetes mellitus with other specified complication: Secondary | ICD-10-CM

## 2018-09-12 DIAGNOSIS — E349 Endocrine disorder, unspecified: Secondary | ICD-10-CM | POA: Diagnosis not present

## 2018-09-12 DIAGNOSIS — E1121 Type 2 diabetes mellitus with diabetic nephropathy: Secondary | ICD-10-CM

## 2018-09-12 DIAGNOSIS — E559 Vitamin D deficiency, unspecified: Secondary | ICD-10-CM

## 2018-09-12 NOTE — Progress Notes (Signed)
FOLLOW UP  Assessment and Plan:   Hypertension Well controlled with current medications  Monitor blood pressure at home; patient to call if consistently greater than 130/80 Continue DASH diet.   Reminder to go to the ER if any CP, SOB, nausea, dizziness, severe HA, changes vision/speech, left arm numbness and tingling and jaw pain.  Cholesterol Currently at LDL goal; strong patient preference to avoid starting statin Continue low cholesterol diet and exercise.  Check lipid panel.   Diabetes with diabetic chronic kidney disease and with other circulatory complications Continue medication: metformin, glimepiride, cinnamon; off of insulin to assist with weight loss Fasting glucose at goal  Continue diet and exercise.  Perform daily foot/skin check, notify office of any concerning changes.  Check A1C  Morbid obesity Long discussion about weight loss, diet, and exercise, risks of chronic obesity Recommended diet heavy in fruits and veggies and low in animal meats, cheeses, and dairy products, appropriate calorie intake Discussed ideal weight for height, he does not want to set a weight goal Patient will work on continuing with portion control, exercise Patient on phentermine with benefit and no SE, taking drug breaks; continue close follow up. Will follow up in 3 months  Vitamin D Def Below goal at last visit; he has increased dose Check Vit D level  Hypogonadism-  continue replacement therapy, check testosterone levels as needed.  Weight loss encouraged   Continue diet and meds as discussed. Further disposition pending results of labs. Discussed med's effects and SE's.   Over 30 minutes of exam, counseling, chart review, and critical decision making was performed.   Future Appointments  Date Time Provider [place]  12/19/2018  4:30 PM Unk Pinto, MD [facility] None  05/01/2019  3:00 PM Vicie Mutters, PA-C [facility] None     ----------------------------------------------------------------------------------------------------------------------  HPI 58 y.o. male  presents for 3 month follow up on hypertension, cholesterol, diabetes, morbid obeisty and vitamin D deficiency. He is followed by Dr. Marin Olp for hemochromatosis and gets phlebotomies PRN.   he is prescribed phentermine for weight loss.  While on the medication they have lost 7 lbs since last visit. They deny palpitations, anxiety, trouble sleeping, elevated BP.   BMI is Body mass index is 39.69 kg/m., he has been working on diet and exercise, has been eating more fruit, limiting portions. He does 1/2 and 1/2 tea, ice water, cut out soda. Watching portions closely. Has been doing GYM sessions 4 days, doing weights 2 hours each, and will be getting back on bike. He estimates 1/2 gallon of fluid daily.  Wt Readings from Last 3 Encounters:  09/12/18 (!) 305 lb (138.3 kg)  06/05/18 (!) 312 lb 12.8 oz (141.9 kg)  04/20/18 (!) 309 lb 3.2 oz (140.3 kg)   His blood pressure has been controlled at home, today their BP is BP: 120/80  He does not workout, but bought a road bike and plans to restart this soon. He denies chest pain, shortness of breath, dizziness.   He is not on cholesterol medication per strong patient preference. His LDL cholesterol is at goal, trigs remain elevated. The cholesterol last visit was:   Lab Results  Component Value Date   CHOL 164 06/05/2018   HDL 55 06/05/2018   LDLCALC 81 06/05/2018   TRIG 187 (H) 06/05/2018   CHOLHDL 3.0 06/05/2018    He has been working on diet and exercise for T2 diabetes (on metformin 2000 mg, amaryl 4 mg BID, cinnamon 1000 mg BID, was tapered off of  insulin to assist with weight loss and due to cost), and denies foot ulcerations, increased appetite, nausea, paresthesia of the feet, polydipsia, polyuria, visual disturbances, vomiting and weight loss. He does check  fasting sugars a few days a week,  120-130.Marland Kitchen Last A1C in the office was:  Lab Results  Component Value Date   HGBA1C 9.0 (H) 06/05/2018   Patient is on Vitamin D supplement.   Lab Results  Component Value Date   VD25OH 38 06/05/2018     He has a history of testosterone deficiency and is on testosterone replacement (topical). He states that the testosterone helps with his energy, libido, muscle mass. Lab Results  Component Value Date   TESTOSTERONE 329 03/23/2018      Current Medications:  Current Outpatient Medications on File Prior to Visit  Medication Sig  . aspirin EC 81 MG tablet Take 81 mg by mouth daily.  . Cholecalciferol (VITAMIN D-3) 5000 UNITS TABS Take 5,000 Units by mouth daily.  . Cinnamon 500 MG TABS Take 2 tablets by mouth 2 (two) times daily.   Marland Kitchen glimepiride (AMARYL) 4 MG tablet Take 1 tablet 2 x   /day with meals for Diabetes  . glucose blood (CONTOUR NEXT TEST) test strip Check blood sugar 1 time a day-[id number]  . lisinopril (PRINIVIL,ZESTRIL) 20 MG tablet Take 1 tablet (20 mg total) by mouth daily.  . metFORMIN (GLUCOPHAGE-XR) 500 MG 24 hr tablet TAKE 4 TABLETS (2,000 MG TOTAL) BY MOUTH DAILY.  Marland Kitchen phentermine (ADIPEX-P) 37.5 MG tablet Take 1/2 to 1 tablet every morning for dieting & weight  loss  . sildenafil (VIAGRA) 100 MG tablet TAKE 1/2 TO 1 TABLET ONCE DAILY AS NEEDED  . Testosterone 20.25 MG/ACT (1.62%) GEL Apply 4 pumps Daily  . topiramate (TOPAMAX) 50 MG tablet Take 1 tablet 2 x /day at Suppertime & Bedtime for Weight Loss   No current facility-administered medications on file prior to visit.      Allergies: No Known Allergies   Medical History:  Past Medical History:  Diagnosis Date  . Erectile dysfunction 09/11/2014  . History of kidney stones   . Hypogonadism male   . Left ureteral stone   . OSA on CPAP   . Right bundle branch block   . Sleep apnea   . Type 2 diabetes mellitus (HCC)    Family history- Reviewed and unchanged Social history- Reviewed and  unchanged   Review of Systems:  Review of Systems  Constitutional: Negative for malaise/fatigue and weight loss.  HENT: Negative for hearing loss and tinnitus.   Eyes: Negative for blurred vision and double vision.  Respiratory: Negative for cough, shortness of breath and wheezing.   Cardiovascular: Negative for chest pain, palpitations, orthopnea, claudication and leg swelling.  Gastrointestinal: Negative for abdominal pain, blood in stool, constipation, diarrhea, heartburn, melena, nausea and vomiting.  Genitourinary: Negative.   Musculoskeletal: Negative for joint pain and myalgias.  Skin: Negative for rash.  Neurological: Negative for dizziness, tingling, sensory change, weakness and headaches.  Endo/Heme/Allergies: Negative for polydipsia.  Psychiatric/Behavioral: Negative.   All other systems reviewed and are negative.     Physical Exam: BP 120/80   Pulse 83   Temp 97.7 F (36.5 C)   Ht 6' 1.5" (1.867 m)   Wt (!) 305 lb (138.3 kg)   SpO2 99%   BMI 39.69 kg/m  Wt Readings from Last 3 Encounters:  09/12/18 (!) 305 lb (138.3 kg)  06/05/18 (!) 312 lb 12.8 oz (141.9 kg)  04/20/18 Marland Kitchen)  309 lb 3.2 oz (140.3 kg)   General Appearance: Well nourished, morbidly obese AA male, in no apparent distress. Eyes: PERRLA, EOMs, conjunctiva no swelling or erythema Sinuses: No Frontal/maxillary tenderness ENT/Mouth: Ext aud canals clear, TMs without erythema, bulging. No erythema, swelling, or exudate on post pharynx.  Tonsils not swollen or erythematous. Hearing normal.  Neck: Supple, thyroid normal.  Respiratory: Respiratory effort normal, BS equal bilaterally without rales, rhonchi, wheezing or stridor.  Cardio: Regularly irregular rhythmn with no MRGs. Brisk peripheral pulses without edema.  Abdomen: Soft, + BS.  Non tender, no guarding, rebound, hernias, masses. Lymphatics: Non tender without lymphadenopathy.  Musculoskeletal: Full ROM, 5/5 strength, Normal gait Skin: Warm, dry  without rashes, lesions, ecchymosis.  Neuro: Cranial nerves intact. No cerebellar symptoms.  Psych: Awake and oriented X 3, normal affect, Insight and Judgment appropriate.    Izora Ribas, NP 2:05 PM [facility]

## 2018-09-12 NOTE — Patient Instructions (Addendum)
Goals    . Fasting Blood Glucose<130    . HEMOGLOBIN A1C < 7.0    . LDL CALC < 70    . Weight (lb) < 300 lb (136.1 kg)          Drink 1/2 your body weight in fluid ounces of water daily; drink a tall glass of water 30 min before meals  Don't eat until you're stuffed- listen to your stomach and eat until you are 80% full   Try eating off of a salad plate; wait 10 min after finishing before going back for seconds  Start by eating the vegetables on your plate; aim for 50% of your meals to be fruits or vegetables  Then eat your protein - lean meats (grass fed if possible), fish, beans, nuts in moderation  Eat your carbs/starch last ONLY if you still are hungry. If you can, stop before finishing it all  Avoid sugar and flour - the closer it looks to it's original form in nature, typically the better it is for you  Splurge in moderation - "assign" days when you get to splurge and have the "bad stuff" - I like to follow a 80% - 20% plan- "good" choices 80 % of the time, "bad" choices in moderation 20% of the time  Simple equation is: Calories out > calories in = weight loss - even if you eat the bad stuff, if you limit portions, you will still lose weight       When it comes to diets, agreement about the perfect plan isn't easy to find, even among the experts. Experts at the [employer] developed an idea known as the Healthy Eating Plate. Just imagine a plate divided into logical, healthy portions.  The emphasis is on diet quality:  Load up on vegetables and fruits - one-half of your plate: Aim for color and variety, and remember that potatoes don't count.  Go for whole grains - one-quarter of your plate: Whole wheat, barley, wheat berries, quinoa, oats, brown rice, and foods made with them. If you want pasta, go with whole wheat pasta.  Protein power - one-quarter of your plate: Fish, chicken, beans, and nuts are all healthy, versatile protein sources. Limit  red meat.  The diet, however, does go beyond the plate, offering a few other suggestions.  Use healthy plant oils, such as olive, canola, soy, corn, sunflower and peanut. Check the labels, and avoid partially hydrogenated oil, which have unhealthy trans fats.  If you're thirsty, drink water. Coffee and tea are good in moderation, but skip sugary drinks and limit milk and dairy products to one or two daily servings.  The type of carbohydrate in the diet is more important than the amount. Some sources of carbohydrates, such as vegetables, fruits, whole grains, and beans-are healthier than others.  Finally, stay active.

## 2018-09-13 ENCOUNTER — Other Ambulatory Visit: Payer: Self-pay | Admitting: Adult Health

## 2018-09-13 LAB — LIPID PANEL
Cholesterol: 147 mg/dL (ref <200)
HDL: 47 mg/dL (ref >=40)
LDL Cholesterol (Calc): 76 mg/dL (calc)
Non-HDL Cholesterol (Calc): 100 mg/dL (calc) (ref <130)
Total CHOL/HDL Ratio: 3.1 (calc) (ref <5.0)
Triglycerides: 147 mg/dL (ref <150)

## 2018-09-13 LAB — CBC WITH DIFFERENTIAL/PLATELET
Absolute Monocytes: 283 cells/uL (ref 200–950)
Basophils Absolute: 29 cells/uL (ref 0–200)
Basophils Relative: 0.6 %
Eosinophils Absolute: 163 cells/uL (ref 15–500)
Eosinophils Relative: 3.4 %
HCT: 47.8 % (ref 38.5–50.0)
Hemoglobin: 16.2 g/dL (ref 13.2–17.1)
Lymphs Abs: 2194 cells/uL (ref 850–3900)
MCH: 28.9 pg (ref 27.0–33.0)
MCHC: 33.9 g/dL (ref 32.0–36.0)
MCV: 85.2 fL (ref 80.0–100.0)
MPV: 12 fL (ref 7.5–12.5)
Monocytes Relative: 5.9 %
Neutro Abs: 2131 cells/uL (ref 1500–7800)
Neutrophils Relative %: 44.4 %
Platelets: 222 10*3/uL (ref 140–400)
RBC: 5.61 10*6/uL (ref 4.20–5.80)
RDW: 12.5 % (ref 11.0–15.0)
Total Lymphocyte: 45.7 %
WBC: 4.8 10*3/uL (ref 3.8–10.8)

## 2018-09-13 LAB — HEMOGLOBIN A1C
Hgb A1c MFr Bld: 9.7 % of total Hgb — ABNORMAL HIGH (ref <5.7)
Mean Plasma Glucose: 232 (calc)
eAG (mmol/L): 12.8 (calc)

## 2018-09-13 LAB — COMPLETE METABOLIC PANEL WITH GFR
AG Ratio: 1.7 (calc) (ref 1.0–2.5)
ALT: 30 U/L (ref 9–46)
AST: 15 U/L (ref 10–35)
Albumin: 4.3 g/dL (ref 3.6–5.1)
Alkaline phosphatase (APISO): 151 U/L — ABNORMAL HIGH (ref 35–144)
BUN/Creatinine Ratio: 11 (calc) (ref 6–22)
BUN: 15 mg/dL (ref 7–25)
CO2: 28 mmol/L (ref 20–32)
Calcium: 9.8 mg/dL (ref 8.6–10.3)
Chloride: 104 mmol/L (ref 98–110)
Creat: 1.4 mg/dL — ABNORMAL HIGH (ref 0.70–1.33)
GFR, Est African American: 65 mL/min/{1.73_m2} (ref >=60)
GFR, Est Non African American: 56 mL/min/{1.73_m2} — ABNORMAL LOW (ref >=60)
Globulin: 2.5 g/dL (calc) (ref 1.9–3.7)
Glucose, Bld: 313 mg/dL — ABNORMAL HIGH (ref 65–99)
Potassium: 4.8 mmol/L (ref 3.5–5.3)
Sodium: 140 mmol/L (ref 135–146)
Total Bilirubin: 1.2 mg/dL (ref 0.2–1.2)
Total Protein: 6.8 g/dL (ref 6.1–8.1)

## 2018-09-13 LAB — TSH: TSH: 1.02 mIU/L (ref 0.40–4.50)

## 2018-09-13 LAB — VITAMIN D 25 HYDROXY (VIT D DEFICIENCY, FRACTURES): Vit D, 25-Hydroxy: 46 ng/mL (ref 30–100)

## 2018-09-13 LAB — MAGNESIUM: Magnesium: 1.9 mg/dL (ref 1.5–2.5)

## 2018-09-13 MED ORDER — VITAMIN D-3 125 MCG (5000 UT) PO TABS: 15000.0000 [IU] | ORAL_TABLET | Freq: Every day | ORAL | 0 refills | Status: AC

## 2018-11-22 ENCOUNTER — Other Ambulatory Visit: Payer: Self-pay | Admitting: Internal Medicine

## 2018-11-26 ENCOUNTER — Other Ambulatory Visit: Payer: Self-pay | Admitting: *Deleted

## 2018-11-26 ENCOUNTER — Telehealth: Payer: Self-pay | Admitting: *Deleted

## 2018-11-26 ENCOUNTER — Other Ambulatory Visit: Payer: Self-pay | Admitting: Internal Medicine

## 2018-11-26 MED ORDER — INSULIN GLARGINE 100 UNITS/ML SOLOSTAR PEN: PEN_INJECTOR | SUBCUTANEOUS | 1 refills | Status: DC

## 2018-11-26 MED ORDER — SOLIQUA 100-33 UNT-MCG/ML ~~LOC~~ SOPN: 40.0000 mL | PEN_INJECTOR | Freq: Every day | SUBCUTANEOUS | 2 refills | Status: DC

## 2018-11-26 MED ORDER — LANTUS SOLOSTAR 100 UNIT/ML ~~LOC~~ SOPN: PEN_INJECTOR | SUBCUTANEOUS | 2 refills | Status: DC

## 2018-11-26 MED ORDER — FREESTYLE LANCETS MISC: 12 refills | Status: DC

## 2018-11-26 MED ORDER — CONTOUR NEXT TEST VI STRP: ORAL_STRIP | 1 refills | Status: DC

## 2018-11-26 MED ORDER — FREESTYLE SYSTEM KIT: PACK | 0 refills | Status: AC

## 2018-11-26 MED ORDER — SOLIQUA 100-33 UNT-MCG/ML ~~LOC~~ SOPN: PEN_INJECTOR | SUBCUTANEOUS | 2 refills | Status: DC

## 2018-11-26 MED ORDER — FREESTYLE LITE TEST VI STRP: ORAL_STRIP | 12 refills | Status: AC

## 2018-11-26 MED ORDER — BAYER MICROLET LANCETS MISC: 12 refills | Status: DC

## 2018-11-26 NOTE — Telephone Encounter (Signed)
error 

## 2018-11-26 NOTE — Telephone Encounter (Signed)
Patient called and reported his glucose reading are in the 400's and would like to return to taking Soliqua. The RX was sent to [facility] and the patient was advised the best way to reduce his glucose, is to change his diet, per Dr Melford Aase.

## 2018-11-27 ENCOUNTER — Telehealth: Payer: Self-pay | Admitting: *Deleted

## 2018-11-27 NOTE — Telephone Encounter (Signed)
A message was left to inform the patient, the RX for Carlos [name] was not covered on his plan, per a fax received from [place] was sent in for [place], by Dr Melford Aase.

## 2018-12-18 ENCOUNTER — Encounter: Payer: Self-pay | Admitting: Internal Medicine

## 2018-12-18 NOTE — Patient Instructions (Signed)

## 2018-12-18 NOTE — Progress Notes (Signed)
History of Present Illness:      This very nice 58 y.o.  Carlos [name] presents for 6 month follow up with HTN, HLD, T2_DM/CKD3  and Vitamin D Deficiency. Patient is followed by Dr Marin Olp for Hemochromatosis. Patient also has Testosterone deficiency & is on replacement with improved sense of well being. He is on CPAP{ for OSAwith improved sleep hygiene.      Patient is treated for HTN (2005) & BP has been controlled at home. Today's BP is at goal - 122/86. Patient has had no complaints of any cardiac type chest pain, palpitations, dyspnea / orthopnea / PND, dizziness, claudication, or dependent edema.      Hyperlipidemia is controlled with diet & meds. Patient denies myalgias or other med SE's. Last Lipids were at goal: Lab Results  Component Value Date   CHOL 147 09/12/2018   HDL 47 09/12/2018   LDLCALC 76 09/12/2018   TRIG 147 09/12/2018   CHOLHDL 3.1 09/12/2018       Also, the patient has Morbid Obesity (BMI Carlos.4+) and history of T2_NIDDM (2005) w/CKD3 and has had no symptoms of reactive hypoglycemia, diabetic polys, paresthesias or visual blurring.   Since last seen, patient has requested to restart basal insulin for better control of his blood sugars.  He is on Metformin, Glipizide & Lantus (40 units /day). Last A1c was not at goal: Lab Results  Component Value Date   HGBA1C 9.7 (H) 09/12/2018   Wt Readings from Last 3 Encounters:  12/19/18 (!) 302 lb 12.8 oz (137.3 kg)  09/12/18 (!) 305 lb (138.3 kg)  06/05/18 (!) 312 lb 12.8 oz (141.9 kg)       Further, the patient also has history of Vitamin D Deficiency and supplements vitamin D without any suspecte side-effects. Last vitamin D was still low (goal 70-100): Lab Results  Component Value Date   VD25OH 46 09/12/2018   Current Outpatient Medications on File Prior to Visit  Medication Sig  . aspirin EC 81 MG tablet Take 81 mg by mouth daily.  . Cholecalciferol (VITAMIN D-3) 125 MCG (5000 UT) TABS Take 15,000 Units by mouth daily.  (Patient taking differently: Take 10,000 Units by mouth daily. )  . CINNAMON PO Take 1,000 mg by mouth. Takes 2 tablets twice a day.  Marland Kitchen glimepiride (AMARYL) 4 MG tablet Take 1 tablet 2 x/day with meals for Diabetes  . glucose blood (FREESTYLE LITE) test strip CHECK BLOOD SUGAR 1 TIME A DAY-dx-E11.29.  . glucose monitoring kit (FREESTYLE) monitoring kit Check blood sugar 1 time a day-DX-E11.29  . Insulin Glargine (LANTUS SOLOSTAR) 100 UNIT/ML Solostar Pen Inject 30 t0 40 units Daily for Diabetes  . Lancets (FREESTYLE) lancets CHECK BLOOD SUGAR 1 TIME A DAY-DX-E11.29.  Marland Kitchen lisinopril (PRINIVIL,ZESTRIL) 20 MG tablet Take 1 tablet (20 mg total) by mouth daily.  . metFORMIN (GLUCOPHAGE-XR) 500 MG 24 hr tablet TAKE 4 TABLETS (2,000 MG TOTAL) BY MOUTH DAILY.  Marland Kitchen phentermine (ADIPEX-P) 37.5 MG tablet Take 1/2 to 1 tablet every morning for dieting & weight  loss  . sildenafil (VIAGRA) 100 MG tablet TAKE 1/2 TO 1 TABLET ONCE DAILY AS NEEDED  . Testosterone 20.25 MG/ACT (1.62%) GEL Apply 4 pumps Daily  . topiramate (TOPAMAX) 50 MG tablet Take 1 tablet 2 x /day at Suppertime & Bedtime for Weight Loss   No current facility-administered medications on file prior to visit.    No Known Allergies  PMHx:   Past Medical History:  Diagnosis Date  .  Erectile dysfunction 09/11/2014  . History of kidney stones   . Hypogonadism male   . Left ureteral stone   . OSA on CPAP   . Right bundle branch block   . Sleep apnea   . Type 2 diabetes mellitus ([place])    Immunization History  Administered Date(s) Administered  . DTaP 04/02/2012  . Influenza Inj Mdck Quad With Preservative 03/20/2018  . Influenza Split 02/17/2014  . Influenza-Unspecified 01/13/2017  . PPD Test 06/05/2018  . Pneumococcal Polysaccharide-23 04/02/2012   Past Surgical History:  Procedure Laterality Date  . ANKLE SURGERY Left 2002  . CYSTOSCOPY W/ RETROGRADES Left 12/05/2014   Procedure: CYSTOSCOPY WITH RETROGRADE PYELOGRAM;  Surgeon:  Ardis Hughs, MD;  Location: [facility];  Service: Urology;  Laterality: Left;  . CYSTOSCOPY/RETROGRADE/URETEROSCOPY/STONE EXTRACTION WITH BASKET Left 12/05/2014   Procedure: LEFT  URETEROSCOPY, STONE EXTRACTION ;  Surgeon: Ardis Hughs, MD;  Location: [facility];  Service: Urology;  Laterality: Left;  . EXTRACORPOREAL SHOCK WAVE LITHOTRIPSY Left 09-25-2014  . RHINOPLASTY  2001   FHx:    Reviewed / unchanged  SHx:    Reviewed / unchanged   Systems Review:  Constitutional: Denies fever, chills, wt changes, headaches, insomnia, fatigue, night sweats, change in appetite. Eyes: Denies redness, blurred vision, diplopia, discharge, itchy, watery eyes.  ENT: Denies discharge, congestion, post nasal drip, epistaxis, sore throat, earache, hearing loss, dental pain, tinnitus, vertigo, sinus pain, snoring.  CV: Denies chest pain, palpitations, irregular heartbeat, syncope, dyspnea, diaphoresis, orthopnea, PND, claudication or edema. Respiratory: denies cough, dyspnea, DOE, pleurisy, hoarseness, laryngitis, wheezing.  Gastrointestinal: Denies dysphagia, odynophagia, heartburn, reflux, water brash, abdominal pain or cramps, nausea, vomiting, bloating, diarrhea, constipation, hematemesis, melena, hematochezia  or hemorrhoids. Genitourinary: Denies dysuria, frequency, urgency, nocturia, hesitancy, discharge, hematuria or flank pain. Musculoskeletal: Denies arthralgias, myalgias, stiffness, jt. swelling, pain, limping or strain/sprain.  Skin: Denies pruritus, rash, hives, warts, acne, eczema or change in skin lesion(s). Neuro: No weakness, tremor, incoordination, spasms, paresthesia or pain. Psychiatric: Denies confusion, memory loss or sensory loss. Endo: Denies change in weight, skin or hair change.  Heme/Lymph: No excessive bleeding, bruising or enlarged lymph nodes.  Physical Exam  BP 122/86   Pulse 80   Temp (!) 97 F (36.1 C)   Resp 16   Ht 6' 1.5"  (1.867 m)   Wt (!) 302 lb 12.8 oz (137.3 kg)   BMI Carlos.41 kg/m   Appears  Over nourished, well groomed  and in no distress.  Eyes: PERRLA, EOMs, conjunctiva no swelling or erythema. Sinuses: No frontal/maxillary tenderness ENT/Mouth: EAC's clear, TM's nl w/o erythema, bulging. Nares clear w/o erythema, swelling, exudates. Oropharynx clear without erythema or exudates. Oral hygiene is good. Tongue normal, non obstructing. Hearing intact.  Neck: Supple. Thyroid not palpable. Car 2+/2+ without bruits, nodes or JVD. Chest: Respirations nl with BS clear & equal w/o rales, rhonchi, wheezing or stridor.  Cor: Heart sounds normal w/ regular rate and rhythm without sig. murmurs, gallops, clicks or rubs. Peripheral pulses normal and equal  without edema.  Abdomen: Soft & bowel sounds normal. Non-tender w/o guarding, rebound, hernias, masses or organomegaly.  Lymphatics: Unremarkable.  Musculoskeletal: Full ROM all peripheral extremities, joint stability, 5/5 strength and normal gait.  Skin: Warm, dry without exposed rashes, lesions or ecchymosis apparent.  Neuro: Cranial nerves intact, reflexes equal bilaterally. Sensory-motor testing grossly intact. Tendon reflexes grossly intact.  Pysch: Alert & oriented x 3.  Insight and judgement nl & appropriate. No ideations.  Assessment and Plan:  1. Essential hypertension  - Continue medication, monitor blood pressure at home.  - Continue DASH diet.  Reminder to go to the ER if any CP,  SOB, nausea, dizziness, severe HA, changes vision/speech.  - CBC with Differential/Platelet - COMPLETE METABOLIC PANEL WITH GFR - Magnesium - TSH  2. Hyperlipidemia, mixed  - Continue diet/meds, exercise,& lifestyle modifications.  - Continue monitor periodic cholesterol/liver & renal functions   - Lipid panel - TSH  3. Type 2 diabetes mellitus with stage 3 chronic kidney disease, with long-term current use of insulin (HCC)  - Continue diet, exercise  -  Lifestyle modifications.  - Monitor appropriate labs.  - Hemoglobin A1c  4. Vitamin D deficiency  - Continue supplementation.  - VITAMIN D 25 Hydroxyl  5. Testosterone deficiency  - Testosterone  6. Morbid obesity ([place])   7. Medication management  - CBC with Differential/Platelet - COMPLETE METABOLIC PANEL WITH GFR - Magnesium - Lipid panel - TSH - Hemoglobin A1c - VITAMIN D 25 Hydroxyl - Testosterone     Discussed  regular exercise, BP monitoring, weight control to achieve/maintain BMI less than 25 and discussed med and SE's. Recommended labs to assess and monitor clinical status with further disposition pending results of labs.  I discussed the assessment and treatment plan with the patient. The patient was provided an opportunity to ask questions and all were answered. The patient agreed with the plan and demonstrated an understanding of the instructions.  I provided over 30 minutes of exam, counseling, chart review and  complex critical decision making.  Kirtland Bouchard, MD

## 2018-12-19 ENCOUNTER — Ambulatory Visit (INDEPENDENT_AMBULATORY_CARE_PROVIDER_SITE_OTHER): Admitting: Internal Medicine

## 2018-12-19 ENCOUNTER — Other Ambulatory Visit: Payer: Self-pay

## 2018-12-19 VITALS — BP 122/86 | HR 80 | Temp 97.0°F | Resp 16 | Ht 73.5 in | Wt 302.8 lb

## 2018-12-19 DIAGNOSIS — E1122 Type 2 diabetes mellitus with diabetic chronic kidney disease: Secondary | ICD-10-CM

## 2018-12-19 DIAGNOSIS — E559 Vitamin D deficiency, unspecified: Secondary | ICD-10-CM

## 2018-12-19 DIAGNOSIS — Z794 Long term (current) use of insulin: Secondary | ICD-10-CM

## 2018-12-19 DIAGNOSIS — Z79899 Other long term (current) drug therapy: Secondary | ICD-10-CM

## 2018-12-19 DIAGNOSIS — E782 Mixed hyperlipidemia: Secondary | ICD-10-CM | POA: Diagnosis not present

## 2018-12-19 DIAGNOSIS — I10 Essential (primary) hypertension: Secondary | ICD-10-CM

## 2018-12-19 DIAGNOSIS — E349 Endocrine disorder, unspecified: Secondary | ICD-10-CM

## 2018-12-19 DIAGNOSIS — N183 Chronic kidney disease, stage 3 (moderate): Secondary | ICD-10-CM

## 2018-12-20 LAB — COMPLETE METABOLIC PANEL WITH GFR
AG Ratio: 1.8 (calc) (ref 1.0–2.5)
ALT: 27 U/L (ref 9–46)
AST: 15 U/L (ref 10–35)
Albumin: 4.1 g/dL (ref 3.6–5.1)
Alkaline phosphatase (APISO): 154 U/L — ABNORMAL HIGH (ref 35–144)
BUN/Creatinine Ratio: 11 (calc) (ref 6–22)
BUN: 15 mg/dL (ref 7–25)
CO2: 27 mmol/L (ref 20–32)
Calcium: 9.9 mg/dL (ref 8.6–10.3)
Chloride: 107 mmol/L (ref 98–110)
Creat: 1.37 mg/dL — ABNORMAL HIGH (ref 0.70–1.33)
GFR, Est African American: 67 mL/min/{1.73_m2} (ref >=60)
GFR, Est Non African American: 58 mL/min/{1.73_m2} — ABNORMAL LOW (ref >=60)
Globulin: 2.3 g/dL (calc) (ref 1.9–3.7)
Glucose, Bld: 211 mg/dL — ABNORMAL HIGH (ref 65–99)
Potassium: 4.3 mmol/L (ref 3.5–5.3)
Sodium: 142 mmol/L (ref 135–146)
Total Bilirubin: 0.9 mg/dL (ref 0.2–1.2)
Total Protein: 6.4 g/dL (ref 6.1–8.1)

## 2018-12-20 LAB — CBC WITH DIFFERENTIAL/PLATELET
Absolute Monocytes: 337 cells/uL (ref 200–950)
Basophils Absolute: 41 cells/uL (ref 0–200)
Basophils Relative: 1.1 %
Eosinophils Absolute: 130 cells/uL (ref 15–500)
Eosinophils Relative: 3.5 %
HCT: 45.5 % (ref 38.5–50.0)
Hemoglobin: 15 g/dL (ref 13.2–17.1)
Lymphs Abs: 1495 cells/uL (ref 850–3900)
MCH: 28 pg (ref 27.0–33.0)
MCHC: 33 g/dL (ref 32.0–36.0)
MCV: 85 fL (ref 80.0–100.0)
MPV: 11.6 fL (ref 7.5–12.5)
Monocytes Relative: 9.1 %
Neutro Abs: 1698 cells/uL (ref 1500–7800)
Neutrophils Relative %: 45.9 %
Platelets: 210 10*3/uL (ref 140–400)
RBC: 5.35 10*6/uL (ref 4.20–5.80)
RDW: 12.8 % (ref 11.0–15.0)
Total Lymphocyte: 40.4 %
WBC: 3.7 10*3/uL — ABNORMAL LOW (ref 3.8–10.8)

## 2018-12-20 LAB — TESTOSTERONE: Testosterone: 226 ng/dL — ABNORMAL LOW (ref 250–827)

## 2018-12-20 LAB — LIPID PANEL
Cholesterol: 141 mg/dL (ref <200)
HDL: 57 mg/dL (ref >=40)
LDL Cholesterol (Calc): 63 mg/dL (calc)
Non-HDL Cholesterol (Calc): 84 mg/dL (calc) (ref <130)
Total CHOL/HDL Ratio: 2.5 (calc) (ref <5.0)
Triglycerides: 129 mg/dL (ref <150)

## 2018-12-20 LAB — MAGNESIUM: Magnesium: 1.8 mg/dL (ref 1.5–2.5)

## 2018-12-20 LAB — HEMOGLOBIN A1C
Hgb A1c MFr Bld: 13.6 % of total Hgb — ABNORMAL HIGH (ref <5.7)
Mean Plasma Glucose: 344 (calc)
eAG (mmol/L): 19 (calc)

## 2018-12-20 LAB — TSH: TSH: 1.19 mIU/L (ref 0.40–4.50)

## 2018-12-20 LAB — VITAMIN D 25 HYDROXY (VIT D DEFICIENCY, FRACTURES): Vit D, 25-Hydroxy: 46 ng/mL (ref 30–100)

## 2019-02-01 LAB — HM DIABETES EYE EXAM

## 2019-02-21 ENCOUNTER — Encounter: Payer: Self-pay | Admitting: Internal Medicine

## 2019-02-26 ENCOUNTER — Other Ambulatory Visit: Payer: Self-pay | Admitting: Internal Medicine

## 2019-02-26 MED ORDER — LANTUS SOLOSTAR 100 UNIT/ML ~~LOC~~ SOPN: PEN_INJECTOR | SUBCUTANEOUS | 3 refills | Status: DC

## 2019-03-11 ENCOUNTER — Other Ambulatory Visit: Payer: Self-pay | Admitting: Internal Medicine

## 2019-03-11 DIAGNOSIS — E349 Endocrine disorder, unspecified: Secondary | ICD-10-CM

## 2019-03-11 MED ORDER — TESTOSTERONE 20.25 MG/ACT (1.62%) TD GEL: TRANSDERMAL | 1 refills | Status: DC

## 2019-04-29 DIAGNOSIS — E1169 Type 2 diabetes mellitus with other specified complication: Secondary | ICD-10-CM | POA: Insufficient documentation

## 2019-04-29 NOTE — Progress Notes (Signed)
Complete Physical  Assessment and Plan:   Encounter for general adult medical examination with abnormal findings -     CBC with Diff -     COMPLETE METABOLIC PANEL WITH GFR -     TSH -     Lipid Profile -     Hemoglobin A1c ([facility]) -     Magnesium -     Vitamin D (25 hydroxy) -     Urinalysis, Routine w reflex microscopic -     Microalbumin / Creatinine Urine Ratio -     EKG 12-Lead -     [place], retroperitnl abd,  ltd -     Testosterone, Total -     PSA  Type 2 diabetes mellitus with diabetic nephropathy, without long-term current use of insulin (HCC) -     Hemoglobin A1c ([facility]) May add on GLP if covered, if not will switch to 70/30 Needs to start checking sugars.   Hyperlipidemia associated with type 2 diabetes mellitus (HCC) -     Lipid Profile check lipids decrease fatty foods increase activity.   Morbid obesity ([place]) - follow up 3 months for progress monitoring - increase veggies, decrease carbs - long discussion about weight loss, diet, and exercise  CKD stage 3 due to type 2 diabetes mellitus ([place]) Discussed general issues about diabetes pathophysiology and management., Educational material distributed., Suggested low cholesterol diet., Encouraged aerobic exercise., Discussed foot care., Reminded to get yearly retinal exam. May add on GLP if covered, if not will switch to 70/30 Needs to start checking sugars.   Type 2 diabetes mellitus with hyperlipidemia (HCC) check lipids decrease fatty foods increase activity.   Erectile dysfunction associated with type 2 diabetes mellitus ([place]) Need to get better control of sugars  Hereditary hemochromatosis ([place]) Monitor labs, need to add on iron/ferritin  Essential hypertension - continue medications, DASH diet, exercise and monitor at home. Call if greater than 130/80.  -     CBC with Diff -     COMPLETE METABOLIC PANEL WITH GFR -     TSH -     Urinalysis, Routine w reflex microscopic -     Microalbumin /  Creatinine Urine Ratio -     EKG 12-Lead -     [place], retroperitnl abd,  ltd  OSA on CPAP Sleep apnea- continue CPAP, CPAP is helping with daytime fatigue, weight loss still advised.   Medication management -     Magnesium  Vitamin D deficiency -     Vitamin D (25 hydroxy)  Testosterone deficiency -     Testosterone, Total  History of nephrolithiasis Increase fluids  Prostate cancer screening -     PSA  Discussed med's effects and SE's. Screening labs and tests as requested with regular follow-up as recommended.   HPI 58 y.o. male  presents for a complete physical. His blood pressure is not checked at home, he is on lisinopril 20 mg, today their BP is BP: (!) 154/74 He does not workout. Marland Kitchen He denies chest pain, shortness of breath, dizziness.  BMI is Body mass index is 41.92 kg/m., he is working on diet and exercise. He is on phentermine and topamax for weight loss x 6 months, feels that it is helping.  Has OSA and is on CPAP. Wt Readings from Last 3 Encounters:  05/01/19 (!) 313 lb 6.4 oz (142.2 kg)  12/19/18 (!) 302 lb 12.8 oz (137.3 kg)  09/12/18 (!) 305 lb (138.3 kg)   He has been working  on diet and exercise for diabetes  with CKD he is on ACE Hyperlipidemia not statin, cholesterol IS at goal less than 70 ED, has viagra He is on ASA He is on lantus 40 units He is on amaryl 4 mg 1 pill BID MF 2000 mg a day total Eye exam: 6-8 weeks ago, Dr. Sabra Heck Meter: Freestyle has been running well at home in the morning, 130's denies paresthesia of the feet, polydipsia and polyuria.  Last A1C in the office was:  Lab Results  Component Value Date   HGBA1C 13.6 (H) 12/19/2018   Lab Results  Component Value Date   GFRAA 67 12/19/2018   Lab Results  Component Value Date   CHOL 141 12/19/2018   HDL 57 12/19/2018   LDLCALC 63 12/19/2018   TRIG 129 12/19/2018   CHOLHDL 2.5 12/19/2018   Patient is on Vitamin D supplement, increase to 10000 IU daily last visit.  Lab  Results  Component Value Date   VD25OH 3 12/19/2018   He is has a history of testosterone deficiency and is on testosterone replacement at this time, only on 2 pumps each arm a day He states that the testosterone is help with his energy, libido, muscle mass.  Lab Results  Component Value Date   TESTOSTERONE 226 (L) 12/19/2018   Has history of kidney stone, follows with Dr. Louis Meckel.  Current Medications:   Current Outpatient Medications (Endocrine & Metabolic):  .  glimepiride (AMARYL) 4 MG tablet, Take 1 tablet 2 x/day with meals for Diabetes .  Insulin Glargine (LANTUS SOLOSTAR) 100 UNIT/ML Solostar Pen, Inject 30 t0 40 units Daily for Diabetes .  metFORMIN (GLUCOPHAGE-XR) 500 MG 24 hr tablet, TAKE 4 TABLETS (2,000 MG TOTAL) BY MOUTH DAILY. Marland Kitchen  Testosterone 20.25 MG/ACT (1.62%) GEL, Apply 4 pumps Daily  Current Outpatient Medications (Cardiovascular):  .  lisinopril (PRINIVIL,ZESTRIL) 20 MG tablet, Take 1 tablet (20 mg total) by mouth daily. .  sildenafil (VIAGRA) 100 MG tablet, TAKE 1/2 TO 1 TABLET ONCE DAILY AS NEEDED   Current Outpatient Medications (Analgesics):  .  aspirin EC 81 MG tablet, Take 81 mg by mouth daily.   Current Outpatient Medications (Other):  Marland Kitchen  Cholecalciferol (VITAMIN D-3) 125 MCG (5000 UT) TABS, Take 15,000 Units by mouth daily. (Patient taking differently: Take by mouth 2 (two) times daily. ) .  CINNAMON PO, Take 1,000 mg by mouth. Takes 2 tablets twice a day. Marland Kitchen  glucose blood (FREESTYLE LITE) test strip, CHECK BLOOD SUGAR 1 TIME A DAY-dx-E11.29. .  glucose monitoring kit (FREESTYLE) monitoring kit, Check blood sugar 1 time a day-DX-E11.29 .  Lancets (FREESTYLE) lancets, CHECK BLOOD SUGAR 1 TIME A DAY-DX-E11.29. Marland Kitchen  phentermine (ADIPEX-P) 37.5 MG tablet, Take 1/2 to 1 tablet every morning for dieting & weight  loss .  topiramate (TOPAMAX) 50 MG tablet, Take 1 tablet 2 x /day at Suppertime & Bedtime for Weight Loss  Health Maintenance:  Immunization  History  Administered Date(s) Administered  . DTaP 04/02/2012  . Influenza Inj Mdck Quad With Preservative 03/20/2018  . Influenza Split 02/17/2014  . Influenza-Unspecified 01/13/2017  . PPD Test 06/05/2018  . Pneumococcal Polysaccharide-23 04/02/2012   Tetanus: 2013 Pneumovax: 2013 Prevnar 13: due age 75 Flu vaccine: 2019 suggest getting it Zostavax: N/A  DEXA: N/A Colonoscopy: 01/05/2017  EGD: N/A  Eye exam 2020, Dr. Sabra Heck  Dentist: Narda Amber smiles Urologist: Dr. Louis Meckel Endocrinologist: Dr. Cruzita Lederer- has seen in the past, no longer seeing  Medical History:  Past Medical History:  Diagnosis Date  . Erectile dysfunction 09/11/2014  . History of kidney stones   . Hypogonadism male   . Left ureteral stone   . OSA on CPAP   . Right bundle branch block   . Sleep apnea   . Type 2 diabetes mellitus (HCC)    Allergies No Known Allergies  SURGICAL HISTORY He  has a past surgical history that includes Ankle surgery (Left, 2002); Rhinoplasty (2001); Extracorporeal shock wave lithotripsy (Left, 09-25-2014); Cystoscopy/retrograde/ureteroscopy/stone extraction with basket (Left, 12/05/2014); and Cystoscopy w/ retrogrades (Left, 12/05/2014).   FAMILY HISTORY His family history includes Cancer in his mother; Diabetes in his father; Hypertension in his mother.   SOCIAL HISTORY He  reports that he has never smoked. He has never used smokeless tobacco. He reports current alcohol use. He reports that he does not use drugs.   Review of Systems  Constitutional: Positive for malaise/fatigue. Negative for chills, diaphoresis, fever and weight loss.  HENT: Negative.   Eyes: Negative.   Respiratory: Negative.  Negative for shortness of breath.   Cardiovascular: Negative.  Negative for chest pain and claudication.  Gastrointestinal: Negative.  Negative for abdominal pain, blood in stool, constipation, diarrhea and melena.  Genitourinary: Negative.   Musculoskeletal: Negative.   Skin:  Negative.   Neurological: Negative.  Negative for weakness.  Psychiatric/Behavioral: Negative.    Physical Exam: Estimated body mass index is 41.92 kg/m as calculated from the following:   Height as of this encounter: 6' 0.5" (1.842 m).   Weight as of this encounter: 313 lb 6.4 oz (142.2 kg). BP (!) 154/74   Pulse 76   Temp (!) 97.2 F (36.2 C)   Resp 18   Ht 6' 0.5" (1.842 m)   Wt (!) 313 lb 6.4 oz (142.2 kg)   BMI 41.92 kg/m  General Appearance: Well nourished, in no apparent distress. Eyes: PERRLA, EOMs, conjunctiva no swelling or erythema, normal fundi and vessels. Sinuses: No Frontal/maxillary tenderness ENT/Mouth: Ext aud canals clear after removal in the office, normal light reflex with TMs without erythema after bilateral ears were cleaned in the office, bulging. Good dentition. No erythema, swelling, or exudate on post pharynx. Tonsils not swollen or erythematous. Hearing normal.  Neck: Supple, thyroid normal. No bruits Respiratory: Respiratory effort normal, BS equal bilaterally without rales, rhonchi, wheezing or stridor. Cardio: RRR without murmurs, rubs or gallops. Brisk peripheral pulses without edema.  Chest: symmetric, with normal excursions and percussion. Abdomen: Soft, +BS. Non tender, no guarding, rebound, hernias, masses, or organomegaly. .  Lymphatics: Non tender without lymphadenopathy.  Genitourinary: defer urologist Musculoskeletal: Full ROM all peripheral extremities,5/5 strength, and normal gait.Marland Kitchen  Neuro: Cranial nerves intact, reflexes equal bilaterally. Normal muscle tone, no cerebellar symptoms. Sensation intact.  Psych: Awake and oriented X 3, normal affect, Insight and Judgment appropriate.  Skin: dark nevus left medial foot, will monitor, pictures taken. Warm, dry without rashes, lesions, ecchymosis  EKG: CRBBB, occ PVC, no ST changes AORTA SCAN: defer  Vicie Mutters 3:17 PM

## 2019-05-01 ENCOUNTER — Other Ambulatory Visit: Payer: Self-pay

## 2019-05-01 ENCOUNTER — Ambulatory Visit (INDEPENDENT_AMBULATORY_CARE_PROVIDER_SITE_OTHER): Admitting: Physician Assistant

## 2019-05-01 ENCOUNTER — Encounter: Payer: Self-pay | Admitting: Physician Assistant

## 2019-05-01 VITALS — BP 154/74 | HR 76 | Temp 97.2°F | Resp 18 | Ht 72.5 in | Wt 313.4 lb

## 2019-05-01 DIAGNOSIS — E785 Hyperlipidemia, unspecified: Secondary | ICD-10-CM

## 2019-05-01 DIAGNOSIS — E559 Vitamin D deficiency, unspecified: Secondary | ICD-10-CM | POA: Diagnosis not present

## 2019-05-01 DIAGNOSIS — Z136 Encounter for screening for cardiovascular disorders: Secondary | ICD-10-CM | POA: Diagnosis not present

## 2019-05-01 DIAGNOSIS — Z1389 Encounter for screening for other disorder: Secondary | ICD-10-CM | POA: Diagnosis not present

## 2019-05-01 DIAGNOSIS — Z79899 Other long term (current) drug therapy: Secondary | ICD-10-CM | POA: Diagnosis not present

## 2019-05-01 DIAGNOSIS — E1122 Type 2 diabetes mellitus with diabetic chronic kidney disease: Secondary | ICD-10-CM

## 2019-05-01 DIAGNOSIS — R350 Frequency of micturition: Secondary | ICD-10-CM | POA: Diagnosis not present

## 2019-05-01 DIAGNOSIS — E1169 Type 2 diabetes mellitus with other specified complication: Secondary | ICD-10-CM

## 2019-05-01 DIAGNOSIS — Z1329 Encounter for screening for other suspected endocrine disorder: Secondary | ICD-10-CM

## 2019-05-01 DIAGNOSIS — Z0000 Encounter for general adult medical examination without abnormal findings: Secondary | ICD-10-CM

## 2019-05-01 DIAGNOSIS — I10 Essential (primary) hypertension: Secondary | ICD-10-CM

## 2019-05-01 DIAGNOSIS — Z125 Encounter for screening for malignant neoplasm of prostate: Secondary | ICD-10-CM | POA: Diagnosis not present

## 2019-05-01 DIAGNOSIS — Z131 Encounter for screening for diabetes mellitus: Secondary | ICD-10-CM | POA: Diagnosis not present

## 2019-05-01 DIAGNOSIS — Z0001 Encounter for general adult medical examination with abnormal findings: Secondary | ICD-10-CM

## 2019-05-01 DIAGNOSIS — E1121 Type 2 diabetes mellitus with diabetic nephropathy: Secondary | ICD-10-CM

## 2019-05-01 DIAGNOSIS — E349 Endocrine disorder, unspecified: Secondary | ICD-10-CM

## 2019-05-01 DIAGNOSIS — Z13220 Encounter for screening for lipoid disorders: Secondary | ICD-10-CM | POA: Diagnosis not present

## 2019-05-01 DIAGNOSIS — Z87442 Personal history of urinary calculi: Secondary | ICD-10-CM

## 2019-05-01 DIAGNOSIS — G4733 Obstructive sleep apnea (adult) (pediatric): Secondary | ICD-10-CM

## 2019-05-01 DIAGNOSIS — N401 Enlarged prostate with lower urinary tract symptoms: Secondary | ICD-10-CM | POA: Diagnosis not present

## 2019-05-01 NOTE — Patient Instructions (Addendum)
Use a dropper or use a cap to put peroxide, olive oil,mineral oil or canola oil in the effected ear- 2-3 times a week. Let it soak for 20-30 min then you can take a shower or use a baby bulb with warm water to wash out the ear wax.  Can buy debrox wax removal kit over the counter.  Do not use Qtips  Check with your insurance and see if they cover ozempic- once week injectable- there is a savings card with it.   HYPERTENSION INFORMATION  Monitor your blood pressure at home, please keep a record and bring that in with you to your next office visit.   Go to the ER if any CP, SOB, nausea, dizziness, severe HA, changes vision/speech  Testing/Procedures: HOW TO TAKE YOUR BLOOD PRESSURE:  Rest 5 minutes before taking your blood pressure.  Don't smoke or drink caffeinated beverages for at least 30 minutes before.  Take your blood pressure before (not after) you eat.  Sit comfortably with your back supported and both feet on the floor (don't cross your legs).  Elevate your arm to heart level on a table or a desk.  Use the proper sized cuff. It should fit smoothly and snugly around your bare upper arm. There should be enough room to slip a fingertip under the cuff. The bottom edge of the cuff should be 1 inch above the crease of the elbow.  Due to a recent study, SPRINT, we have changed our goal for the systolic or top blood pressure number. Ideally we want your top number at 120.  In the [facility] Trial, 5000 people were randomized to a goal BP of 120 and 5000 people were randomized to a goal BP of less than 140. The patients with the goal BP at 120 had LESS DEMENTIA, LESS HEART ATTACKS, AND LESS STROKES, AS WELL AS OVERALL DECREASED MORTALITY OR DEATH RATE.   There was another study that showed taking your blood pressure medications at night decrease cardiovascular events.  However if you are on a fluid pill, please take this in the morning.   If you are willing, our goal BP is the top number  of 120.  Your most recent BP: BP: (!) 154/74 rechecked at 140/80  Take your medications faithfully as instructed. Maintain a healthy weight. Get at least 150 minutes of aerobic exercise per week. Minimize salt intake. Minimize alcohol intake  DASH Eating Plan DASH stands for "Dietary Approaches to Stop Hypertension." The DASH eating plan is a healthy eating plan that has been shown to reduce high blood pressure (hypertension). Additional health benefits may include reducing the risk of type 2 diabetes mellitus, heart disease, and stroke. The DASH eating plan may also help with weight loss. WHAT DO I NEED TO KNOW ABOUT THE DASH EATING PLAN? For the DASH eating plan, you will follow these general guidelines:  Choose foods with a percent daily value for sodium of less than 5% (as listed on the food label).  Use salt-free seasonings or herbs instead of table salt or sea salt.  Check with your health care provider or pharmacist before using salt substitutes.  Eat lower-sodium products, often labeled as "lower sodium" or "no salt added."  Eat fresh foods.  Eat more vegetables, fruits, and low-fat dairy products.  Choose whole grains. Look for the word "whole" as the first word in the ingredient list.  Choose fish and skinless chicken or [place] more often than red meat. Limit fish, poultry, and meat to  6 oz (170 g) each day.  Limit sweets, desserts, sugars, and sugary drinks.  Choose heart-healthy fats.  Limit cheese to 1 oz (28 g) per day.  Eat more home-cooked food and less restaurant, buffet, and fast food.  Limit fried foods.  Cook foods using methods other than frying.  Limit canned vegetables. If you do use them, rinse them well to decrease the sodium.  When eating at a restaurant, ask that your food be prepared with less salt, or no salt if possible. WHAT FOODS CAN I EAT? Seek help from a dietitian for individual calorie needs. Grains Whole grain or whole wheat bread.  Brown rice. Whole grain or whole wheat pasta. Quinoa, bulgur, and whole grain cereals. Low-sodium cereals. Corn or whole wheat flour tortillas. Whole grain cornbread. Whole grain crackers. Low-sodium crackers. Vegetables Fresh or frozen vegetables (raw, steamed, roasted, or grilled). Low-sodium or reduced-sodium tomato and vegetable juices. Low-sodium or reduced-sodium tomato sauce and paste. Low-sodium or reduced-sodium canned vegetables.  Fruits All fresh, canned (in natural juice), or frozen fruits. Meat and Other Protein Products Ground beef (85% or leaner), grass-fed beef, or beef trimmed of fat. Skinless chicken or [place]. Ground chicken or [place]. Pork trimmed of fat. All fish and seafood. Eggs. Dried beans, peas, or lentils. Unsalted nuts and seeds. Unsalted canned beans. Dairy Low-fat dairy products, such as skim or 1% milk, 2% or reduced-fat cheeses, low-fat ricotta or cottage cheese, or plain low-fat yogurt. Low-sodium or reduced-sodium cheeses. Fats and Oils Tub margarines without trans fats. Light or reduced-fat mayonnaise and salad dressings (reduced sodium). Avocado. Safflower, olive, or canola oils. Natural peanut or almond butter. Other Unsalted popcorn and pretzels. The items listed above may not be a complete list of recommended foods or beverages. Contact your dietitian for more options. WHAT FOODS ARE NOT RECOMMENDED? Grains White bread. White pasta. White rice. Refined cornbread. Bagels and croissants. Crackers that contain trans fat. Vegetables Creamed or fried vegetables. Vegetables in a cheese sauce. Regular canned vegetables. Regular canned tomato sauce and paste. Regular tomato and vegetable juices. Fruits Dried fruits. Canned fruit in light or heavy syrup. Fruit juice. Meat and Other Protein Products Fatty cuts of meat. Ribs, chicken wings, bacon, sausage, bologna, salami, chitterlings, fatback, hot dogs, bratwurst, and packaged luncheon meats. Salted nuts and seeds.  Canned beans with salt. Dairy Whole or 2% milk, cream, half-and-half, and cream cheese. Whole-fat or sweetened yogurt. Full-fat cheeses or blue cheese. Nondairy creamers and whipped toppings. Processed cheese, cheese spreads, or cheese curds. Condiments Onion and garlic salt, seasoned salt, table salt, and sea salt. Canned and packaged gravies. Worcestershire sauce. Tartar sauce. Barbecue sauce. Teriyaki sauce. Soy sauce, including reduced sodium. Steak sauce. Fish sauce. Oyster sauce. Cocktail sauce. Horseradish. Ketchup and mustard. Meat flavorings and tenderizers. Bouillon cubes. Hot sauce. Tabasco sauce. Marinades. Taco seasonings. Relishes. Fats and Oils Butter, stick margarine, lard, shortening, ghee, and bacon fat. Coconut, palm kernel, or palm oils. Regular salad dressings. Other Pickles and olives. Salted popcorn and pretzels. The items listed above may not be a complete list of foods and beverages to avoid. Contact your dietitian for more information. WHERE CAN I FIND MORE INFORMATION? National Heart, Lung, and Blood Institute: travelstabloid.com Document Released: 04/07/2011 Document Revised: 09/02/2013 Document Reviewed: 02/20/2013 [facility] Patient Information 2015 [place], [place]. This information is not intended to replace advice given to you by your health care provider. Make sure you discuss any questions you have with your health care provider.    Preventing Skin Cancer, Adult  Skin cancer is the most common type of cancer. There are three main types. Squamous cell and basal cell skin cancer are the most common. Melanoma skin cancer is the most dangerous type. Most skin cancers are caused by skin damage from exposure to ultraviolet (UV) light. UV light comes from the sun and from artificial tanning beds. Suntans and sunburns result from exposure to UV light. Skin cancer occurs most often in older people, but it is usually the result of damage done  earlier in life. The tans and sunburns you get at any age can lead to skin cancer in the future. To help prevent this, you can take steps to protect yourself. What actions can I take to protect myself from skin cancer? Many people like to get a tan, especially in the summer or when on vacation. However, tan or burned skin is a sign of skin damage. It increases your risk for skin cancer. To lower your risk: Avoid exposure to UV light   Try to stay out of the sun between 10 a.m. and 4 p.m. whenever possible. This is when the sun is at its strongest. Seek the shade during this time.  Remember that you can also be exposed to UV rays on cloudy or hazy days. Nancy Fetter exposure can be risky year-round, not just in the summer.  Do not use a sunlamp, tanning bed, or tanning booth to get a tan. If you really want a tan, use an artificial tanning lotion.  Avoid getting sunburned. Sunburns are more common on bright sunny days, especially when you are in areas where the sun is reflected off water or snow. Use sunscreen and protective clothing   Always use sunscreen--either a cream, lotion, or spray--when you are out in the sun. Keep sunscreen handy, such as in your gym bag or in your car, so that you will have it when you need it.  Use a sunscreen with a sun protection factor (SPF) of at least 19. Use an SPF of 30 or higher if you are in bright sun, especially when you are out in the snow or on the water.  Make sure your sunscreen protects you from UVA and UVB light.  Use an adequate amount of sunscreen to cover exposed areas of skin. Put it on 30 minutes before you go out. Reapply it every 2 hours or anytime you come out of the water.  When you are out in the sun, wear a broad-brimmed hat and clothing that covers your arms and legs. Wear wraparound sunglasses. Check your skin for changes  Check your skin often from head to toe to look for any changes in the size, color, or shape of any moles or freckles.  Check for any new moles or moles that bleed or become itchy. See your health care provider if you notice changes.  Ask your health care provider about a total skin check. Ask if it should be part of your yearly physical or if you need to see a skin specialist (dermatologist). Take other preventive measures   Avoid exposure to harmful chemicals, such as arsenic. ? Have your home's water tested for arsenic and other chemicals. ? Take protective measures to avoid exposure to chemicals at work.  Do not smoke any tobacco products, such as cigarettes, cigars, pipes, and e-cigarettes. If you need help quitting, ask your health care provider.  Keep your immune system healthy. ? Stay up to date on all vaccines, including the human papillomavirus (HPV) vaccine. ? Eat at  least 5 servings of fruits and vegetables every day. Why are these changes important? About 1 of every 5 people will get skin cancer. The best way to reduce your risk is to avoid skin damage from UV light. If you have teenagers in your house, they should know that just five bad sunburns as a teen could double their risk of skin cancer in the future. If you have younger children, always make sure to protect their skin from the sun. These changes can help reduce your risk of skin cancer, and they will also provide other health benefits, such as the following:  Protecting your skin from the sun can help prevent painful sunburns, sun poisoning, and other skin damage and blemishes. This is especially important if: ? You have pale white skin, freckles, and red hair. ? You burn easily.  Avoiding exposure to harmful chemicals can help prevent damage to other tissues in your body, such as your lungs, and prevent other types of cancer.  Avoiding smoking tobacco can reduce your risk for other types of cancer and other health problems.  Eating a healthy diet is good for your overall health. What can happen if changes are not made? If you do  not make these changes, you will be at higher risk for skin cancer. If you develop skin cancer, the treatments could result in lost time from work and changes in your appearance from scars. The most dangerous type of skin cancer, melanoma, can be deadly if not found early. Where to find support For more support, talk to your primary health care provider or dermatologist. Where to find more information Learn more about skin cancer from:  [facility] [place]: www.skincancer.org/prevention  The Centers for Disease Control and Prevention: FabVets.se  The American Academy of Dermatology: http://jones-macias.info/ Summary  Skin cancer is the most common type of cancer.  Melanoma skin cancer can be deadly if not found early.  Sunburns and tanning increase your risk for skin cancer.  Protecting your skin from UV light is the best way to prevent skin cancer. This information is not intended to replace advice given to you by your health care provider. Make sure you discuss any questions you have with your health care provider. Document Released: 06/12/2017 Document Revised: 08/10/2018 Document Reviewed: 06/12/2017 Elsevier Patient Education  2020 [employer].

## 2019-05-03 LAB — CBC WITH DIFFERENTIAL/PLATELET
Absolute Monocytes: 352 cells/uL (ref 200–950)
Basophils Absolute: 40 cells/uL (ref 0–200)
Basophils Relative: 0.9 %
Eosinophils Absolute: 172 cells/uL (ref 15–500)
Eosinophils Relative: 3.9 %
HCT: 48.3 % (ref 38.5–50.0)
Hemoglobin: 16.2 g/dL (ref 13.2–17.1)
Lymphs Abs: 1852 cells/uL (ref 850–3900)
MCH: 28.8 pg (ref 27.0–33.0)
MCHC: 33.5 g/dL (ref 32.0–36.0)
MCV: 85.8 fL (ref 80.0–100.0)
MPV: 12.4 fL (ref 7.5–12.5)
Monocytes Relative: 8 %
Neutro Abs: 1984 cells/uL (ref 1500–7800)
Neutrophils Relative %: 45.1 %
Platelets: 223 10*3/uL (ref 140–400)
RBC: 5.63 10*6/uL (ref 4.20–5.80)
RDW: 12.1 % (ref 11.0–15.0)
Total Lymphocyte: 42.1 %
WBC: 4.4 10*3/uL (ref 3.8–10.8)

## 2019-05-03 LAB — COMPLETE METABOLIC PANEL WITH GFR
AG Ratio: 1.9 (calc) (ref 1.0–2.5)
ALT: 30 U/L (ref 9–46)
AST: 16 U/L (ref 10–35)
Albumin: 4.3 g/dL (ref 3.6–5.1)
Alkaline phosphatase (APISO): 206 U/L — ABNORMAL HIGH (ref 35–144)
BUN/Creatinine Ratio: 15 (calc) (ref 6–22)
BUN: 23 mg/dL (ref 7–25)
CO2: 25 mmol/L (ref 20–32)
Calcium: 9.8 mg/dL (ref 8.6–10.3)
Chloride: 103 mmol/L (ref 98–110)
Creat: 1.5 mg/dL — ABNORMAL HIGH (ref 0.70–1.33)
GFR, Est African American: 59 mL/min/{1.73_m2} — ABNORMAL LOW (ref >=60)
GFR, Est Non African American: 51 mL/min/{1.73_m2} — ABNORMAL LOW (ref >=60)
Globulin: 2.3 g/dL (calc) (ref 1.9–3.7)
Glucose, Bld: 360 mg/dL — ABNORMAL HIGH (ref 65–99)
Potassium: 4.2 mmol/L (ref 3.5–5.3)
Sodium: 139 mmol/L (ref 135–146)
Total Bilirubin: 1 mg/dL (ref 0.2–1.2)
Total Protein: 6.6 g/dL (ref 6.1–8.1)

## 2019-05-03 LAB — URINALYSIS, ROUTINE W REFLEX MICROSCOPIC
Bacteria, UA: NONE SEEN /HPF
Bilirubin Urine: NEGATIVE
Hgb urine dipstick: NEGATIVE
Hyaline Cast: NONE SEEN /LPF
Ketones, ur: NEGATIVE
Leukocytes,Ua: NEGATIVE
Nitrite: NEGATIVE
RBC / HPF: NONE SEEN /HPF (ref 0–2)
Specific Gravity, Urine: 1.032 (ref 1.001–1.03)
Squamous Epithelial / HPF: NONE SEEN /HPF (ref <=5)
WBC, UA: NONE SEEN /HPF (ref 0–5)
pH: 5.5 (ref 5.0–8.0)

## 2019-05-03 LAB — LIPID PANEL
Cholesterol: 167 mg/dL (ref <200)
HDL: 42 mg/dL (ref >=40)
Non-HDL Cholesterol (Calc): 125 mg/dL (calc) (ref <130)
Total CHOL/HDL Ratio: 4 (calc) (ref <5.0)
Triglycerides: 403 mg/dL — ABNORMAL HIGH (ref <150)

## 2019-05-03 LAB — HEMOGLOBIN A1C
Hgb A1c MFr Bld: 12.6 % of total Hgb — ABNORMAL HIGH (ref <5.7)
Mean Plasma Glucose: 315 (calc)
eAG (mmol/L): 17.4 (calc)

## 2019-05-03 LAB — MICROALBUMIN / CREATININE URINE RATIO
Creatinine, Urine: 58 mg/dL (ref 20–320)
Microalb Creat Ratio: 393 mcg/mg creat — ABNORMAL HIGH (ref <30)
Microalb, Ur: 22.8 mg/dL

## 2019-05-03 LAB — TEST AUTHORIZATION

## 2019-05-03 LAB — TSH: TSH: 1.15 mIU/L (ref 0.40–4.50)

## 2019-05-03 LAB — IRON,TIBC AND FERRITIN PANEL
%SAT: 40 % (calc) (ref 20–48)
Ferritin: 208 ng/mL (ref 38–380)
Iron: 89 ug/dL (ref 50–180)
TIBC: 222 mcg/dL (calc) — ABNORMAL LOW (ref 250–425)

## 2019-05-03 LAB — PSA: PSA: 4.1 ng/mL — ABNORMAL HIGH (ref <=4.0)

## 2019-05-03 LAB — TESTOSTERONE: Testosterone: 286 ng/dL (ref 250–827)

## 2019-05-03 LAB — VITAMIN D 25 HYDROXY (VIT D DEFICIENCY, FRACTURES): Vit D, 25-Hydroxy: 33 ng/mL (ref 30–100)

## 2019-05-03 LAB — MAGNESIUM: Magnesium: 2 mg/dL (ref 1.5–2.5)

## 2019-05-29 NOTE — Progress Notes (Deleted)
Diabetes Education and Follow-Up Visit  59 y.o.male presents for diabetic education. He has Diabetes Mellitus type 2:   with CKD he is on ACE Hyperlipidemia not statin, cholesterol IS at goal less than 70 ED, has viagra He is on ASA He is on lantus 30 units BID He is on amaryl 4 mg 1 pill BID MF 2000 mg a day total  Meter: Freestyle has been running well at home in the morning, 130's denies paresthesia of the feet, polydipsia and polyuria.   Last hemoglobin A1c was: Lab Results  Component Value Date   HGBA1C 12.6 (H) 05/01/2019   HGBA1C 13.6 (H) 12/19/2018   HGBA1C 9.7 (H) 09/12/2018   Pt checks his sugars {1-4:31454} x day  Lowest sugar was ***.  He has hypoglycemia awareness.  Highest sugar was ***.  Glucometer:   Exercise:  Patient does have CKD He is on ACE/ARB  Lab Results  Component Value Date   GFRAA 59 (L) 05/01/2019    Lab Results  Component Value Date   CREATININE 1.50 (H) 05/01/2019   BUN 23 05/01/2019   NA 139 05/01/2019   K 4.2 05/01/2019   CL 103 05/01/2019   CO2 25 05/01/2019    Lab Results  Component Value Date   MICROALBUR 22.8 05/01/2019     He is not on a Statin.  He is at goal of less than 70.  Lab Results  Component Value Date   CHOL 167 05/01/2019   HDL 42 05/01/2019   [place]  05/01/2019     Comment:     . LDL cholesterol not calculated. Triglyceride levels greater than 400 mg/dL invalidate calculated LDL results. . Reference range: <100 . Desirable range <100 mg/dL for primary prevention;   <70 mg/dL for patients with CHD or diabetic patients  with > or = 2 CHD risk factors. Marland Kitchen LDL-C is now calculated using the Martin-Hopkins  calculation, which is a validated novel method providing  better accuracy than the Friedewald equation in the  estimation of LDL-C.  Cresenciano Genre et al. Annamaria Helling. 2263;335(45): 2061-2068  (http://education.[employer].com/faq/FAQ164)    TRIG 403 (H) 05/01/2019   CHOLHDL 4.0 05/01/2019      Problem List has Essential hypertension; T2_NIDDM w/Stage 3 CKD (GFR 63 ml/min); Hyperlipidemia associated with type 2 diabetes mellitus ([place]); Testosterone deficiency; Vitamin D deficiency; Medication management; OSA on CPAP; History of nephrolithiasis; Morbid obesity ([place]); Hemochromatosis; Erectile dysfunction associated with type 2 diabetes mellitus ([place]); Class 3 severe obesity due to excess calories with serious comorbidity and body mass index (BMI) of 40.0 to 44.9 in adult [facility]); FH: hypertension; CKD stage 3 due to type 2 diabetes mellitus ([place]); and Type 2 diabetes mellitus with hyperlipidemia ([place]) on their problem list.  Medications  Current Outpatient Medications (Endocrine & Metabolic):  .  glimepiride (AMARYL) 4 MG tablet, Take 1 tablet 2 x/day with meals for Diabetes .  Insulin Glargine (LANTUS SOLOSTAR) 100 UNIT/ML Solostar Pen, Inject 30 t0 40 units Daily for Diabetes .  metFORMIN (GLUCOPHAGE-XR) 500 MG 24 hr tablet, TAKE 4 TABLETS (2,000 MG TOTAL) BY MOUTH DAILY. Marland Kitchen  Testosterone 20.25 MG/ACT (1.62%) GEL, Apply 4 pumps Daily  Current Outpatient Medications (Cardiovascular):  .  lisinopril (PRINIVIL,ZESTRIL) 20 MG tablet, Take 1 tablet (20 mg total) by mouth daily. .  sildenafil (VIAGRA) 100 MG tablet, TAKE 1/2 TO 1 TABLET ONCE DAILY AS NEEDED   Current Outpatient Medications (Analgesics):  .  aspirin EC 81 MG tablet, Take 81 mg by mouth daily.  Current Outpatient Medications (Other):  Marland Kitchen  Cholecalciferol (VITAMIN D-3) 125 MCG (5000 UT) TABS, Take 15,000 Units by mouth daily. (Patient taking differently: Take by mouth 2 (two) times daily. ) .  CINNAMON PO, Take 1,000 mg by mouth. Takes 2 tablets twice a day. Marland Kitchen  glucose blood (FREESTYLE LITE) test strip, CHECK BLOOD SUGAR 1 TIME A DAY-[id number]. .  glucose monitoring kit (FREESTYLE) monitoring kit, Check blood sugar 1 time a day-[id number] .  Lancets (FREESTYLE) lancets, CHECK BLOOD SUGAR 1 TIME A DAY-[id number]. Marland Kitchen   phentermine (ADIPEX-P) 37.5 MG tablet, Take 1/2 to 1 tablet every morning for dieting & weight  loss .  topiramate (TOPAMAX) 50 MG tablet, Take 1 tablet 2 x /day at Suppertime & Bedtime for Weight Loss  ROS- see HPI  Physical Exam: There were no vitals taken for this visit. There is no height or weight on file to calculate BMI. General Appearance: Well nourished, in no apparent distress. Eyes: PERRLA, EOMs, conjunctiva no swelling or erythema ENT/Mouth: Ext aud canals clear, TMs without erythema, bulging. No erythema, swelling, or exudate on post pharynx.  Tonsils not swollen or erythematous. Hearing normal.  Respiratory: Respiratory effort normal, BS equal bilaterally without rales, rhonchi, wheezing or stridor.  Cardio: RRR with no MRGs. Brisk peripheral pulses without edema.  Abdomen: Soft, + BS.  Non tender, no guarding, rebound, hernias, masses. Musculoskeletal: Full ROM, 5/5 strength, normal gait.  Skin: Warm, dry without rashes, lesions, ecchymosis.  Neuro: Cranial nerves intact. Normal muscle tone, no cerebellar symptoms. Sensation intact.    Plan and Assessment: Diabetes Education: Reviewed 'ABCs' of diabetes management (respective goals in parentheses):  A1C (<7), blood pressure (<130/80), and cholesterol (LDL <70) Eye Exam yearly and Dental Exam every 6 months. Dietary recommendations Physical Activity recommendations - Strongly advised him to start checking sugars at different times of the day - check 2 times a day, rotating checks - given sugar log and advised how to fill it and to bring it at next appt  - given foot care handout and explained the principles  - given instructions for hypoglycemia management    Future Appointments  Date Time Provider [place]  05/30/2019  2:30 PM Vicie Mutters, PA-C [id number] None  08/07/2019  3:30 PM Vicie Mutters, PA-C [facility] None  04/30/2020  3:00 PM Vicie Mutters, PA-C [facility] None

## 2019-05-30 ENCOUNTER — Ambulatory Visit: Admitting: Physician Assistant

## 2019-07-12 ENCOUNTER — Other Ambulatory Visit: Payer: Self-pay | Admitting: Internal Medicine

## 2019-07-12 MED ORDER — SILDENAFIL CITRATE 100 MG PO TABS: ORAL_TABLET | ORAL | 1 refills | Status: DC

## 2019-07-16 ENCOUNTER — Other Ambulatory Visit: Payer: Self-pay | Admitting: Internal Medicine

## 2019-07-16 DIAGNOSIS — E349 Endocrine disorder, unspecified: Secondary | ICD-10-CM

## 2019-07-16 MED ORDER — TESTOSTERONE 20.25 MG/ACT (1.62%) TD GEL: TRANSDERMAL | 1 refills | Status: DC

## 2019-08-05 NOTE — Progress Notes (Signed)
FOLLOW UP  Assessment and Plan:   Hypertension Well controlled with current medications  Monitor blood pressure at home; patient to call if consistently greater than 130/80 Continue DASH diet.   Reminder to go to the ER if any CP, SOB, nausea, dizziness, severe HA, changes vision/speech, left arm numbness and tingling and jaw pain.  Cholesterol Currently at LDL goal; strong patient preference to avoid starting statin Continue low cholesterol diet and exercise.  Check lipid panel.   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  Continue diet and exercise.  Perform daily foot/skin check, notify office of any concerning changes.  Check A1C 1 month follow up  Morbid obesity Long discussion about weight loss, diet, and exercise, risks of chronic obesity Recommended diet heavy in fruits and veggies and low in animal meats, cheeses, and dairy products, appropriate calorie intake Discussed ideal weight for height, he does not want to set a weight goal Patient will work on continuing with portion control, exercise Patient will start back on phentermine with benefit and no SE, taking drug breaks; continue close follow up. 1 month follow up  Vitamin D Def Below goal at last visit; he has increased dose Check Vit D level  Hypogonadism-  continue replacement therapy, check testosterone levels as needed.  Weight loss encouraged   Continue diet and meds as discussed. Further disposition pending results of labs. Discussed med's effects and SE's.    Over 30 minutes of exam, counseling, chart review, and critical decision making was performed.   Future Appointments  Date Time Provider [place]  04/30/2020  3:00 PM Vicie Mutters, PA-C [facility] None    ----------------------------------------------------------------------------------------------------------------------  HPI 59 y.o. male  presents for 3 month follow up on hypertension, cholesterol, diabetes, morbid obeisty and vitamin D deficiency.   He is followed by Dr. Marin Olp for hemochromatosis and gets phlebotomies PRN.   BMI is Body mass index is 42.54 kg/m., he has been working on diet and exercise, he states with the nicer weather he will be outside more and moving more. He has phentermine and topamax at home but he is not taking them at this time, will start taking them.  Wt Readings from Last 3 Encounters:  08/07/19 (!) 318 lb (144.2 kg)  05/01/19 (!) 313 lb 6.4 oz (142.2 kg)  12/19/18 (!) 302 lb 12.8 oz (137.3 kg)   His blood pressure has been controlled at home, today their BP is BP: 122/84  He does not workout, but bought a road bike and plans to restart this soon. He denies chest pain, shortness of breath, dizziness.   He is not on cholesterol medication per strong patient preference. His LDL cholesterol is at goal, trigs remain elevated. The cholesterol last visit was:   Lab Results  Component Value Date   CHOL 167 05/01/2019   HDL 42 05/01/2019   [place]  05/01/2019     Comment:     . LDL cholesterol not calculated. Triglyceride levels greater than 400 mg/dL invalidate calculated LDL results. . Reference range: <100 . Desirable range <100 mg/dL for primary prevention;   <70 mg/dL for patients  with CHD or diabetic patients  with > or = 2 CHD risk factors. Marland Kitchen LDL-C is now calculated using the Martin-Hopkins  calculation, which is a validated novel method providing  better accuracy than the Friedewald equation in the  estimation of LDL-C.   Cresenciano Genre et al. Annamaria Helling. 3235;573(22): 2061-2068  (http://education.[facility].com/faq/FAQ164)    TRIG 403 (H) 05/01/2019   CHOLHDL 4.0 05/01/2019    He has been working on diet and exercise for T2 diabetes  With hyperlipidemia not on medication On ACE on metformin 2000 mg amaryl 4 mg BID cinnamon 1000 mg BID Glargine 40 units once in the AM and denies foot ulcerations, increased appetite, nausea, paresthesia of the feet, polydipsia, polyuria, visual disturbances, vomiting and weight loss.   He does check  fasting sugars a few days a week, 120-130. Not interested in a continuous monitor.    Last A1C in the office was:  Lab Results  Component Value Date   HGBA1C 12.6 (H) 05/01/2019   Patient is on Vitamin D supplement.   Lab Results  Component Value Date   VD25OH 33 05/01/2019     He has a history of testosterone deficiency and is on testosterone replacement (topical). 4 pumps a day. He states that the testosterone helps with his energy, libido, muscle mass. Lab Results  Component Value Date   TESTOSTERONE 286 05/01/2019      Current Medications:  Current Outpatient Medications on File Prior to Visit  Medication Sig  . aspirin EC 81 MG tablet Take 81 mg by mouth daily.  . Cholecalciferol (VITAMIN D-3) 125 MCG (5000 UT) TABS Take 15,000 Units by mouth daily. (Patient taking differently: Take by mouth 2 (two) times daily. )  . CINNAMON PO Take 1,000 mg by mouth. Takes 2 tablets twice a day.  Marland Kitchen glimepiride (AMARYL) 4 MG tablet Take 1 tablet 2 x/day with meals for Diabetes  . glucose blood (FREESTYLE LITE) test strip CHECK BLOOD SUGAR 1 TIME A DAY-dx-E11.29.  . glucose monitoring kit (FREESTYLE) monitoring kit Check blood sugar 1 time a day-[id number]  . Insulin Glargine (LANTUS SOLOSTAR) 100 UNIT/ML Solostar Pen Inject 30 t0 40 units Daily for Diabetes  . Lancets (FREESTYLE) lancets CHECK BLOOD SUGAR 1 TIME A DAY-DX-E11.29.  Marland Kitchen lisinopril (PRINIVIL,ZESTRIL) 20 MG tablet  Take 1 tablet (20 mg total) by mouth daily.  . metFORMIN (GLUCOPHAGE-XR) 500 MG 24 hr tablet TAKE 4 TABLETS (2,000 MG TOTAL) BY MOUTH DAILY.  Marland Kitchen phentermine (ADIPEX-P) 37.5 MG tablet Take 1/2 to 1 tablet every morning for dieting & weight  loss  . sildenafil (VIAGRA) 100 MG tablet Take 1 /2 to 1 tablet Daily if needed for XXXX  . Testosterone 20.25 MG/ACT (1.62%) GEL Apply 4 pumps Daily  . topiramate (TOPAMAX) 50 MG tablet Take 1 tablet 2 x /day at Suppertime & Bedtime for Weight Loss   No current facility-administered medications on file prior to visit.     Allergies: No Known Allergies   Medical History:  Past Medical History:  Diagnosis Date  . Erectile dysfunction 09/11/2014  . History of kidney stones   . Hypogonadism male   . Left ureteral stone   . OSA on CPAP   . Right bundle branch block   . Sleep apnea   . Type 2 diabetes mellitus (HCC)    Family history- Reviewed and unchanged Social history- Reviewed and unchanged   Review of Systems:  Review of Systems  Constitutional: Negative for malaise/fatigue and weight loss.  HENT: Negative for  hearing loss and tinnitus.   Eyes: Negative for blurred vision and double vision.  Respiratory: Negative for cough, shortness of breath and wheezing.   Cardiovascular: Negative for chest pain, palpitations, orthopnea, claudication and leg swelling.  Gastrointestinal: Negative for abdominal pain, blood in stool, constipation, diarrhea, heartburn, melena, nausea and vomiting.  Genitourinary: Negative.   Musculoskeletal: Negative for joint pain and myalgias.  Skin: Negative for rash.  Neurological: Negative for dizziness, tingling, sensory change, weakness and headaches.  Endo/Heme/Allergies: Negative for polydipsia.  Psychiatric/Behavioral: Negative.   All other systems reviewed and are negative.     Physical Exam: BP 122/84   Pulse 90   Temp 97.6 F (36.4 C)   Wt (!) 318 lb (144.2 kg)   SpO2 97%   BMI 42.54 kg/m  Wt  Readings from Last 3 Encounters:  08/07/19 (!) 318 lb (144.2 kg)  05/01/19 (!) 313 lb 6.4 oz (142.2 kg)  12/19/18 (!) 302 lb 12.8 oz (137.3 kg)   General Appearance: Well nourished, morbidly obese AA male, in no apparent distress. Eyes: PERRLA, EOMs, conjunctiva no swelling or erythema Sinuses: No Frontal/maxillary tenderness ENT/Mouth: Ext aud canals clear, TMs without erythema, bulging. No erythema, swelling, or exudate on post pharynx.  Tonsils not swollen or erythematous. Hearing normal.  Neck: Supple, thyroid normal.  Respiratory: Respiratory effort normal, BS equal bilaterally without rales, rhonchi, wheezing or stridor.  Cardio: Regularly irregular rhythmn with no MRGs. Brisk peripheral pulses without edema.  Abdomen: Soft, + BS.  Non tender, no guarding, rebound, hernias, masses. Lymphatics: Non tender without lymphadenopathy.  Musculoskeletal: Full ROM, 5/5 strength, Normal gait Skin: Warm, dry without rashes, lesions, ecchymosis.  Neuro: Cranial nerves intact. No cerebellar symptoms.  Psych: Awake and oriented X 3, normal affect, Insight and Judgment appropriate.    Vicie Mutters, PA-C 4:33 PM [facility]

## 2019-08-07 ENCOUNTER — Encounter: Payer: Self-pay | Admitting: Physician Assistant

## 2019-08-07 ENCOUNTER — Other Ambulatory Visit: Payer: Self-pay

## 2019-08-07 ENCOUNTER — Ambulatory Visit (INDEPENDENT_AMBULATORY_CARE_PROVIDER_SITE_OTHER): Admitting: Physician Assistant

## 2019-08-07 VITALS — BP 122/84 | HR 90 | Temp 97.6°F | Wt 318.0 lb

## 2019-08-07 DIAGNOSIS — E1122 Type 2 diabetes mellitus with diabetic chronic kidney disease: Secondary | ICD-10-CM

## 2019-08-07 DIAGNOSIS — E66813 Obesity, class 3: Secondary | ICD-10-CM

## 2019-08-07 DIAGNOSIS — Z79899 Other long term (current) drug therapy: Secondary | ICD-10-CM

## 2019-08-07 DIAGNOSIS — E1121 Type 2 diabetes mellitus with diabetic nephropathy: Secondary | ICD-10-CM | POA: Diagnosis not present

## 2019-08-07 DIAGNOSIS — N183 Chronic kidney disease, stage 3 unspecified: Secondary | ICD-10-CM

## 2019-08-07 DIAGNOSIS — E785 Hyperlipidemia, unspecified: Secondary | ICD-10-CM

## 2019-08-07 DIAGNOSIS — E559 Vitamin D deficiency, unspecified: Secondary | ICD-10-CM

## 2019-08-07 DIAGNOSIS — I10 Essential (primary) hypertension: Secondary | ICD-10-CM

## 2019-08-07 DIAGNOSIS — E1169 Type 2 diabetes mellitus with other specified complication: Secondary | ICD-10-CM

## 2019-08-07 DIAGNOSIS — E349 Endocrine disorder, unspecified: Secondary | ICD-10-CM

## 2019-08-07 DIAGNOSIS — Z6841 Body Mass Index (BMI) 40.0 and over, adult: Secondary | ICD-10-CM

## 2019-08-07 MED ORDER — OZEMPIC (0.25 OR 0.5 MG/DOSE) 2 MG/1.5ML ~~LOC~~ SOPN: 0.5000 mg | PEN_INJECTOR | SUBCUTANEOUS | 3 refills | Status: DC

## 2019-08-07 NOTE — Patient Instructions (Addendum)
Please check your sugars more.  Can look up dexcom or freestyle and let me know if you are ever interested.   Use a dropper or use a cap to put peroxide, olive oil,mineral oil or canola oil in the effected ear- 2-3 times a week. Let it soak for 20-30 min then you can take a shower or use a baby bulb with warm water to wash out the ear wax.  Can buy debrox wax removal kit over the counter.  Do not use Qtips  Start ozempic injection as shown once a week. The starting dose is 0.25 mg on the pen for the first 4 weeks.  You may inject in the stomach, thigh or arm. You may experience nausea in the first few days which usually goes away.   You will feel fullness of the stomach with starting the medication and should try to keep the portions at meals small.  After 4 weeks increase the dose to 0.'5mg'$  daily if no nausea present.    If any questions or concerns are present call the office  After another month we can try to increase to 1 mg but people often experience nausea with this.   Please be careful with glimepiride (Amaryl).   This medication forces your blood sugar down no matter what it is starting at.  ONLY TAKE THIS MEDICATION WITH FOOD Do not take this medication if your sugar is below 150  If at any time you start to have low blood sugars in the morning or during the day please stop this medication.  Please never take this medication if you are sick or can not eat.  A low blood sugar is much more dangerous than a high blood sugar. Your brain needs two things, sugar and oxygen.   RANGE OF A1C   Your A1C is a measure of your sugar over the past 3 months and is not affected by what you have eaten over the past few days. Diabetes increases your chances of stroke and heart attack over 300 % and is the leading cause of blindness and kidney failure in the [place]. Please make sure you decrease bad carbs like white bread, white rice, potatoes, corn, soft drinks, pasta, cereals, refined sugars,  sweet tea, dried fruits, and fruit juice. Good carbs are okay to eat in moderation like sweet potatoes, brown rice, whole grain pasta/bread, most fruit (except dried fruit) and you can eat as many veggies as you want.   Greater than 6.5 is considered diabetic. Between 6.4 and 5.7 is prediabetic If your A1C is less than 5.7 you are NOT diabetic.  Targets for Glucose Readings: Time of Check Target for patients WITHOUT Diabetes Target for DIABETICS  Before Meals Less than 100  less than 150  Two hours after meals Less than 200  Less than 250     Who Qualifies for Obesity Medications? Although everyone is hopeful for a fast and easy way to lose weight, nothing has been shown to replace a prudent, calorie-controlled diet along with behavior modification as a cornerstone for all obesity treatments.   The next tool that can be used to achieve weight-loss and health improvement is medication.   Pharmacotherapy may be offered to Individuals affected by obesity who have failed to achieve weight-loss through diet and exercise alone.  We have decided to try you on a weight loss medication here is some general information about this medication.    TOPIRAMATE Sometimes we will prescribe topiramate AND phentermine together  to make Qsymia- separating the medications makes it cheaper.  Or topiramate can be used by itself for weight loss at night.   How to start it start on 1/2 pill for 3-5 nights, can increase to a whole pill for 1-2 weeks.  This medication is good for weight loss, headaches, pain This medication can cause numbness, tingling and can cause brain fog- stop if you get these Check with your eye doctor if you have a history of glaucoma and stop if you severe vision changes or blurry vision.  There is a long acting medication that we can switch to if you have numbness,tingling or brain fog.   Topiramate has been associated with an increased risk of birth defects- and should NOT be taken  if pregnant or becoming pregnant.   Follow-up Visits: Frequent visits (every 3 to 4 weeks) are encouraged until initial weight-loss goals (5 to 10 percent of body weight) are achieved.   At that point, less frequent visits are typically scheduled as needed for individual patients. However, since obesity is considered a chronic life-long problem for many individuals, periodic continual follow up is recommended.  Research has shown that weight-loss as low as 5 percent of initial body weight can lead to favorable improvements in blood pressure, cholesterol, glucose levels and insulin sensitivity. The risk of developing heart disease is reduced the most in patients who have impaired glucose tolerance, type 2 diabetes or high blood pressure.   Phentermine  While taking the medication we may ask that you come into the office once a month. The first month we will get an EKG on you.   PLEASE BRING A FOOD LONG TO THE FIRST VISIT.   It is helpful if you bring in a food diary or use an app on your phone such as myfitnesspal to record your calorie intake, especially in the beginning.  After that first initial visit, we will want to see you once a month for accountability.  In addition we can help answer your questions about diet, exercise, and help you every step of the way with your weight loss journey.  HOW TO START THE MEDICATION You can start out on 1/2 a pill in the morning  FOR THE FIRST MONTH.  WE WILL THEN INCREASE TO 1 PILL AFTER THAT 1ST VISIT.   COST OF THE MEDICATION This medication is cheapest CASH pay at [place] is 14-17 dollars and you do NOT need a membership to get meds from there.   SIDE EFFECTS It causes dry mouth and constipation in almost every patient, so try to get 80-100 oz of water a day and increase fiber such as veggies. You can add on a stool softener if you would like.   It can give you energy however it can also cause some people to be shaky,  anxious or have palpitations. Stop this medication if that happens and contact the office.   If this medication does not work for you there are several medications that we can try to help rewire your brain in addition to making healthier habits.   What is this medicine? PHENTERMINE (FEN ter meen) decreases your appetite. This medicine is intended to be used in addition to a healthy reduced calorie diet and exercise. The best results are achieved this way. This medicine is only indicated for short-term use. Eventually your weight loss may level out and the medication will no longer be needed.   How should I use this medicine?  Take this medicine by mouth. Follow the directions on the prescription label. The tablets should stay in the bottle until immediately before you take your dose. Take your doses at regular intervals. Do not take your medicine more often than directed.  Overdosage: If you think you have taken too much of this medicine contact a poison control center or emergency room at once. NOTE: This medicine is only for you. Do not share this medicine with others.  What if I miss a dose? If you miss a dose, take it as soon as you can. If it is almost time for your next dose, take only that dose. Do not take double or extra doses. Do not increase or in any way change your dose without consulting your doctor.  What should I watch for while using this medicine? Notify your physician immediately if you become short of breath while doing your normal activities. Do not take this medicine within 6 hours of bedtime. It can keep you from getting to sleep. Avoid drinks that contain caffeine and try to stick to a regular bedtime every night. Do not stand or sit up quickly, especially if you are an older patient. This reduces the risk of dizzy or fainting spells. Avoid alcoholic drinks.  What side effects may I notice from receiving this medicine? Side effects that you should report to your doctor or  health care professional as soon as possible: -chest pain, palpitations -depression or severe changes in mood -increased blood pressure -irritability -nervousness or restlessness -severe dizziness -shortness of breath -problems urinating -unusual swelling of the legs -vomiting  Side effects that usually do not require medical attention (report to your doctor or health care professional if they continue or are bothersome): -blurred vision or other eye problems -changes in sexual ability or desire -constipation or diarrhea -difficulty sleeping -dry mouth or unpleasant taste -headache -nausea This list may not describe all possible side effects. Call your doctor for medical advice about side effects. You may report side effects to FDA at 1-800-FDA-1088.

## 2019-08-08 LAB — LIPID PANEL
Cholesterol: 178 mg/dL (ref <200)
HDL: 45 mg/dL (ref >=40)
LDL Cholesterol (Calc): 90 mg/dL (calc)
Non-HDL Cholesterol (Calc): 133 mg/dL (calc) — ABNORMAL HIGH (ref <130)
Total CHOL/HDL Ratio: 4 (calc) (ref <5.0)
Triglycerides: 324 mg/dL — ABNORMAL HIGH (ref <150)

## 2019-08-08 LAB — CBC WITH DIFFERENTIAL/PLATELET
Absolute Monocytes: 334 cells/uL (ref 200–950)
Basophils Absolute: 53 cells/uL (ref 0–200)
Basophils Relative: 1 %
Eosinophils Absolute: 180 cells/uL (ref 15–500)
Eosinophils Relative: 3.4 %
HCT: 49.1 % (ref 38.5–50.0)
Hemoglobin: 16.3 g/dL (ref 13.2–17.1)
Lymphs Abs: 2136 cells/uL (ref 850–3900)
MCH: 28.4 pg (ref 27.0–33.0)
MCHC: 33.2 g/dL (ref 32.0–36.0)
MCV: 85.5 fL (ref 80.0–100.0)
MPV: 11.7 fL (ref 7.5–12.5)
Monocytes Relative: 6.3 %
Neutro Abs: 2597 cells/uL (ref 1500–7800)
Neutrophils Relative %: 49 %
Platelets: 219 10*3/uL (ref 140–400)
RBC: 5.74 10*6/uL (ref 4.20–5.80)
RDW: 12.2 % (ref 11.0–15.0)
Total Lymphocyte: 40.3 %
WBC: 5.3 10*3/uL (ref 3.8–10.8)

## 2019-08-08 LAB — VITAMIN D 25 HYDROXY (VIT D DEFICIENCY, FRACTURES): Vit D, 25-Hydroxy: 35 ng/mL (ref 30–100)

## 2019-08-08 LAB — COMPLETE METABOLIC PANEL WITH GFR
AG Ratio: 1.5 (calc) (ref 1.0–2.5)
ALT: 29 U/L (ref 9–46)
AST: 12 U/L (ref 10–35)
Albumin: 4 g/dL (ref 3.6–5.1)
Alkaline phosphatase (APISO): 184 U/L — ABNORMAL HIGH (ref 35–144)
BUN/Creatinine Ratio: 12 (calc) (ref 6–22)
BUN: 20 mg/dL (ref 7–25)
CO2: 26 mmol/L (ref 20–32)
Calcium: 10.2 mg/dL (ref 8.6–10.3)
Chloride: 105 mmol/L (ref 98–110)
Creat: 1.66 mg/dL — ABNORMAL HIGH (ref 0.70–1.33)
GFR, Est African American: 53 mL/min/{1.73_m2} — ABNORMAL LOW (ref >=60)
GFR, Est Non African American: 45 mL/min/{1.73_m2} — ABNORMAL LOW (ref >=60)
Globulin: 2.7 g/dL (calc) (ref 1.9–3.7)
Glucose, Bld: 372 mg/dL — ABNORMAL HIGH (ref 65–99)
Potassium: 4.5 mmol/L (ref 3.5–5.3)
Sodium: 139 mmol/L (ref 135–146)
Total Bilirubin: 0.9 mg/dL (ref 0.2–1.2)
Total Protein: 6.7 g/dL (ref 6.1–8.1)

## 2019-08-08 LAB — TSH: TSH: 1.16 mIU/L (ref 0.40–4.50)

## 2019-08-08 LAB — MAGNESIUM: Magnesium: 1.9 mg/dL (ref 1.5–2.5)

## 2019-08-08 LAB — HEMOGLOBIN A1C: Hgb A1c MFr Bld: >14 % of total Hgb — ABNORMAL HIGH (ref <5.7)

## 2019-08-09 ENCOUNTER — Other Ambulatory Visit: Payer: Self-pay | Admitting: Internal Medicine

## 2019-08-09 DIAGNOSIS — E1122 Type 2 diabetes mellitus with diabetic chronic kidney disease: Secondary | ICD-10-CM

## 2019-08-22 ENCOUNTER — Ambulatory Visit: Payer: BLUE CROSS/BLUE SHIELD | Attending: Internal Medicine

## 2019-08-22 DIAGNOSIS — Z23 Encounter for immunization: Secondary | ICD-10-CM

## 2019-08-22 NOTE — Progress Notes (Signed)
   Covid-19 Vaccination Clinic  Name:  Carlos [name]    MRN: [id number] DOB: 1961-04-14  08/22/2019  [name] was observed post Covid-19 immunization for 15 minutes without incident. He was provided with Vaccine Information Sheet and instruction to access the V-Safe system.   [name] was instructed to call 911 with any severe reactions post vaccine: Marland Kitchen Difficulty breathing  . Swelling of face and throat  . A fast heartbeat  . A bad rash all over body  . Dizziness and weakness   Immunizations Administered    Name Date Dose VIS Date Route   Pfizer COVID-19 Vaccine 08/22/2019  4:36 PM 0.3 mL 06/26/2018 Intramuscular   Manufacturer: [place]   Lot: [id number]   [place]: [id number]

## 2019-09-16 ENCOUNTER — Ambulatory Visit: Payer: BLUE CROSS/BLUE SHIELD | Attending: Internal Medicine

## 2019-09-16 DIAGNOSIS — Z23 Encounter for immunization: Secondary | ICD-10-CM

## 2019-09-16 NOTE — Progress Notes (Signed)
   Covid-19 Vaccination Clinic  Name:  BARTO MURTAGH    MRN: [id number] DOB: 1960/05/12  09/16/2019  Mr. Champigny was observed post Covid-19 immunization for 15 minutes without incident. He was provided with Vaccine Information Sheet and instruction to access the V-Safe system.   Mr. Obarr was instructed to call 911 with any severe reactions post vaccine: Marland Kitchen Difficulty breathing  . Swelling of face and throat  . A fast heartbeat  . A bad rash all over body  . Dizziness and weakness   Immunizations Administered    Name Date Dose VIS Date Route   Pfizer COVID-19 Vaccine 09/16/2019  2:04 PM 0.3 mL 06/26/2018 Intramuscular   Manufacturer: [place]   Lot: [id number]   [place]: [id number]

## 2019-10-03 ENCOUNTER — Other Ambulatory Visit: Payer: Self-pay | Admitting: Physician Assistant

## 2019-10-03 MED ORDER — OZEMPIC (0.25 OR 0.5 MG/DOSE) 2 MG/1.5ML ~~LOC~~ SOPN: 0.5000 mg | PEN_INJECTOR | SUBCUTANEOUS | 0 refills | Status: DC

## 2019-10-03 NOTE — Progress Notes (Signed)
Future Appointments  Date Time Provider [place]  04/30/2020  3:00 PM Vicie Mutters, PA-C [facility] None   Was suppose to have 1 month OV from April visit.  Needs 3 month OV for July  [facility], can [facility] up sample AT OV.  May need to do trulicity or Bcise per insurance.

## 2019-11-12 ENCOUNTER — Other Ambulatory Visit: Payer: Self-pay | Admitting: Internal Medicine

## 2019-12-13 ENCOUNTER — Ambulatory Visit (INDEPENDENT_AMBULATORY_CARE_PROVIDER_SITE_OTHER): Admitting: Physician Assistant

## 2019-12-13 ENCOUNTER — Other Ambulatory Visit: Payer: Self-pay

## 2019-12-13 ENCOUNTER — Encounter: Payer: Self-pay | Admitting: Physician Assistant

## 2019-12-13 VITALS — BP 142/90 | HR 86 | Temp 98.6°F | Wt 318.0 lb

## 2019-12-13 DIAGNOSIS — E1122 Type 2 diabetes mellitus with diabetic chronic kidney disease: Secondary | ICD-10-CM | POA: Diagnosis not present

## 2019-12-13 DIAGNOSIS — Z79899 Other long term (current) drug therapy: Secondary | ICD-10-CM

## 2019-12-13 DIAGNOSIS — E559 Vitamin D deficiency, unspecified: Secondary | ICD-10-CM | POA: Diagnosis not present

## 2019-12-13 DIAGNOSIS — E1121 Type 2 diabetes mellitus with diabetic nephropathy: Secondary | ICD-10-CM | POA: Diagnosis not present

## 2019-12-13 DIAGNOSIS — E1169 Type 2 diabetes mellitus with other specified complication: Secondary | ICD-10-CM | POA: Diagnosis not present

## 2019-12-13 DIAGNOSIS — E785 Hyperlipidemia, unspecified: Secondary | ICD-10-CM

## 2019-12-13 DIAGNOSIS — E349 Endocrine disorder, unspecified: Secondary | ICD-10-CM | POA: Diagnosis not present

## 2019-12-13 DIAGNOSIS — Z6841 Body Mass Index (BMI) 40.0 and over, adult: Secondary | ICD-10-CM

## 2019-12-13 DIAGNOSIS — N183 Chronic kidney disease, stage 3 unspecified: Secondary | ICD-10-CM

## 2019-12-13 DIAGNOSIS — I10 Essential (primary) hypertension: Secondary | ICD-10-CM

## 2019-12-13 MED ORDER — OZEMPIC (1 MG/DOSE) 2 MG/1.5ML ~~LOC~~ SOPN: 1.0000 mg | PEN_INJECTOR | SUBCUTANEOUS | 1 refills | Status: DC

## 2019-12-13 NOTE — Progress Notes (Signed)
FOLLOW UP  Assessment and Plan:   Hypertension Well controlled with current medications  Monitor blood pressure at home; patient to call if consistently greater than 130/80 Continue DASH diet.   Reminder to go to the ER if any CP, SOB, nausea, dizziness, severe HA, changes vision/speech, left arm numbness and tingling and jaw pain.  Cholesterol Currently at LDL goal; strong patient preference to avoid starting statin- discussed risk of MI, stroke and death Continue low cholesterol diet and exercise.  Check lipid panel.   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  Continue diet and exercise.  Perform daily foot/skin check, notify office of any concerning changes.  Check A1C  Morbid obesity Long discussion about weight loss, diet, and exercise, risks of chronic obesity Recommended diet heavy in fruits and veggies and low in animal meats, cheeses, and dairy products, appropriate calorie intake Discussed ideal weight for height, he does not want to set a weight goal Patient will work on continuing with portion control, exercise Patient will start back on phentermine with benefit and no SE, taking drug breaks; continue close follow up.  Vitamin D Def Below goal at last visit; he has increased dose Check Vit D level  Hypogonadism-  continue replacement therapy, check testosterone levels as needed.  Weight loss encouraged   Continue diet and meds as discussed. Further disposition pending results of labs. Discussed med's effects and SE's.   Over 30 minutes of exam,  counseling, chart review, and critical decision making was performed.   Future Appointments  Date Time Provider [place]  04/30/2020  3:00 PM Vicie Mutters, PA-C [id number] None    ----------------------------------------------------------------------------------------------------------------------  HPI 59 y.o. male  presents for 3 month follow up on hypertension, cholesterol, diabetes, morbid obeisty and vitamin D deficiency.   He is followed by Dr. Marin Olp for hemochromatosis and gets phlebotomies PRN.   BMI is Body mass index is 42.54 kg/m., he has been working on diet and exercise.Marland Kitchen He has phentermine and topamax at home but he is not taking them at this time, will start taking them.  Wt Readings from Last 3 Encounters:  12/13/19 (!) 318 lb (144.2 kg)  08/07/19 (!) 318 lb (144.2 kg)  05/01/19 (!) 313 lb 6.4 oz (142.2 kg)   His blood pressure has been controlled at home, today their BP is BP: (!) 142/90  He does not workout, but bought a road bike and plans to restart this soon. He denies chest pain, shortness of breath, dizziness.   He is not on cholesterol medication per strong patient preference. His LDL cholesterol is at goal, trigs remain elevated. The cholesterol last visit was:   Lab Results  Component Value Date   CHOL 178 08/07/2019   HDL 45 08/07/2019   LDLCALC 90 08/07/2019   TRIG 324 (H) 08/07/2019   CHOLHDL 4.0 08/07/2019    He has been working on diet and exercise for T2 diabetes  With hyperlipidemia not on medication With CKD stage 2 On ACE on metformin 2000 mg amaryl 4 mg BID cinnamon 1000 mg BID with food Patient hand uncontrolled DM last visit and was started on ozempic 0.54m  as well as his glargine. Was suppose to have a 1 month follow up on sugars but never made the appointment.   Glargine 40 units once in the AM and denies foot ulcerations, increased appetite, nausea, paresthesia of the feet, polydipsia, polyuria, visual disturbances,  vomiting and weight loss.   He does check  fasting sugars a few days a week, 120-130. Not interested in a continuous monitor.    Last A1C in the office was:  Lab Results  Component Value Date   HGBA1C >14.0 (H) 08/07/2019   Lab Results  Component Value Date   GFRAA 53 (L) 08/07/2019   Patient is on Vitamin D supplement.   Lab Results  Component Value Date   VD25OH 35 08/07/2019     He has a history of testosterone deficiency and is on testosterone replacement (topical). 4 pumps a day. He states that the testosterone helps with his energy, libido, muscle mass. Lab Results  Component Value Date   TESTOSTERONE 286 05/01/2019      Current Medications:   Current Outpatient Medications (Endocrine & Metabolic):  .  glimepiride (AMARYL) 4 MG tablet, Take 1 tablet 2 x /day with Meals for Diabetes .  Insulin Glargine (LANTUS SOLOSTAR) 100 UNIT/ML Solostar Pen, Inject 30 t0 40 units Daily for Diabetes .  metFORMIN (GLUCOPHAGE-XR) 500 MG 24 hr tablet, Take 2 tablets 2 x /day with Meals for Diabetes .  Semaglutide,0.25 or 0.5MG/DOS, (OZEMPIC, 0.25 OR 0.5 MG/DOSE,) 2 MG/1.5ML SOPN, Inject 0.5 mg into the skin once a week. .  Testosterone 20.25 MG/ACT (1.62%) GEL, Apply 4 pumps Daily  Current Outpatient Medications (Cardiovascular):  .  lisinopril (PRINIVIL,ZESTRIL) 20 MG tablet, Take 1 tablet (20 mg total) by mouth daily. .  sildenafil (VIAGRA) 100 MG tablet, Take 1/2 to 1  tablet  Daily if needed for XXXX   Current Outpatient Medications (Analgesics):  .  aspirin EC 81 MG tablet, Take 81 mg by mouth daily.   Current Outpatient Medications (Other):  Marland Kitchen  Cholecalciferol (VITAMIN D-3) 125 MCG (5000 UT) TABS, Take 15,000 Units by mouth daily. (Patient taking differently: Take by mouth 2 (two) times daily. ) .  CINNAMON PO, Take 1,000 mg by mouth. Takes 2 tablets twice a day. Marland Kitchen  glucose blood (FREESTYLE LITE) test strip, CHECK BLOOD SUGAR 1 TIME A DAY-[id number]. .  glucose monitoring kit  (FREESTYLE) monitoring kit, Check blood sugar 1 time a day-[id number] .  Lancets (FREESTYLE) lancets, CHECK BLOOD SUGAR 1 TIME A DAY-[id number]. Marland Kitchen  phentermine (ADIPEX-P) 37.5 MG tablet, Take 1/2 to 1 tablet every morning for dieting & weight  loss .  topiramate (TOPAMAX) 50 MG tablet, Take 1 tablet 2 x /day at Suppertime & Bedtime for Weight Loss   Allergies: No Known Allergies   Medical History:  Past Medical History:  Diagnosis Date  . Erectile dysfunction 09/11/2014  . History of kidney stones   . Hypogonadism male   . Left ureteral stone   . OSA on CPAP   . Right bundle branch block   . Sleep apnea   . Type 2 diabetes mellitus (HCC)    Family history- Reviewed and unchanged Social history- Reviewed and unchanged   Review of Systems:  Review of Systems  Constitutional: Negative for malaise/fatigue and weight loss.  HENT: Negative for hearing loss and tinnitus.   Eyes: Negative for blurred vision and double vision.  Respiratory: Negative for cough, shortness of breath and wheezing.   Cardiovascular: Negative for chest pain, palpitations, orthopnea, claudication  and leg swelling.  Gastrointestinal: Negative for abdominal pain, blood in stool, constipation, diarrhea, heartburn, melena, nausea and vomiting.  Genitourinary: Negative.   Musculoskeletal: Negative for joint pain and myalgias.  Skin: Negative for rash.  Neurological: Negative for dizziness, tingling, sensory change, weakness and headaches.  Endo/Heme/Allergies: Negative for polydipsia.  Psychiatric/Behavioral: Negative.   All other systems reviewed and are negative.     Physical Exam: BP (!) 142/90   Pulse 86   Temp 98.6 F (37 C)   Wt (!) 318 lb (144.2 kg)   SpO2 99%   BMI 42.54 kg/m  Wt Readings from Last 3 Encounters:  12/13/19 (!) 318 lb (144.2 kg)  08/07/19 (!) 318 lb (144.2 kg)  05/01/19 (!) 313 lb 6.4 oz (142.2 kg)   General Appearance: Well nourished, morbidly obese AA male, in no apparent  distress. Eyes: PERRLA, EOMs, conjunctiva no swelling or erythema Sinuses: No Frontal/maxillary tenderness ENT/Mouth: Ext aud canals clear, TMs without erythema, bulging. No erythema, swelling, or exudate on post pharynx.  Tonsils not swollen or erythematous. Hearing normal.  Neck: Supple, thyroid normal.  Respiratory: Respiratory effort normal, BS equal bilaterally without rales, rhonchi, wheezing or stridor.  Cardio: Regularly irregular rhythmn with no MRGs. Brisk peripheral pulses without edema.  Abdomen: Soft, + BS.  Non tender, no guarding, rebound, hernias, masses. Lymphatics: Non tender without lymphadenopathy.  Musculoskeletal: Full ROM, 5/5 strength, Normal gait Skin: Warm, dry without rashes, lesions, ecchymosis.  Neuro: Cranial nerves intact. No cerebellar symptoms.  Psych: Awake and oriented X 3, normal affect, Insight and Judgment appropriate.    Vicie Mutters, PA-C 10:12 AM [facility]

## 2019-12-13 NOTE — Patient Instructions (Signed)
Use a dropper or use a cap to put peroxide, olive oil,mineral oil or canola oil in the effected ear- 2-3 times a week. Let it soak for 20-30 min then you can take a shower or use a baby bulb with warm water to wash out the ear wax.  Can buy debrox wax removal kit over the counter.  Do not use Qtips  HYPERTENSION INFORMATION  Monitor your blood pressure at home, please keep a record and bring that in with you to your next office visit.   Go to the ER if any CP, SOB, nausea, dizziness, severe HA, changes vision/speech  Testing/Procedures: HOW TO TAKE YOUR BLOOD PRESSURE:  Rest 5 minutes before taking your blood pressure.  Don't smoke or drink caffeinated beverages for at least 30 minutes before.  Take your blood pressure before (not after) you eat.  Sit comfortably with your back supported and both feet on the floor (don't cross your legs).  Elevate your arm to heart level on a table or a desk.  Use the proper sized cuff. It should fit smoothly and snugly around your bare upper arm. There should be enough room to slip a fingertip under the cuff. The bottom edge of the cuff should be 1 inch above the crease of the elbow.  Due to a recent study, SPRINT, we have changed our goal for the systolic or top blood pressure number. Ideally we want your top number at 120.  In the [facility] Trial, 5000 people were randomized to a goal BP of 120 and 5000 people were randomized to a goal BP of less than 140. The patients with the goal BP at 120 had LESS DEMENTIA, LESS HEART ATTACKS, AND LESS STROKES, AS WELL AS OVERALL DECREASED MORTALITY OR DEATH RATE.   There was another study that showed taking your blood pressure medications at night decrease cardiovascular events.  However if you are on a fluid pill, please take this in the morning.   If you are willing, our goal BP is the top number of 120.  Your most recent BP: BP: (!) 142/90   Take your medications faithfully as instructed. Maintain a healthy  weight. Get at least 150 minutes of aerobic exercise per week. Minimize salt intake. Minimize alcohol intake  DASH Eating Plan DASH stands for "Dietary Approaches to Stop Hypertension." The DASH eating plan is a healthy eating plan that has been shown to reduce high blood pressure (hypertension). Additional health benefits may include reducing the risk of type 2 diabetes mellitus, heart disease, and stroke. The DASH eating plan may also help with weight loss. WHAT DO I NEED TO KNOW ABOUT THE DASH EATING PLAN? For the DASH eating plan, you will follow these general guidelines:  Choose foods with a percent daily value for sodium of less than 5% (as listed on the food label).  Use salt-free seasonings or herbs instead of table salt or sea salt.  Check with your health care provider or pharmacist before using salt substitutes.  Eat lower-sodium products, often labeled as "lower sodium" or "no salt added."  Eat fresh foods.  Eat more vegetables, fruits, and low-fat dairy products.  Choose whole grains. Look for the word "whole" as the first word in the ingredient list.  Choose fish and skinless chicken or [place] more often than red meat. Limit fish, poultry, and meat to 6 oz (170 g) each day.  Limit sweets, desserts, sugars, and sugary drinks.  Choose heart-healthy fats.  Limit cheese to 1 oz (  28 g) per day.  Eat more home-cooked food and less restaurant, buffet, and fast food.  Limit fried foods.  Cook foods using methods other than frying.  Limit canned vegetables. If you do use them, rinse them well to decrease the sodium.  When eating at a restaurant, ask that your food be prepared with less salt, or no salt if possible. WHAT FOODS CAN I EAT? Seek help from a dietitian for individual calorie needs. Grains Whole grain or whole wheat bread. Brown rice. Whole grain or whole wheat pasta. Quinoa, bulgur, and whole grain cereals. Low-sodium cereals. Corn or whole wheat flour  tortillas. Whole grain cornbread. Whole grain crackers. Low-sodium crackers. Vegetables Fresh or frozen vegetables (raw, steamed, roasted, or grilled). Low-sodium or reduced-sodium tomato and vegetable juices. Low-sodium or reduced-sodium tomato sauce and paste. Low-sodium or reduced-sodium canned vegetables.  Fruits All fresh, canned (in natural juice), or frozen fruits. Meat and Other Protein Products Ground beef (85% or leaner), grass-fed beef, or beef trimmed of fat. Skinless chicken or [place]. Ground chicken or [place]. Pork trimmed of fat. All fish and seafood. Eggs. Dried beans, peas, or lentils. Unsalted nuts and seeds. Unsalted canned beans. Dairy Low-fat dairy products, such as skim or 1% milk, 2% or reduced-fat cheeses, low-fat ricotta or cottage cheese, or plain low-fat yogurt. Low-sodium or reduced-sodium cheeses. Fats and Oils Tub margarines without trans fats. Light or reduced-fat mayonnaise and salad dressings (reduced sodium). Avocado. Safflower, olive, or canola oils. Natural peanut or almond butter. Other Unsalted popcorn and pretzels. The items listed above may not be a complete list of recommended foods or beverages. Contact your dietitian for more options. WHAT FOODS ARE NOT RECOMMENDED? Grains White bread. White pasta. White rice. Refined cornbread. Bagels and croissants. Crackers that contain trans fat. Vegetables Creamed or fried vegetables. Vegetables in a cheese sauce. Regular canned vegetables. Regular canned tomato sauce and paste. Regular tomato and vegetable juices. Fruits Dried fruits. Canned fruit in light or heavy syrup. Fruit juice. Meat and Other Protein Products Fatty cuts of meat. Ribs, chicken wings, bacon, sausage, bologna, salami, chitterlings, fatback, hot dogs, bratwurst, and packaged luncheon meats. Salted nuts and seeds. Canned beans with salt. Dairy Whole or 2% milk, cream, half-and-half, and cream cheese. Whole-fat or sweetened yogurt. Full-fat  cheeses or blue cheese. Nondairy creamers and whipped toppings. Processed cheese, cheese spreads, or cheese curds. Condiments Onion and garlic salt, seasoned salt, table salt, and sea salt. Canned and packaged gravies. Worcestershire sauce. Tartar sauce. Barbecue sauce. Teriyaki sauce. Soy sauce, including reduced sodium. Steak sauce. Fish sauce. Oyster sauce. Cocktail sauce. Horseradish. Ketchup and mustard. Meat flavorings and tenderizers. Bouillon cubes. Hot sauce. Tabasco sauce. Marinades. Taco seasonings. Relishes. Fats and Oils Butter, stick margarine, lard, shortening, ghee, and bacon fat. Coconut, palm kernel, or palm oils. Regular salad dressings. Other Pickles and olives. Salted popcorn and pretzels. The items listed above may not be a complete list of foods and beverages to avoid. Contact your dietitian for more information. WHERE CAN I FIND MORE INFORMATION? National Heart, Lung, and Blood Institute: travelstabl[employer].com Document Released: 04/07/2011 Document Revised: 09/02/2013 Document Reviewed: 02/20/2013 [facility] Patient Information 2015 [place], [place]. This information is not intended to replace advice given to you by your health care provider. Make sure you discuss any questions you have with your health care provider.    Diabetes or even increased sugars put you at 300% increased risk of heart attack and stroke.  ALSO BEING DIABETIC YOU MAY NOT HAVE ANY PAIN WITH A  HEART ATTACK.  Even worse of a chance of no pain if you are a woman.  It is very unlikely that you will have any pain with a heart attack. Likely your symptoms will be very subtle, even for very severe disease.  Your symptoms for a heart attack will likely occur when you exert your self or exercise and include: Shortness of breath Sweating Nausea Dizziness Fast or irregular heart beats Fatigue   It makes me feel better if my diabetics get their heart rate up with exercise once or  twice a week and pay close attention to your body. If there is ANY change in your exercise capacity or if you have symptoms above, please STOP and call 911 or call to come to the office.   PLEASE REMEMBER:  Diabetes is preventable! Up to 63 percent of complications and morbidities among individuals with type 2 diabetes can be prevented, delayed, or effectively treated and minimized with regular visits to a health professional, appropriate monitoring and medication, and a healthy diet and lifestyle.   Here is some information to help you keep your heart healthy: Move it! - Aim for 30 mins of activity every day. Take it slowly at first. Talk to [place] before starting any new exercise program.   Lose it.  -Body Mass Index (BMI) can indicate if you need to lose weight. A healthy range is 18.5-24.9. For a BMI calculator, go to [employer].com  Waist Management -Excess abdominal fat is a risk factor for heart disease, diabetes, asthma, stroke and more. Ideal waist circumference is less than 35" for women and less than 40" for men.   Eat Right -focus on fruits, vegetables, whole grains, and meals you make yourself. Avoid foods with trans fat and high sugar/sodium content.   Snooze or Snore? - Loud snoring can be a sign of sleep apnea, a significant risk factor for high blood pressure, heart attach, stroke, and heart arrhythmias.  Kick the habit -Quit Smoking! Avoid second hand smoke. A single cigarette raises your blood pressure for 20 mins and increases the risk of heart attack and stroke for the next 24 hours.   Are Aspirin and Supplements right for you? -Add ENTERIC COATED low dose 81 mg Aspirin daily OR can do every other day if you have easy bruising to protect your heart and head. As well as to reduce risk of Colon Cancer by 20 %, Skin Cancer by 26 % , Melanoma by 46% and Pancreatic cancer by 60%  Say "No to Stress -There may be little you can do about problems that cause stress. However,  techniques such as long walks, meditation, and exercise can help you manage it.   Start Now! - Make changes one at a time and set reasonable goals to increase your likelihood of success.     Recommendations For Diabetic/Prediabetic Patients:   -  Take medications as prescribed  -  Recommend Dr Fara Olden Fuhrman's book "The End of Diabetes "  And "The End of Dieting"- Can get at  www.[employer].com and encourage also get the Audio CD book  - AVOID Animal products, ie. Meat - red/white, Poultry and Dairy/especially cheese - Exercise at least 5 times a week for 30 minutes or preferably daily.  - No Smoking - Drink less than 2 drinks a day.  - Monitor your feet for sores - Have yearly Eye Exams - Recommend annual Flu vaccine  - Recommend Pneumovax and Prevnar vaccines - Shingles Vaccine (Zostavax) if over 23 y.o.  Goals:   - BMI less than 24 - Fasting sugar less than 130 or less than 150 if tapering medicines to lose weight  - Systolic BP less than 015  - Diastolic BP less than 80 - Bad LDL Cholesterol less than 70 - Triglycerides less than 150

## 2019-12-17 LAB — CBC WITH DIFFERENTIAL/PLATELET
Absolute Monocytes: 281 cells/uL (ref 200–950)
Basophils Absolute: 41 cells/uL (ref 0–200)
Basophils Relative: 0.9 %
Eosinophils Absolute: 230 cells/uL (ref 15–500)
Eosinophils Relative: 5 %
HCT: 46.6 % (ref 38.5–50.0)
Hemoglobin: 15.8 g/dL (ref 13.2–17.1)
Lymphs Abs: 1996 cells/uL (ref 850–3900)
MCH: 28.7 pg (ref 27.0–33.0)
MCHC: 33.9 g/dL (ref 32.0–36.0)
MCV: 84.6 fL (ref 80.0–100.0)
MPV: 11.4 fL (ref 7.5–12.5)
Monocytes Relative: 6.1 %
Neutro Abs: 2052 cells/uL (ref 1500–7800)
Neutrophils Relative %: 44.6 %
Platelets: 189 10*3/uL (ref 140–400)
RBC: 5.51 10*6/uL (ref 4.20–5.80)
RDW: 12.6 % (ref 11.0–15.0)
Total Lymphocyte: 43.4 %
WBC: 4.6 10*3/uL (ref 3.8–10.8)

## 2019-12-17 LAB — FERRITIN: Ferritin: 211 ng/mL (ref 38–380)

## 2019-12-17 LAB — IRON, TOTAL/TOTAL IRON BINDING CAP
%SAT: 44 % (calc) (ref 20–48)
Iron: 109 ug/dL (ref 50–180)
TIBC: 247 mcg/dL (calc) — ABNORMAL LOW (ref 250–425)

## 2019-12-17 LAB — COMPLETE METABOLIC PANEL WITH GFR
AG Ratio: 1.7 (calc) (ref 1.0–2.5)
ALT: 24 U/L (ref 9–46)
AST: 12 U/L (ref 10–35)
Albumin: 4 g/dL (ref 3.6–5.1)
Alkaline phosphatase (APISO): 128 U/L (ref 35–144)
BUN/Creatinine Ratio: 11 (calc) (ref 6–22)
BUN: 15 mg/dL (ref 7–25)
CO2: 27 mmol/L (ref 20–32)
Calcium: 9.5 mg/dL (ref 8.6–10.3)
Chloride: 108 mmol/L (ref 98–110)
Creat: 1.39 mg/dL — ABNORMAL HIGH (ref 0.70–1.33)
GFR, Est African American: 65 mL/min/{1.73_m2} (ref >=60)
GFR, Est Non African American: 56 mL/min/{1.73_m2} — ABNORMAL LOW (ref >=60)
Globulin: 2.3 g/dL (calc) (ref 1.9–3.7)
Glucose, Bld: 175 mg/dL — ABNORMAL HIGH (ref 65–99)
Potassium: 3.9 mmol/L (ref 3.5–5.3)
Sodium: 142 mmol/L (ref 135–146)
Total Bilirubin: 0.9 mg/dL (ref 0.2–1.2)
Total Protein: 6.3 g/dL (ref 6.1–8.1)

## 2019-12-17 LAB — LIPID PANEL
Cholesterol: 141 mg/dL (ref <200)
HDL: 48 mg/dL (ref >=40)
LDL Cholesterol (Calc): 72 mg/dL (calc)
Non-HDL Cholesterol (Calc): 93 mg/dL (calc) (ref <130)
Total CHOL/HDL Ratio: 2.9 (calc) (ref <5.0)
Triglycerides: 129 mg/dL (ref <150)

## 2019-12-17 LAB — ESTRADIOL: Estradiol: 33 pg/mL (ref <=39)

## 2019-12-17 LAB — VITAMIN D 25 HYDROXY (VIT D DEFICIENCY, FRACTURES): Vit D, 25-Hydroxy: 40 ng/mL (ref 30–100)

## 2019-12-17 LAB — MAGNESIUM: Magnesium: 1.8 mg/dL (ref 1.5–2.5)

## 2019-12-17 LAB — HEMOGLOBIN A1C
Hgb A1c MFr Bld: 10.1 % of total Hgb — ABNORMAL HIGH (ref <5.7)
Mean Plasma Glucose: 243 (calc)
eAG (mmol/L): 13.5 (calc)

## 2019-12-17 LAB — TESTOSTERONE: Testosterone: 339 ng/dL (ref 250–827)

## 2019-12-17 LAB — DIHYDROTESTOSTERONE: Dihydrotestosterone LC/MS/MS: 57 ng/dL (ref 12–65)

## 2019-12-17 LAB — TSH: TSH: 1.95 mIU/L (ref 0.40–4.50)

## 2020-01-07 ENCOUNTER — Other Ambulatory Visit: Payer: Self-pay | Admitting: Internal Medicine

## 2020-02-19 LAB — HM DIABETES EYE EXAM

## 2020-03-03 ENCOUNTER — Encounter: Payer: Self-pay | Admitting: Internal Medicine

## 2020-03-04 ENCOUNTER — Other Ambulatory Visit: Payer: Self-pay | Admitting: Internal Medicine

## 2020-03-31 ENCOUNTER — Other Ambulatory Visit: Payer: Self-pay | Admitting: Internal Medicine

## 2020-04-30 ENCOUNTER — Encounter: Payer: Self-pay | Admitting: Adult Health Nurse Practitioner

## 2020-04-30 ENCOUNTER — Other Ambulatory Visit: Payer: Self-pay

## 2020-04-30 ENCOUNTER — Ambulatory Visit (INDEPENDENT_AMBULATORY_CARE_PROVIDER_SITE_OTHER): Admitting: Adult Health Nurse Practitioner

## 2020-04-30 VITALS — BP 148/92 | HR 76 | Temp 97.2°F | Resp 16 | Ht 74.0 in | Wt 315.6 lb

## 2020-04-30 DIAGNOSIS — Z136 Encounter for screening for cardiovascular disorders: Secondary | ICD-10-CM | POA: Diagnosis not present

## 2020-04-30 DIAGNOSIS — Z13220 Encounter for screening for lipoid disorders: Secondary | ICD-10-CM

## 2020-04-30 DIAGNOSIS — Z79899 Other long term (current) drug therapy: Secondary | ICD-10-CM

## 2020-04-30 DIAGNOSIS — Z130 Encounter for screening for diseases of the blood and blood-forming organs and certain disorders involving the immune mechanism: Secondary | ICD-10-CM | POA: Diagnosis not present

## 2020-04-30 DIAGNOSIS — Z131 Encounter for screening for diabetes mellitus: Secondary | ICD-10-CM | POA: Diagnosis not present

## 2020-04-30 DIAGNOSIS — Z1389 Encounter for screening for other disorder: Secondary | ICD-10-CM | POA: Diagnosis not present

## 2020-04-30 DIAGNOSIS — I10 Essential (primary) hypertension: Secondary | ICD-10-CM | POA: Diagnosis not present

## 2020-04-30 DIAGNOSIS — E1121 Type 2 diabetes mellitus with diabetic nephropathy: Secondary | ICD-10-CM

## 2020-04-30 DIAGNOSIS — N401 Enlarged prostate with lower urinary tract symptoms: Secondary | ICD-10-CM | POA: Diagnosis not present

## 2020-04-30 DIAGNOSIS — E785 Hyperlipidemia, unspecified: Secondary | ICD-10-CM

## 2020-04-30 DIAGNOSIS — Z0000 Encounter for general adult medical examination without abnormal findings: Secondary | ICD-10-CM | POA: Diagnosis not present

## 2020-04-30 DIAGNOSIS — N138 Other obstructive and reflux uropathy: Secondary | ICD-10-CM

## 2020-04-30 DIAGNOSIS — R350 Frequency of micturition: Secondary | ICD-10-CM | POA: Diagnosis not present

## 2020-04-30 DIAGNOSIS — E349 Endocrine disorder, unspecified: Secondary | ICD-10-CM

## 2020-04-30 DIAGNOSIS — Z125 Encounter for screening for malignant neoplasm of prostate: Secondary | ICD-10-CM

## 2020-04-30 DIAGNOSIS — E1169 Type 2 diabetes mellitus with other specified complication: Secondary | ICD-10-CM

## 2020-04-30 DIAGNOSIS — N521 Erectile dysfunction due to diseases classified elsewhere: Secondary | ICD-10-CM

## 2020-04-30 DIAGNOSIS — Z1321 Encounter for screening for nutritional disorder: Secondary | ICD-10-CM

## 2020-04-30 DIAGNOSIS — E1122 Type 2 diabetes mellitus with diabetic chronic kidney disease: Secondary | ICD-10-CM

## 2020-04-30 DIAGNOSIS — E559 Vitamin D deficiency, unspecified: Secondary | ICD-10-CM

## 2020-04-30 DIAGNOSIS — Z0001 Encounter for general adult medical examination with abnormal findings: Secondary | ICD-10-CM

## 2020-04-30 DIAGNOSIS — Z87442 Personal history of urinary calculi: Secondary | ICD-10-CM

## 2020-04-30 DIAGNOSIS — G4733 Obstructive sleep apnea (adult) (pediatric): Secondary | ICD-10-CM

## 2020-04-30 DIAGNOSIS — N183 Chronic kidney disease, stage 3 unspecified: Secondary | ICD-10-CM

## 2020-04-30 NOTE — Progress Notes (Signed)
COMPLETE PHYSICAL   Assessment and Plan:  Encounter for general adult medical examination with abnormal findings -     CBC with Diff -     COMPLETE METABOLIC PANEL WITH GFR -     TSH -     Lipid Profile -     Hemoglobin A1c ([facility]) -     Magnesium -     Vitamin D (25 hydroxy) -     Urinalysis, Routine w reflex microscopic -     Microalbumin / Creatinine Urine Ratio -     EKG 12-Lead -     [place], retroperitnl abd,  ltd -     Testosterone, Total -     PSA  Type 2 diabetes mellitus with diabetic nephropathy, without long-term current use of insulin (HCC) -     Hemoglobin A1c ([facility]) May add on GLP if covered, if not will switch to 70/30 Needs to start checking sugars.   Hyperlipidemia associated with type 2 diabetes mellitus (HCC) -     Lipid Profile check lipids decrease fatty foods increase activity.   Morbid obesity ([place]) - follow up 3 months for progress monitoring - increase veggies, decrease carbs - long discussion about weight loss, diet, and exercise  CKD stage 3 due to type 2 diabetes mellitus ([place]) Discussed general issues about diabetes pathophysiology and management., Educational material distributed., Suggested low cholesterol diet., Encouraged aerobic exercise., Discussed foot care., Reminded to get yearly retinal exam. May add on GLP if covered, if not will switch to 70/30 Needs to start checking sugars.   Type 2 diabetes mellitus with hyperlipidemia (HCC) check lipids decrease fatty foods increase activity.   Erectile dysfunction associated with type 2 diabetes mellitus ([place]) Need to get better control of sugars  Hereditary hemochromatosis ([place]) Monitor labs, need to add on iron/ferritin  Essential hypertension - continue medications, DASH diet, exercise and monitor at home. Call if greater than 130/80.  -     CBC with Diff -     COMPLETE METABOLIC PANEL WITH GFR -     TSH -     Urinalysis, Routine w reflex microscopic -     Microalbumin /  Creatinine Urine Ratio -     EKG 12-Lead -     [place], retroperitnl abd,  ltd  OSA on CPAP Sleep apnea- continue CPAP, CPAP is helping with daytime fatigue, weight loss still advised.   Medication management -     Magnesium  Vitamin D deficiency -     Vitamin D (25 hydroxy)  Testosterone deficiency -     Testosterone, Total  History of nephrolithiasis Increase fluids  BPH with obstruction/lower urinary tract symptoms Monitor  Screening PSA (prostate specific antigen) -     PSA  Screening, ischemic heart disease -     EKG 12-Lead  Screening for blood or protein in urine -     Urinalysis w microscopic + reflex cultur  Encounter for vitamin deficiency screening -     Vitamin B12   Discussed med's effects and SE's. Screening labs and tests as requested with regular follow-up as recommended.   HPI 59 y.o. male  presents for a complete physical.  His blood pressure is not checked at home, he is on lisinopril 20 mg, today their BP is BP: (!) 148/92 He does not workout. Marland Kitchen He denies chest pain, shortness of breath, dizziness.  BMI is Body mass index is 40.52 kg/m., he is working on diet and exercise. He is on phentermine and  topamax for weight loss x 6 months, feels that it is helping.  Has OSA and is on CPAP. Wt Readings from Last 3 Encounters:  04/30/20 (!) 315 lb 9.6 oz (143.2 kg)  12/13/19 (!) 318 lb (144.2 kg)  08/07/19 (!) 318 lb (144.2 kg)   He has been working on diet and exercise for diabetes  with CKD he is on ACE Hyperlipidemia not statin, cholesterol IS at goal less than 70 ED, has viagra He is on ASA He is on lantus 40 units and Ozempic $RemoveBef'1mg'acpkyWjimc$  weekly He is on amaryl 4 mg 1 pill BID MF 2000 mg a day total Eye exam: Dr. Sabra Heck 2021 Meter: Freestyle has been running well at home in the morning, 120's-130's. denies paresthesia of the feet, polydipsia and polyuria.  Last A1C in the office was:  Lab Results  Component Value Date   HGBA1C 10.1 (H) 12/13/2019    Lab Results  Component Value Date   GFRAA 65 12/13/2019   Lab Results  Component Value Date   CHOL 141 12/13/2019   HDL 48 12/13/2019   LDLCALC 72 12/13/2019   TRIG 129 12/13/2019   CHOLHDL 2.9 12/13/2019   Patient is on Vitamin D supplement, increase to 10000 IU daily last visit.  Lab Results  Component Value Date   VD25OH 31 12/13/2019   He is has a history of testosterone deficiency and is on testosterone replacement at this time, only on 2 pumps each arm a day He states that the testosterone is help with his energy, libido, muscle mass.  Lab Results  Component Value Date   TESTOSTERONE 339 12/13/2019   Has history of kidney stone, follows with Dr. Louis Meckel.  Current Medications:   Current Outpatient Medications (Endocrine & Metabolic):  .  glimepiride (AMARYL) 4 MG tablet, Take 1 tablet 2 x /day with Meals for Diabetes .  insulin glargine (LANTUS SOLOSTAR) 100 UNIT/ML Solostar Pen, INJECT 30 TO 40 UNITS DAILY FOR DIABETES .  metFORMIN (GLUCOPHAGE-XR) 500 MG 24 hr tablet, Take 2 tablets 2 x /day with Meals for Diabetes .  Semaglutide, 1 MG/DOSE, (OZEMPIC, 1 MG/DOSE,) 2 MG/1.5ML SOPN, Inject 0.75 mLs (1 mg total) into the skin once a week. .  Testosterone 20.25 MG/ACT (1.62%) GEL, Apply 4 pumps Daily  Current Outpatient Medications (Cardiovascular):  .  lisinopril (PRINIVIL,ZESTRIL) 20 MG tablet, Take 1 tablet (20 mg total) by mouth daily. .  sildenafil (VIAGRA) 100 MG tablet, take half to one tablet by mouth daily if needed for xxxx   Current Outpatient Medications (Analgesics):  .  aspirin EC 81 MG tablet, Take 81 mg by mouth daily.   Current Outpatient Medications (Other):  Marland Kitchen  Cholecalciferol (VITAMIN D-3) 125 MCG (5000 UT) TABS, Take 15,000 Units by mouth daily. (Patient taking differently: Take by mouth 2 (two) times daily.) .  CINNAMON PO, Take 1,000 mg by mouth. Takes 2 tablets twice a day. Marland Kitchen  glucose blood (FREESTYLE LITE) test strip, CHECK BLOOD SUGAR 1 TIME  A DAY-dx-E11.29. .  glucose monitoring kit (FREESTYLE) monitoring kit, Check blood sugar 1 time a day-DX-E11.29 .  Lancets (FREESTYLE) lancets, CHECK BLOOD SUGAR 1 TIME A DAY-DX-E11.29. Marland Kitchen  phentermine (ADIPEX-P) 37.5 MG tablet, Take 1/2 to 1 tablet every morning for dieting & weight  loss  Health Maintenance:  Immunization History  Administered Date(s) Administered  . DTaP 04/02/2012  . Influenza Inj Mdck Quad With Preservative 03/20/2018  . Influenza Split 02/17/2014  . Influenza-Unspecified 01/13/2017  . PFIZER SARS-COV-2 Vaccination 08/22/2019,  09/16/2019  . PPD Test 06/05/2018  . Pneumococcal Polysaccharide-23 04/02/2012   Tetanus: 2013 Pneumovax: 2013 Prevnar 13: due age 40 Flu vaccine: 2021,  Zostavax: N/A  DEXA: N/A Colonoscopy: 01/05/2017 Dr Hilarie Fredrickson Scheudled Jan EGD: N/A  Eye exam 2021, Dr. Sabra Heck  Dentist: [facility], [address] Urologist: Dr. Louis Meckel, PRN Endocrinologist: Dr. Cruzita Lederer- has seen in the past, no longer seeing  Medical History:  Past Medical History:  Diagnosis Date  . Erectile dysfunction 09/11/2014  . History of kidney stones   . Hypogonadism male   . Left ureteral stone   . OSA on CPAP   . Right bundle branch block   . Sleep apnea   . Type 2 diabetes mellitus (HCC)    Allergies No Known Allergies  SURGICAL HISTORY He  has a past surgical history that includes Ankle surgery (Left, 2002); Rhinoplasty (2001); Extracorporeal shock wave lithotripsy (Left, 09-25-2014); Cystoscopy/retrograde/ureteroscopy/stone extraction with basket (Left, 12/05/2014); and Cystoscopy w/ retrogrades (Left, 12/05/2014).   FAMILY HISTORY His family history includes Cancer in his mother; Diabetes in his father; Hypertension in his mother.   SOCIAL HISTORY He  reports that he has never smoked. He has never used smokeless tobacco. He reports current alcohol use. He reports that he does not use drugs.   Review of Systems  Constitutional: Positive for malaise/fatigue.  Negative for chills, diaphoresis, fever and weight loss.  HENT: Negative.   Eyes: Negative.   Respiratory: Negative.  Negative for shortness of breath.   Cardiovascular: Negative.  Negative for chest pain and claudication.  Gastrointestinal: Negative.  Negative for abdominal pain, blood in stool, constipation, diarrhea and melena.  Genitourinary: Negative.   Musculoskeletal: Negative.   Skin: Negative.   Neurological: Negative.  Negative for weakness.  Psychiatric/Behavioral: Negative.    Physical Exam: Estimated body mass index is 40.52 kg/m as calculated from the following:   Height as of this encounter: $RemoveBeforeD'6\' 2"'quGYNySwImpfyC$  (1.88 m).   Weight as of this encounter: 315 lb 9.6 oz (143.2 kg). BP (!) 148/92   Pulse 76   Temp (!) 97.2 F (36.2 C)   Resp 16   Ht $R'6\' 2"'gs$  (1.88 m)   Wt (!) 315 lb 9.6 oz (143.2 kg)   SpO2 96%   BMI 40.52 kg/m  General Appearance: Well nourished, in no apparent distress. Eyes: PERRLA, EOMs, conjunctiva no swelling or erythema, normal fundi and vessels. Sinuses: No Frontal/maxillary tenderness ENT/Mouth: Ext aud canals clear after removal in the office, normal light reflex with TMs without erythema after bilateral ears were cleaned in the office, bulging. Good dentition. No erythema, swelling, or exudate on post pharynx. Tonsils not swollen or erythematous. Hearing normal.  Neck: Supple, thyroid normal. No bruits Respiratory: Respiratory effort normal, BS equal bilaterally without rales, rhonchi, wheezing or stridor. Cardio: RRR without murmurs, rubs or gallops. Brisk peripheral pulses without edema.  Chest: symmetric, with normal excursions and percussion. Abdomen: Soft, +BS. Non tender, no guarding, rebound, hernias, masses, or organomegaly. .  Lymphatics: Non tender without lymphadenopathy.  Genitourinary: defer urologist Musculoskeletal: Full ROM all peripheral extremities,5/5 strength, and normal gait.Marland Kitchen  Neuro: Cranial nerves intact, reflexes equal bilaterally.  Normal muscle tone, no cerebellar symptoms. Sensation intact.  Psych: Awake and oriented X 3, normal affect, Insight and Judgment appropriate.  Skin: dark nevus left medial foot, will monitor, pictures taken. Warm, dry without rashes, lesions, ecchymosis  EKG: RBBB, occ PVC, no ST changes  AORTA SCAN: defer    Bayard Males, DNP [facility]  04/30/2020  5:18 PM

## 2020-05-01 LAB — COMPLETE METABOLIC PANEL WITH GFR
AG Ratio: 1.7 (calc) (ref 1.0–2.5)
ALT: 36 U/L (ref 9–46)
AST: 14 U/L (ref 10–35)
Albumin: 4.2 g/dL (ref 3.6–5.1)
Alkaline phosphatase (APISO): 150 U/L — ABNORMAL HIGH (ref 35–144)
BUN/Creatinine Ratio: 12 (calc) (ref 6–22)
BUN: 19 mg/dL (ref 7–25)
CO2: 28 mmol/L (ref 20–32)
Calcium: 9.9 mg/dL (ref 8.6–10.3)
Chloride: 105 mmol/L (ref 98–110)
Creat: 1.59 mg/dL — ABNORMAL HIGH (ref 0.70–1.33)
GFR, Est African American: 55 mL/min/{1.73_m2} — ABNORMAL LOW (ref >=60)
GFR, Est Non African American: 47 mL/min/{1.73_m2} — ABNORMAL LOW (ref >=60)
Globulin: 2.5 g/dL (calc) (ref 1.9–3.7)
Glucose, Bld: 200 mg/dL — ABNORMAL HIGH (ref 65–139)
Potassium: 4.4 mmol/L (ref 3.5–5.3)
Sodium: 142 mmol/L (ref 135–146)
Total Bilirubin: 0.9 mg/dL (ref 0.2–1.2)
Total Protein: 6.7 g/dL (ref 6.1–8.1)

## 2020-05-01 LAB — CBC WITH DIFFERENTIAL/PLATELET
Absolute Monocytes: 351 cells/uL (ref 200–950)
Basophils Absolute: 38 cells/uL (ref 0–200)
Basophils Relative: 0.7 %
Eosinophils Absolute: 189 cells/uL (ref 15–500)
Eosinophils Relative: 3.5 %
HCT: 48.5 % (ref 38.5–50.0)
Hemoglobin: 16.3 g/dL (ref 13.2–17.1)
Lymphs Abs: 2441 cells/uL (ref 850–3900)
MCH: 28.8 pg (ref 27.0–33.0)
MCHC: 33.6 g/dL (ref 32.0–36.0)
MCV: 85.7 fL (ref 80.0–100.0)
MPV: 11.4 fL (ref 7.5–12.5)
Monocytes Relative: 6.5 %
Neutro Abs: 2381 cells/uL (ref 1500–7800)
Neutrophils Relative %: 44.1 %
Platelets: 236 10*3/uL (ref 140–400)
RBC: 5.66 10*6/uL (ref 4.20–5.80)
RDW: 12.5 % (ref 11.0–15.0)
Total Lymphocyte: 45.2 %
WBC: 5.4 10*3/uL (ref 3.8–10.8)

## 2020-05-01 LAB — URINALYSIS W MICROSCOPIC + REFLEX CULTURE
Bacteria, UA: NONE SEEN /HPF
Bilirubin Urine: NEGATIVE
Hgb urine dipstick: NEGATIVE
Hyaline Cast: NONE SEEN /LPF
Ketones, ur: NEGATIVE
Leukocyte Esterase: NEGATIVE
Nitrites, Initial: NEGATIVE
RBC / HPF: NONE SEEN /HPF (ref 0–2)
Specific Gravity, Urine: 1.026 (ref 1.001–1.03)
Squamous Epithelial / HPF: NONE SEEN /HPF (ref <=5)
WBC, UA: NONE SEEN /HPF (ref 0–5)
pH: <=5 (ref 5.0–8.0)

## 2020-05-01 LAB — LIPID PANEL
Cholesterol: 153 mg/dL (ref <200)
HDL: 46 mg/dL (ref >=40)
LDL Cholesterol (Calc): 76 mg/dL (calc)
Non-HDL Cholesterol (Calc): 107 mg/dL (calc) (ref <130)
Total CHOL/HDL Ratio: 3.3 (calc) (ref <5.0)
Triglycerides: 213 mg/dL — ABNORMAL HIGH (ref <150)

## 2020-05-01 LAB — HEMOGLOBIN A1C
Hgb A1c MFr Bld: 8.8 % of total Hgb — ABNORMAL HIGH (ref <5.7)
Mean Plasma Glucose: 206 mg/dL
eAG (mmol/L): 11.4 mmol/L

## 2020-05-01 LAB — FERRITIN: Ferritin: 231 ng/mL (ref 38–380)

## 2020-05-01 LAB — IRON, TOTAL/TOTAL IRON BINDING CAP
%SAT: 34 % (calc) (ref 20–48)
Iron: 81 ug/dL (ref 50–180)
TIBC: 240 mcg/dL (calc) — ABNORMAL LOW (ref 250–425)

## 2020-05-01 LAB — MAGNESIUM: Magnesium: 1.9 mg/dL (ref 1.5–2.5)

## 2020-05-01 LAB — MICROALBUMIN / CREATININE URINE RATIO
Creatinine, Urine: 154 mg/dL (ref 20–320)
Microalb Creat Ratio: 488 mcg/mg creat — ABNORMAL HIGH (ref <30)
Microalb, Ur: 75.2 mg/dL

## 2020-05-01 LAB — VITAMIN B12: Vitamin B-12: 487 pg/mL (ref 200–1100)

## 2020-05-01 LAB — VITAMIN D 25 HYDROXY (VIT D DEFICIENCY, FRACTURES): Vit D, 25-Hydroxy: 42 ng/mL (ref 30–100)

## 2020-05-01 LAB — TSH: TSH: 1.31 mIU/L (ref 0.40–4.50)

## 2020-05-01 LAB — PSA: PSA: 6.65 ng/mL — ABNORMAL HIGH (ref <=4.0)

## 2020-05-01 LAB — NO CULTURE INDICATED

## 2020-05-02 ENCOUNTER — Other Ambulatory Visit: Payer: Self-pay | Admitting: Adult Health Nurse Practitioner

## 2020-05-02 DIAGNOSIS — R972 Elevated prostate specific antigen [PSA]: Secondary | ICD-10-CM

## 2020-05-12 ENCOUNTER — Other Ambulatory Visit: Payer: Self-pay | Admitting: Adult Health Nurse Practitioner

## 2020-05-19 ENCOUNTER — Other Ambulatory Visit: Payer: Self-pay | Admitting: Internal Medicine

## 2020-05-25 ENCOUNTER — Other Ambulatory Visit: Payer: Self-pay | Admitting: Adult Health Nurse Practitioner

## 2020-05-28 ENCOUNTER — Encounter

## 2020-06-04 ENCOUNTER — Encounter: Payer: BLUE CROSS/BLUE SHIELD | Admitting: Internal Medicine

## 2020-06-18 ENCOUNTER — Telehealth: Payer: Self-pay

## 2020-06-18 ENCOUNTER — Ambulatory Visit (AMBULATORY_SURGERY_CENTER)

## 2020-06-18 ENCOUNTER — Other Ambulatory Visit: Payer: Self-pay

## 2020-06-18 VITALS — Ht 74.0 in | Wt 289.0 lb

## 2020-06-18 DIAGNOSIS — Z86010 Personal history of colonic polyps: Secondary | ICD-10-CM

## 2020-06-18 MED ORDER — SUTAB 1479-225-188 MG PO TABS: 12.0000 | ORAL_TABLET | ORAL | 0 refills | Status: DC

## 2020-06-18 NOTE — Telephone Encounter (Signed)
John:  Post PV today, I realized that this pt's phentermine may be a problem.  His previsit instructions today told him to stop phentermine for 10 days with his last dose of it being  2/14.    If he stops today, he could be 4 days into the 10 day period.  Do we need to reschedule his colonoscopy?    Thank you

## 2020-06-18 NOTE — Progress Notes (Signed)
Pt verified name, DOB, address and insurance during PV today.     Pt mailed instruction packet to included paper to complete and mail back to [facility] with addressed and stamped envelope, Emmi video, copy of consent form to read and not return, and instructions. [employer] coupon mailed in packet. PV completed over the phone. Pt encouraged to call with questions or issues   No allergies to soy or egg Pt is not on blood thinners or diet pills Denies issues with sedation/intubation Denies atrial flutter/fib Denies constipation   Emmi instructions given to pt  Pt is aware of Covid safety and care partner requirements.

## 2020-06-19 NOTE — Telephone Encounter (Signed)
Terri,  As long as he has stopped it we can proceed as scheduled.  Thanks,  Osvaldo Angst

## 2020-06-26 ENCOUNTER — Encounter: Payer: Self-pay | Admitting: Internal Medicine

## 2020-06-26 ENCOUNTER — Ambulatory Visit (AMBULATORY_SURGERY_CENTER): Admitting: Internal Medicine

## 2020-06-26 ENCOUNTER — Other Ambulatory Visit: Payer: Self-pay

## 2020-06-26 VITALS — BP 154/89 | HR 85 | Temp 97.9°F | Resp 17 | Ht 74.0 in | Wt 289.0 lb

## 2020-06-26 DIAGNOSIS — D123 Benign neoplasm of transverse colon: Secondary | ICD-10-CM | POA: Diagnosis not present

## 2020-06-26 DIAGNOSIS — Z860100 Personal history of colon polyps, unspecified: Secondary | ICD-10-CM

## 2020-06-26 DIAGNOSIS — D124 Benign neoplasm of descending colon: Secondary | ICD-10-CM

## 2020-06-26 MED ORDER — SODIUM CHLORIDE 0.9 % IV SOLN
500.0000 mL | Freq: Once | INTRAVENOUS | Status: DC
Start: 2020-06-26 — End: 2020-06-26

## 2020-06-26 NOTE — Op Note (Signed)
[place] Patient Name: Carlos [name] Procedure Date: 06/26/2020 3:04 PM MRN: [id number] Endoscopist: Jerene Bears , MD Age: 60 Referring MD:  Date of Birth: [birth year] Gender: Male Account #: [id number] Procedure:                Colonoscopy Indications:              High risk colon cancer surveillance: Personal                            history of multiple (4) adenomas, Last colonoscopy:                            September 2018 Medicines:                Monitored Anesthesia Care Procedure:                Pre-Anesthesia Assessment:                           - Prior to the procedure, a History and Physical                            was performed, and patient medications and                            allergies were reviewed. The patient's tolerance of                            previous anesthesia was also reviewed. The risks                            and benefits of the procedure and the sedation                            options and risks were discussed with the patient.                            All questions were answered, and informed consent                            was obtained. Prior Anticoagulants: The patient has                            taken no previous anticoagulant or antiplatelet                            agents. ASA Grade Assessment: II - A patient with                            mild systemic disease. After reviewing the risks                            and benefits, the patient was deemed in  satisfactory condition to undergo the procedure.                           After obtaining informed consent, the colonoscope                            was passed under direct vision. Throughout the                            procedure, the patient's blood pressure, pulse, and                            oxygen saturations were monitored continuously. The                            Olympus [id number]) [id number] was introduced                             through the anus and advanced to the cecum,                            identified by the appendiceal orifice. The                            colonoscopy was performed without difficulty. The                            patient tolerated the procedure well. The quality                            of the bowel preparation was good. The ileocecal                            valve, appendiceal orifice, and rectum were                            photographed. Scope In: 3:08:01 PM Scope Out: 3:27:00 PM Scope Withdrawal Time: 0 hours 16 minutes 13 seconds  Total Procedure Duration: 0 hours 18 minutes 59 seconds  Findings:                 The digital rectal exam was normal.                           A 5 mm polyp was found in the transverse colon. The                            polyp was sessile. The polyp was removed with a                            cold snare. Resection and retrieval were complete.                           Three sessile polyps were found in the descending  colon. The polyps were 5 to 10 mm in size. These                            polyps were removed with a cold snare. Resection                            and retrieval were complete.                           The exam was otherwise without abnormality on                            direct and retroflexion views. Complications:            No immediate complications. Estimated Blood Loss:     Estimated blood loss was minimal. Impression:               - One 5 mm polyp in the transverse colon, removed                            with a cold snare. Resected and retrieved.                           - Three polyps measuring 10 mm, 6 mm, and 5 mm in                            the descending colon, removed with a cold snare.                            Resected and retrieved.                           - The examination was otherwise normal on direct                            and  retroflexion views. Recommendation:           - Patient has a contact number available for                            emergencies. The signs and symptoms of potential                            delayed complications were discussed with the                            patient. Return to normal activities tomorrow.                            Written discharge instructions were provided to the                            patient.                           -  Resume previous diet.                           - Continue present medications.                           - Await pathology results.                           - Repeat colonoscopy is recommended for                            surveillance. The colonoscopy date will be                            determined after pathology results from today's                            exam become available for review. Jerene Bears, MD 06/26/2020 3:30:27 PM This report has been signed electronically.

## 2020-06-26 NOTE — Progress Notes (Signed)
Called to room to assist during endoscopic procedure.  Patient ID and intended procedure confirmed with present staff. Received instructions for my participation in the procedure from the performing physician.  

## 2020-06-26 NOTE — Progress Notes (Signed)
Report given to PACU, vss 

## 2020-06-26 NOTE — Progress Notes (Signed)
No problems noted in the recovery room. maw 

## 2020-06-26 NOTE — Progress Notes (Signed)
Pt's states no medical or surgical changes since previsit or office visit. 

## 2020-06-26 NOTE — Patient Instructions (Addendum)
Handout was given to your care partner on polyps. Your sugar was 100 in the recovery room. You may resume your current medications today. Await biopsy results.  May take 1-3 weeks to receive pathology results. Please call if any questions or concerns.     YOU HAD AN ENDOSCOPIC PROCEDURE TODAY AT [facility]:   Refer to the procedure report that was given to you for any specific questions about what was found during the examination.  If the procedure report does not answer your questions, please call your gastroenterologist to clarify.  If you requested that your care partner not be given the details of your procedure findings, then the procedure report has been included in a sealed envelope for you to review at your convenience later.  YOU SHOULD EXPECT: Some feelings of bloating in the abdomen. Passage of more gas than usual.  Walking can help get rid of the air that was put into your GI tract during the procedure and reduce the bloating. If you had a lower endoscopy (such as a colonoscopy or flexible sigmoidoscopy) you may notice spotting of blood in your stool or on the toilet paper. If you underwent a bowel prep for your procedure, you may not have a normal bowel movement for a few days.  Please Note:  You might notice some irritation and congestion in your nose or some drainage.  This is from the oxygen used during your procedure.  There is no need for concern and it should clear up in a day or so.  SYMPTOMS TO REPORT IMMEDIATELY:   Following lower endoscopy (colonoscopy or flexible sigmoidoscopy):  Excessive amounts of blood in the stool  Significant tenderness or worsening of abdominal pains  Swelling of the abdomen that is new, acute  Fever of 100F or higher    For urgent or emergent issues, a gastroenterologist can be reached at any hour by calling [phone]. Do not use MyChart messaging for urgent concerns.    DIET:  We do recommend a small meal at  first, but then you may proceed to your regular diet.  Drink plenty of fluids but you should avoid alcoholic beverages for 24 hours.  ACTIVITY:  You should plan to take it easy for the rest of today and you should NOT DRIVE or use heavy machinery until tomorrow (because of the sedation medicines used during the test).    FOLLOW UP: Our staff will call the number listed on your records 48-72 hours following your procedure to check on you and address any questions or concerns that you may have regarding the information given to you following your procedure. If we do not reach you, we will leave a message.  We will attempt to reach you two times.  During this call, we will ask if you have developed any symptoms of COVID 19. If you develop any symptoms (ie: fever, flu-like symptoms, shortness of breath, cough etc.) before then, please call [phone].  If you test positive for Covid 19 in the 2 weeks post procedure, please call and report this information to [place].    If any biopsies were taken you will be contacted by phone or by letter within the next 1-3 weeks.  Please call us at [phone] if you have not heard about the biopsies in 3 weeks.    SIGNATURES/CONFIDENTIALITY: You and/or your care partner have signed paperwork which will be entered into your electronic medical record.  These signatures attest to the fact that that  the information above on your After Visit Summary has been reviewed and is understood.  Full responsibility of the confidentiality of this discharge information lies with you and/or your care-partner.

## 2020-06-26 NOTE — Progress Notes (Signed)
C.W. vital signs. 

## 2020-06-30 ENCOUNTER — Telehealth: Payer: Self-pay

## 2020-06-30 ENCOUNTER — Telehealth: Payer: Self-pay | Admitting: *Deleted

## 2020-06-30 NOTE — Telephone Encounter (Signed)
  Follow up Call-  Call back number 06/26/2020  Post procedure Call Back phone  # [phone]  Permission to leave phone message Yes  Some recent data might be hidden    LMOM to call back with any questions or concerns.  Also, call back if patient has developed fever, respiratory issues or been dx with COVID or had any family members or close contacts diagnosed since her procedure.

## 2020-06-30 NOTE — Telephone Encounter (Signed)
First attempt follow up call to pt, lm on vm 

## 2020-07-06 ENCOUNTER — Encounter: Payer: Self-pay | Admitting: Internal Medicine

## 2020-07-20 ENCOUNTER — Encounter: Payer: Self-pay | Admitting: Adult Health Nurse Practitioner

## 2020-07-20 ENCOUNTER — Other Ambulatory Visit: Payer: Self-pay | Admitting: Adult Health Nurse Practitioner

## 2020-07-20 NOTE — Progress Notes (Signed)
LVM this evening regarding Carrington Clamp.  Was given samples, when was last dose? Need follow up potassium level from patient to send in prescription.  Patient to call back with information.  If has taken for 4 weeks, come for lab only.   Garnet Sierras, Laqueta Jean, DNP [facility] 07/20/2020  5:18 PM

## 2020-07-29 ENCOUNTER — Ambulatory Visit: Admitting: Adult Health Nurse Practitioner

## 2020-08-29 ENCOUNTER — Other Ambulatory Visit: Payer: Self-pay | Admitting: Internal Medicine

## 2020-09-03 ENCOUNTER — Other Ambulatory Visit: Payer: Self-pay | Admitting: Urology

## 2020-09-03 DIAGNOSIS — R972 Elevated prostate specific antigen [PSA]: Secondary | ICD-10-CM

## 2020-09-11 ENCOUNTER — Other Ambulatory Visit: Payer: Self-pay | Admitting: Internal Medicine

## 2020-09-11 DIAGNOSIS — E1122 Type 2 diabetes mellitus with diabetic chronic kidney disease: Secondary | ICD-10-CM

## 2020-09-30 ENCOUNTER — Other Ambulatory Visit: Payer: Self-pay | Admitting: Internal Medicine

## 2020-09-30 ENCOUNTER — Ambulatory Visit
Admission: RE | Admit: 2020-09-30 | Discharge: 2020-09-30 | Disposition: A | Discharge status: Home / Self Care | Source: Ambulatory Visit | Attending: Urology | Admitting: Urology

## 2020-09-30 DIAGNOSIS — R972 Elevated prostate specific antigen [PSA]: Secondary | ICD-10-CM

## 2020-09-30 MED ORDER — GADOBENATE DIMEGLUMINE 529 MG/ML IV SOLN
20.0000 mL | Freq: Once | INTRAVENOUS | Status: AC | PRN
  Administered 2020-09-30: 20 mL via INTRAVENOUS

## 2020-10-08 ENCOUNTER — Other Ambulatory Visit: Payer: Self-pay | Admitting: Internal Medicine

## 2020-10-11 ENCOUNTER — Other Ambulatory Visit: Payer: Self-pay | Admitting: Internal Medicine

## 2020-10-11 MED ORDER — SILDENAFIL CITRATE 100 MG PO TABS: ORAL_TABLET | ORAL | 0 refills | Status: DC

## 2020-11-10 ENCOUNTER — Other Ambulatory Visit: Payer: Self-pay | Admitting: Internal Medicine

## 2020-11-10 DIAGNOSIS — E349 Endocrine disorder, unspecified: Secondary | ICD-10-CM

## 2020-11-10 MED ORDER — TESTOSTERONE 20.25 MG/ACT (1.62%) TD GEL: TRANSDERMAL | 2 refills | Status: DC

## 2021-01-25 ENCOUNTER — Encounter: Payer: Self-pay | Admitting: Nurse Practitioner

## 2021-01-25 ENCOUNTER — Ambulatory Visit (INDEPENDENT_AMBULATORY_CARE_PROVIDER_SITE_OTHER): Admitting: Nurse Practitioner

## 2021-01-25 ENCOUNTER — Other Ambulatory Visit: Payer: Self-pay

## 2021-01-25 VITALS — BP 100/70 | HR 82 | Temp 97.9°F | Wt 306.2 lb

## 2021-01-25 DIAGNOSIS — L723 Sebaceous cyst: Secondary | ICD-10-CM

## 2021-01-25 DIAGNOSIS — D239 Other benign neoplasm of skin, unspecified: Secondary | ICD-10-CM

## 2021-01-25 DIAGNOSIS — E1121 Type 2 diabetes mellitus with diabetic nephropathy: Secondary | ICD-10-CM

## 2021-01-25 DIAGNOSIS — E1169 Type 2 diabetes mellitus with other specified complication: Secondary | ICD-10-CM | POA: Diagnosis not present

## 2021-01-25 DIAGNOSIS — L089 Local infection of the skin and subcutaneous tissue, unspecified: Secondary | ICD-10-CM

## 2021-01-25 DIAGNOSIS — N183 Chronic kidney disease, stage 3 unspecified: Secondary | ICD-10-CM | POA: Diagnosis not present

## 2021-01-25 DIAGNOSIS — E785 Hyperlipidemia, unspecified: Secondary | ICD-10-CM

## 2021-01-25 DIAGNOSIS — I10 Essential (primary) hypertension: Secondary | ICD-10-CM

## 2021-01-25 DIAGNOSIS — E1122 Type 2 diabetes mellitus with diabetic chronic kidney disease: Secondary | ICD-10-CM

## 2021-01-25 MED ORDER — CEPHALEXIN 500 MG PO CAPS: 500.0000 mg | ORAL_CAPSULE | Freq: Three times a day (TID) | ORAL | 0 refills | Status: DC

## 2021-01-25 MED ORDER — CEPHALEXIN 500 MG PO CAPS: 500.0000 mg | ORAL_CAPSULE | Freq: Two times a day (BID) | ORAL | 0 refills | Status: AC

## 2021-01-25 NOTE — Patient Instructions (Signed)
Epidermoid Cyst Drainage Epidermoid cyst drainage is a procedure to drain a fluid-filled sac that forms under your skin (epidermoid cyst). This type of cyst is filled with a thick, oily substance that is secreted by your skin glands. Epidermoid cysts are usually painless. You can often move the cyst under your skin. Sometimes an epidermoid cyst gets inflamed. It may become red, swollen, and painful. In this case, you may need this procedure to drain the cyst and provide relief from the discomfort caused by an inflamed cyst. Cysts that are treated only with drainage often come back (recur). You may need to have the cyst completely removed after healing from this procedure. Tell a health care provider about: Any allergies you have. All medicines you are taking, including vitamins, herbs, eye drops, creams, and over-the-counter medicines. Any problems you or family members have had with anesthetic medicines. Any blood disorders you have. Any surgeries you have had. Any medical conditions you have. Whether you are pregnant or may be pregnant. What are the risks? Generally, this is a safe procedure. However, problems may occur, including: Cyst recurrence. Infection. Bleeding. Allergic reactions to medicines. What happens before the procedure? Ask your health care provider about: Changing or stopping your regular medicines. This is especially important if you are taking diabetes medicines or blood thinners. Taking medicines such as aspirin and ibuprofen. These medicines can thin your blood. Do not take these medicines unless your health care provider tells you to take them. Taking over-the-counter medicines, vitamins, herbs, and supplements. Ask your heath care provider what steps will be taken to help prevent infection. This may include washing your skin with a germ-killing soap. What happens during the procedure? The skin around the cyst will be injected with a numbing medicine (local  anesthetic). An incision will be made over the cyst, and the wall of the cyst will be opened. A spreading instrument will be used to open up the cyst. The contents of the cyst will be removed with suction or irrigation. A thin strip of gauze packing may be placed in the cyst to keep it open and draining. The incision will be left open, and the cyst will be covered with a bandage (dressing). The procedure may vary among health care providers and hospitals. What can I expect after the procedure? After the procedure, it is common to have: Soreness. Blood-tinged fluid draining from the cyst. This drainage may stain your dressing. Follow these instructions at home: Medicines Take over-the-counter and prescription medicines only as told by your health care provider. If you were prescribed an antibiotic medicine, take it as told by your health care provider. Do not stop taking the antibiotic even if you start to feel better. Incision care  Follow instructions from your health care provider about how to take care of your incision. Make sure you: Wash your hands with soap and water for at least 20 seconds before and after you change your dressing. If soap and water are not available, use hand sanitizer. Change your dressing as told by your health care provider. Do not remove the packing. Do not try to put it back in if it falls out. You may need to return to your health care provider in a few days to have the packing removed. After your packing is removed, follow instructions from your health care provider about how to keep your incision area clean. Check your incision area every day for signs of infection. Check for: Redness, swelling, or pain. More fluid or blood.  Warmth. Pus or a bad smell. General instructions Return to your normal activities as told by your health care provider. Ask your health care provider what activities are safe for you. Do not take baths, swim, or use a hot tub until  your health care provider approves. Ask your health care provider if you may take showers. You may need to return to your health care provider to have the cyst removed after you heal from the drainage procedure. Keep all follow-up visits. This is important. Contact a health care provider if: You have chills or a fever. Blood soaks through your dressing. You have any signs of infection, especially spreading redness or increased pus coming from the cyst. Summary Epidermoid cyst drainage is a procedure to drain a fluid-filled sac that forms underneath your skin. If an epidermoid cyst gets inflamed, you may need to have a procedure to drain the cyst and relieve the discomfort caused by an inflamed cyst. During epidermoid cyst drainage, you will get local anesthesia so the cyst can be opened and the contents removed. You may have packing gauze put in the cyst to keep it open and draining. If you were prescribed an antibiotic medicine, take it as told by your health care provider. Do not stop taking the antibiotic even if you start to feel better. Epidermoid cysts often come back after drainage. You may need to have the cyst removed after you heal from the drainage procedure. This information is not intended to replace advice given to you by your health care provider. Make sure you discuss any questions you have with your health care provider. Document Revised: 07/24/2019 Document Reviewed: 07/24/2019 Elsevier Patient Education  [place].

## 2021-01-25 NOTE — Progress Notes (Addendum)
FOLLOW UP  Assessment and Plan:   Hypertension Well controlled with current medications  Monitor blood pressure at home; patient to call if consistently greater than 130/80 Continue DASH diet.   Reminder to go to the ER if any CP, SOB, nausea, dizziness, severe HA, changes vision/speech, left arm numbness and tingling and jaw pain.  Cholesterol Currently at goal;  Continue low cholesterol diet and exercise.  Check lipid panel.   Diabetes  with hyperlipidemia Continue medication: Continue diet and exercise.  Perform daily foot/skin check, notify office of any concerning changes.  Check A1C  Obesity with co morbidities Long discussion about weight loss, diet, and exercise Recommended diet heavy in fruits and veggies and low in animal meats, cheeses, and dairy products, appropriate calorie intake  Infected sebaceous Cyst I&D performed Instructed to keep area clean and dry Keflex $RemoveBe'500mg'QRwtAhfxc$  BID x 10   Dilated pore of winer Will make appt to have area removed   CKD Stage 2 due to diabetes mellitus Push fluids, control blood sugars, diet and exercise Microalbumin/creatinine urine ratio   Continue diet and meds as discussed. Further disposition pending results of labs. Discussed med's effects and SE's.   Over 30 minutes of exam, counseling, chart review, and critical decision making was performed.   Future Appointments  Date Time Provider [place]  05/06/2021  3:00 PM Liane Comber, NP [facility] None    ----------------------------------------------------------------------------------------------------------------------  HPI 60 y.o. male  presents for 3 month follow up on hypertension, cholesterol, diabetes, weight and vitamin D deficiency.   Pt noted 4 days ago a tender solid area under right arm, no drainage, fevers, myalgias. Pain when touched.  BMI is Body mass index is 39.31 kg/m., he has not been working on diet and exercise. Wt Readings from Last 3  Encounters:  01/25/21 (!) 306 lb 3.2 oz (138.9 kg)  06/26/20 289 lb (131.1 kg)  06/18/20 289 lb (131.1 kg)    His blood pressure has been controlled at home, today their BP is BP: 100/70 BP Readings from Last 3 Encounters:  01/25/21 100/70  06/26/20 (!) 154/89  04/30/20 (!) 148/92     He does workout. He denies chest pain, shortness of breath, dizziness.   He is not on cholesterol medication His cholesterol is at goal. The cholesterol last visit was:   Lab Results  Component Value Date   CHOL 153 04/30/2020   HDL 46 04/30/2020   LDLCALC 76 04/30/2020   TRIG 213 (H) 04/30/2020   CHOLHDL 3.3 04/30/2020    He has not been working on diet and exercise for prediabetes, and denies hyperglycemia, increased appetite, nausea, paresthesia of the feet, and polyuria. Last A1C in the office was:  Lab Results  Component Value Date   HGBA1C 8.8 (H) 04/30/2020   Patient is on Vitamin D supplement.   Lab Results  Component Value Date   VD25OH 42 04/30/2020        Current Medications:  Current Outpatient Medications on File Prior to Visit  Medication Sig   aspirin EC 81 MG tablet Take 81 mg by mouth daily.   Cholecalciferol (VITAMIN D-3) 125 MCG (5000 UT) TABS Take 15,000 Units by mouth daily. (Patient taking differently: Take by mouth 2 (two) times daily.)   CINNAMON PO Take 1,000 mg by mouth. Takes 2 tablets twice a day.   glimepiride (AMARYL) 4 MG tablet Take 1 tablet 2 x /day with Meals for Diabetes   glucose blood (FREESTYLE LITE) test strip CHECK BLOOD SUGAR 1  TIME A DAY-dx-E11.29.   glucose monitoring kit (FREESTYLE) monitoring kit Check blood sugar 1 time a day-DX-E11.29   insulin glargine (LANTUS SOLOSTAR) 100 UNIT/ML Solostar Pen INJECT 30 TO 40 UNITS DAILY FOR DIABETES   Lancets (FREESTYLE) lancets CHECK BLOOD SUGAR 1 TIME A DAY-DX-E11.29.   lisinopril (PRINIVIL,ZESTRIL) 20 MG tablet Take 1 tablet (20 mg total) by mouth daily.   metFORMIN (GLUCOPHAGE-XR) 500 MG 24 hr tablet  TAKE 2 TABLETS TWICE A DAY WITH MEALS FOR DIABETES   OZEMPIC, 1 MG/DOSE, 4 MG/3ML SOPN INJECT 1 MG UNDER THE SKIN ONCE WEEKLY   phentermine (ADIPEX-P) 37.5 MG tablet Take 1/2 to 1 tablet every morning for dieting & weight  loss   sildenafil (VIAGRA) 100 MG tablet Take  1/2 to 1 tablet  Daily  if needed for XXXX   Testosterone 20.25 MG/ACT (1.62%) GEL Apply 4 pumps Daily   No current facility-administered medications on file prior to visit.     Allergies: No Known Allergies   Medical History:  Past Medical History:  Diagnosis Date   Chronic kidney disease    Erectile dysfunction 09/11/2014   History of kidney stones    Hypertension    Hypogonadism male    Left ureteral stone    OSA on CPAP    Right bundle branch block    Sleep apnea    cpap   Type 2 diabetes mellitus ([place])    Family history- Reviewed and unchanged Social history- Reviewed and unchanged   Review of Systems:  Review of Systems  Constitutional:  Negative for chills, fever and weight loss.  HENT:  Negative for congestion and hearing loss.   Eyes:  Negative for blurred vision and double vision.  Respiratory:  Negative for cough and shortness of breath.   Cardiovascular:  Negative for chest pain, palpitations, orthopnea and leg swelling.  Gastrointestinal:  Negative for abdominal pain, constipation, diarrhea, heartburn, nausea and vomiting.  Genitourinary:  Negative for dysuria.  Musculoskeletal:  Negative for falls, joint pain and myalgias.  Skin:  Negative for rash.       Tender mass under right arm x 4 days, blackhead at base of back right side  Neurological:  Negative for dizziness, tingling, tremors, loss of consciousness and headaches.  Endo/Heme/Allergies:  Does not bruise/bleed easily.  Psychiatric/Behavioral:  Negative for depression, memory loss and suicidal ideas.      Physical Exam: BP 100/70   Pulse 82   Temp 97.9 F (36.6 C)   Wt (!) 306 lb 3.2 oz (138.9 kg)   SpO2 98%   BMI 39.31 kg/m   Wt Readings from Last 3 Encounters:  01/25/21 (!) 306 lb 3.2 oz (138.9 kg)  06/26/20 289 lb (131.1 kg)  06/18/20 289 lb (131.1 kg)   General Appearance: Well nourished, in no apparent distress. Eyes: PERRLA, EOMs, conjunctiva no swelling or erythema Sinuses: No Frontal/maxillary tenderness ENT/Mouth: Ext aud canals clear, TMs without erythema, bulging. No erythema, swelling, or exudate on post pharynx.  Tonsils not swollen or erythematous. Hearing normal.  Neck: Supple, thyroid normal.  Respiratory: Respiratory effort normal, BS equal bilaterally without rales, rhonchi, wheezing or stridor.  Cardio: RRR with no MRGs. Brisk peripheral pulses without edema.  Abdomen: Soft, + BS.  Non tender, no guarding, rebound, hernias, masses. Lymphatics: Non tender without lymphadenopathy.  Musculoskeletal: Full ROM, 5/5 strength, Normal gait Skin: Warm, dry without rashes. 2-3 cm firm slightly warm mass on right axilla. Large pore of winer on right top buttock, no signs of  infection Neuro: Cranial nerves intact. No cerebellar symptoms.  Psych: Awake and oriented X 3, normal affect, Insight and Judgment appropriate.   Diagnosis: epidermal inclusion cyst - Location: right axilla Procedure: Incision & drainage Informed consent:  Discussed risks ( infection, pain, bleeding, bruising, numbness, and recurrence of the condition) and benefits of the procedure, as well as the alternatives.  Informed consent was obtained. Anesthesia: 1cc 1% lidocaine The area was prepared and draped in a standard fashion. The lesion drained white, chalky, cyst material. The patient tolerated the procedure well. The patient was instructed on post-op care.   Magda Bernheim, NP 4:54 PM [facility]

## 2021-01-26 ENCOUNTER — Other Ambulatory Visit: Payer: Self-pay | Admitting: Internal Medicine

## 2021-01-26 LAB — CBC WITH DIFFERENTIAL/PLATELET
Absolute Monocytes: 314 cells/uL (ref 200–950)
Basophils Absolute: 39 cells/uL (ref 0–200)
Basophils Relative: 0.7 %
Eosinophils Absolute: 230 cells/uL (ref 15–500)
Eosinophils Relative: 4.1 %
HCT: 46.2 % (ref 38.5–50.0)
Hemoglobin: 15.5 g/dL (ref 13.2–17.1)
Lymphs Abs: 2414 cells/uL (ref 850–3900)
MCH: 28.6 pg (ref 27.0–33.0)
MCHC: 33.5 g/dL (ref 32.0–36.0)
MCV: 85.2 fL (ref 80.0–100.0)
MPV: 11.8 fL (ref 7.5–12.5)
Monocytes Relative: 5.6 %
Neutro Abs: 2604 cells/uL (ref 1500–7800)
Neutrophils Relative %: 46.5 %
Platelets: 210 10*3/uL (ref 140–400)
RBC: 5.42 10*6/uL (ref 4.20–5.80)
RDW: 12.4 % (ref 11.0–15.0)
Total Lymphocyte: 43.1 %
WBC: 5.6 10*3/uL (ref 3.8–10.8)

## 2021-01-26 LAB — COMPLETE METABOLIC PANEL WITH GFR
AG Ratio: 1.7 (calc) (ref 1.0–2.5)
ALT: 23 U/L (ref 9–46)
AST: 12 U/L (ref 10–35)
Albumin: 4.2 g/dL (ref 3.6–5.1)
Alkaline phosphatase (APISO): 107 U/L (ref 35–144)
BUN/Creatinine Ratio: 11 (calc) (ref 6–22)
BUN: 17 mg/dL (ref 7–25)
CO2: 27 mmol/L (ref 20–32)
Calcium: 9.7 mg/dL (ref 8.6–10.3)
Chloride: 105 mmol/L (ref 98–110)
Creat: 1.5 mg/dL — ABNORMAL HIGH (ref 0.70–1.30)
Globulin: 2.5 g/dL (calc) (ref 1.9–3.7)
Glucose, Bld: 178 mg/dL — ABNORMAL HIGH (ref 65–99)
Potassium: 4 mmol/L (ref 3.5–5.3)
Sodium: 141 mmol/L (ref 135–146)
Total Bilirubin: 1 mg/dL (ref 0.2–1.2)
Total Protein: 6.7 g/dL (ref 6.1–8.1)
eGFR: 54 mL/min/{1.73_m2} — ABNORMAL LOW (ref >=60)

## 2021-01-26 LAB — LIPID PANEL
Cholesterol: 137 mg/dL (ref <200)
HDL: 51 mg/dL (ref >=40)
LDL Cholesterol (Calc): 66 mg/dL (calc)
Non-HDL Cholesterol (Calc): 86 mg/dL (calc) (ref <130)
Total CHOL/HDL Ratio: 2.7 (calc) (ref <5.0)
Triglycerides: 117 mg/dL (ref <150)

## 2021-01-26 LAB — MICROALBUMIN / CREATININE URINE RATIO
Creatinine, Urine: 156 mg/dL (ref 20–320)
Microalb Creat Ratio: 477 mcg/mg creat — ABNORMAL HIGH (ref <30)
Microalb, Ur: 74.4 mg/dL

## 2021-01-26 LAB — HEMOGLOBIN A1C
Hgb A1c MFr Bld: 9.5 % of total Hgb — ABNORMAL HIGH (ref <5.7)
Mean Plasma Glucose: 226 mg/dL
eAG (mmol/L): 12.5 mmol/L

## 2021-01-27 ENCOUNTER — Other Ambulatory Visit: Payer: Self-pay | Admitting: Internal Medicine

## 2021-02-17 ENCOUNTER — Other Ambulatory Visit: Payer: Self-pay | Admitting: Internal Medicine

## 2021-04-08 LAB — HM DIABETES EYE EXAM

## 2021-04-20 ENCOUNTER — Encounter: Payer: Self-pay | Admitting: Internal Medicine

## 2021-04-22 ENCOUNTER — Other Ambulatory Visit: Payer: Self-pay | Admitting: Internal Medicine

## 2021-05-03 ENCOUNTER — Encounter: Admitting: Adult Health Nurse Practitioner

## 2021-05-06 ENCOUNTER — Encounter: Payer: Self-pay | Admitting: Adult Health

## 2021-05-06 ENCOUNTER — Ambulatory Visit (INDEPENDENT_AMBULATORY_CARE_PROVIDER_SITE_OTHER): Admitting: Adult Health

## 2021-05-06 ENCOUNTER — Other Ambulatory Visit: Payer: Self-pay

## 2021-05-06 VITALS — BP 150/80 | HR 75 | Temp 97.3°F | Ht 72.25 in | Wt 302.2 lb

## 2021-05-06 DIAGNOSIS — Z5320 Procedure and treatment not carried out because of patient's decision for unspecified reasons: Secondary | ICD-10-CM

## 2021-05-06 DIAGNOSIS — E1169 Type 2 diabetes mellitus with other specified complication: Secondary | ICD-10-CM

## 2021-05-06 DIAGNOSIS — E1122 Type 2 diabetes mellitus with diabetic chronic kidney disease: Secondary | ICD-10-CM

## 2021-05-06 DIAGNOSIS — E1129 Type 2 diabetes mellitus with other diabetic kidney complication: Secondary | ICD-10-CM

## 2021-05-06 DIAGNOSIS — Z1329 Encounter for screening for other suspected endocrine disorder: Secondary | ICD-10-CM

## 2021-05-06 DIAGNOSIS — Z23 Encounter for immunization: Secondary | ICD-10-CM

## 2021-05-06 DIAGNOSIS — Z136 Encounter for screening for cardiovascular disorders: Secondary | ICD-10-CM | POA: Diagnosis not present

## 2021-05-06 DIAGNOSIS — Z131 Encounter for screening for diabetes mellitus: Secondary | ICD-10-CM | POA: Diagnosis not present

## 2021-05-06 DIAGNOSIS — Z130 Encounter for screening for diseases of the blood and blood-forming organs and certain disorders involving the immune mechanism: Secondary | ICD-10-CM

## 2021-05-06 DIAGNOSIS — G4733 Obstructive sleep apnea (adult) (pediatric): Secondary | ICD-10-CM

## 2021-05-06 DIAGNOSIS — Z0001 Encounter for general adult medical examination with abnormal findings: Secondary | ICD-10-CM

## 2021-05-06 DIAGNOSIS — E785 Hyperlipidemia, unspecified: Secondary | ICD-10-CM

## 2021-05-06 DIAGNOSIS — I10 Essential (primary) hypertension: Secondary | ICD-10-CM | POA: Diagnosis not present

## 2021-05-06 DIAGNOSIS — E559 Vitamin D deficiency, unspecified: Secondary | ICD-10-CM

## 2021-05-06 DIAGNOSIS — C61 Malignant neoplasm of prostate: Secondary | ICD-10-CM | POA: Insufficient documentation

## 2021-05-06 DIAGNOSIS — Z0000 Encounter for general adult medical examination without abnormal findings: Secondary | ICD-10-CM

## 2021-05-06 DIAGNOSIS — Z87442 Personal history of urinary calculi: Secondary | ICD-10-CM

## 2021-05-06 DIAGNOSIS — Z1389 Encounter for screening for other disorder: Secondary | ICD-10-CM | POA: Diagnosis not present

## 2021-05-06 DIAGNOSIS — E11319 Type 2 diabetes mellitus with unspecified diabetic retinopathy without macular edema: Secondary | ICD-10-CM

## 2021-05-06 DIAGNOSIS — Z79899 Other long term (current) drug therapy: Secondary | ICD-10-CM | POA: Diagnosis not present

## 2021-05-06 DIAGNOSIS — I4510 Unspecified right bundle-branch block: Secondary | ICD-10-CM

## 2021-05-06 DIAGNOSIS — Z13220 Encounter for screening for lipoid disorders: Secondary | ICD-10-CM

## 2021-05-06 DIAGNOSIS — N183 Chronic kidney disease, stage 3 unspecified: Secondary | ICD-10-CM

## 2021-05-06 DIAGNOSIS — E349 Endocrine disorder, unspecified: Secondary | ICD-10-CM

## 2021-05-06 DIAGNOSIS — I440 Atrioventricular block, first degree: Secondary | ICD-10-CM | POA: Insufficient documentation

## 2021-05-06 DIAGNOSIS — R972 Elevated prostate specific antigen [PSA]: Secondary | ICD-10-CM | POA: Insufficient documentation

## 2021-05-06 NOTE — Patient Instructions (Addendum)
°  Carlos [name] , Thank you for taking time to come for your Annual Wellness Visit. I appreciate your ongoing commitment to your health goals. Please review the following plan we discussed and let me know if I can assist you in the future.   These are the goals we discussed:  Goals      Fasting Blood Glucose<130     HEMOGLOBIN A1C < 7.0     LDL CALC < 70     Weight (lb) < 280 lb (127 kg)        This is a list of the screening recommended for you and due dates:  Health Maintenance  Topic Date Due   Zoster (Shingles) Vaccine (1 of 2) Never done   Pneumococcal Vaccination (2 - PCV) 04/02/2013   Complete foot exam   06/06/2019   Flu Shot  11/30/2020   COVID-19 Vaccine (4 - Booster for Pfizer series) 06/22/2021   Hemoglobin A1C  07/25/2021   Tetanus Vaccine  10/13/2021   Eye exam for diabetics  04/08/2022   Colon Cancer Screening  06/27/2023   Hepatitis C Screening: USPSTF Recommendation to screen - Ages 18-79 yo.  Completed   HIV Screening  Completed   HPV Vaccine  Aged Out     Know what a healthy weight is for you (roughly BMI <25) and aim to maintain this  Aim for 7+ servings of fruits and vegetables daily  65-80+ fluid ounces of water or unsweet tea for healthy kidneys  Limit to max 1 drink of alcohol per day; avoid smoking/tobacco  Limit animal fats in diet for cholesterol and heart health - choose grass fed whenever available  Avoid highly processed foods, and foods high in saturated/trans fats  Aim for low stress - take time to unwind and care for your mental health  Aim for 150 min of moderate intensity exercise weekly for heart health, and weights twice weekly for bone health  Aim for 7-9 hours of sleep daily      Bad carbs also include fruit juice, alcohol, and sweet tea. These are empty calories that do not signal to your brain that you are full.  Please remember the good carbs are still carbs which convert into sugar. So please measure them out no more than  1/2-1 cup of rice, oatmeal, pasta, and beans Veggies are however free foods! Pile them on.  Not all fruit is created equal. Please see the list below, the fruit at the bottom is higher in sugars than the fruit at the top. Please avoid all dried fruits.

## 2021-05-06 NOTE — Progress Notes (Signed)
COMPLETE PHYSICAL   Assessment and Plan:  Encounter for Annual Physical Exam with abnormal findings Due annually  Health Maintenance reviewed Healthy lifestyle reviewed and goals set  Type 2 diabetes mellitus with diabetic nephropathy ([place]) POORLY CONTROLLED -  He is on metformin 2000 mg, glimepiride 4 mg BID, ozempic 1 mg, glargine 40 units. He admits to poor diet/exercise. We dicussed risks of uncontrolled diabetes including cardiovascular disease, vision, kidney, neuropathy and death.  He declines med changes at this time, states would like to work on diet/lifestyle. Encouraged food/glucose log with close follow up. He is able to verbalize low carb diet, fasting glucose <130 goal   Education: Reviewed ABCs of diabetes management (respective goals in parentheses):  A1C (<7), blood pressure (<130/80), and cholesterol (LDL <70) Eye Exam yearly and Dental Exam every 6 months. Dietary recommendations Physical Activity recommendations -     Hemoglobin A1c ([facility])  Hyperlipidemia associated with type 2 diabetes mellitus (HCC) LDL <70, discussed CVD risks with T2DM, benefit of statins, will consider but declines at this time.  decrease fatty foods increase activity.  -     Lipid Profile  CKD stage 3 due to type 2 diabetes mellitus (HCC)/ Microalbuminuria (HCC) Emphasized better glucose control, increase water, control BP Continue lisinopril, titate as able Consider amlodipine, SGLT2i, kerendia - declined med changes at this visit   Erectile dysfunction associated with type 2 diabetes mellitus ([place]) Need to get better control of sugars  Retinopathy, diabetic ([place]) Continue ophth, emphasized need for improved glucose control   Morbid obesity ([place]) - BMI 40 with OSA - follow up 3 months for progress monitoring - increase veggies, decrease carbs - long discussion about weight loss, diet, and exercise - phentermine does help, plans to restart, denies SE  Hereditary  hemochromatosis (HCC) Monitor labs, check CBC/iron/ferritin  Essential hypertension - continue medications, atypically elevated today but last very well controlled, start checking at home and send values in a few weeks - DASH diet, exercise and monitor at home. Call if greater than 130/80.  -     CBC with Diff -     COMPLETE METABOLIC PANEL WITH GFR -     TSH -     Urinalysis, Routine w reflex microscopic -     Microalbumin / Creatinine Urine Ratio -     EKG 12-Lead  OSA on CPAP Sleep apnea- continue CPAP, CPAP is helping with daytime fatigue, weight loss still advised.   Medication management -     Magnesium  Vitamin D deficiency -     Vitamin D (25 hydroxy)  Testosterone deficiency -     Testosterone, Total  History of nephrolithiasis Increase water intake, urology follows  Screening PSA (prostate specific antigen) -     PSA  Screening, ischemic heart disease RBBB, AV block 1 -     EKG 12-Lead  Screening for blood or protein in urine -     Urinalysis w microscopic + reflex cultur -     Microalbumin   Need for influenza vaccine Quadrivalent flu vaccine administered without complication today     Orders Placed This Encounter  Procedures   CBC with Differential/Platelet   COMPLETE METABOLIC PANEL WITH GFR   Magnesium   Lipid panel   TSH   Hemoglobin A1c   VITAMIN D 25 Hydroxy (Vit-D Deficiency, Fractures)   Microalbumin / creatinine urine ratio   Urinalysis, Routine w reflex microscopic   Testosterone   Iron, TIBC and Ferritin Panel   EKG 12-Lead  HM DIABETES FOOT EXAM     Discussed med's effects and SE's. Screening labs and tests as requested with regular follow-up as recommended.  Future Appointments  Date Time Provider [place]  08/25/2021  4:00 PM Liane Comber, NP [facility] None  05/10/2022  3:00 PM Liane Comber, NP [facility] None      HPI 61 y.o. AA male  presents for a complete physical. He has Essential hypertension;  T2_NIDDM with CKD III ([place]); Hyperlipidemia associated with type 2 diabetes mellitus ([place]); Testosterone deficiency; Vitamin D deficiency; Medication management; OSA on CPAP; History of nephrolithiasis; Morbid obesity ([place]); Hemochromatosis; Erectile dysfunction associated with type 2 diabetes mellitus ([place]); FH: hypertension; CKD stage 3 due to type 2 diabetes mellitus ([place]); Elevated PSA; Diabetic retinopathy associated with type 2 diabetes mellitus ([place]); RBBB; and AV block, 1st degree on their problem list.  He is married, had 3 children, 1 passed with GYN cancer in 2021. Has 2 grandchildren. He works as Agricultural consultant.   He has OSA on CPAP, reports 90% compliance with restorative sleep.   BMI is Body mass index is 40.7 kg/m., he has been working on diet and exercise, active with yard and chopping wood. Rides a road bike twice a week. No caffeine. Drinks water/unsweet tea. Admits eats more during winter, phentermine does help with appetite, hasn't been  Wt Readings from Last 3 Encounters:  05/06/21 (!) 302 lb 3.2 oz (137.1 kg)  01/25/21 (!) 306 lb 3.2 oz (138.9 kg)  06/26/20 289 lb (131.1 kg)   His blood pressure is not checked at home, he is on lisinopril 20 mg, today their BP is BP: (!) 150/80, last check was 100/70 He does workout.  He denies chest pain, shortness of breath, dizziness.   He has not been working on diet and exercise for diabetes, long hx of poorly controlled A1C on review with patient today  with CKD III he is on lisinopril  Hyperlipidemia not statin, cholesterol IS at goal less than 70, declines  ED, has viagra He is on ASA MF 2000 mg a day total He is on lantus 40 units Ozempic 1 mg weekly He is on amaryl 4 mg 1 pill BID Eye exam: Dr. Sabra Heck 2022 with retinopathy Meter: Freestyle Reports fasting 160-185  denies paresthesia of the feet, polydipsia and polyuria.  Last A1C in the office was:  Lab Results  Component Value Date   HGBA1C 9.5 (H)  01/25/2021   Lab Results  Component Value Date   GFRAA 55 (L) 04/30/2020   Lab Results  Component Value Date   CHOL 137 01/25/2021   HDL 51 01/25/2021   LDLCALC 66 01/25/2021   TRIG 117 01/25/2021   CHOLHDL 2.7 01/25/2021   Patient is on Vitamin D supplement, taking 10000 IU daily Lab Results  Component Value Date   VD25OH 25 04/30/2020   He is has a history of testosterone deficiency and is on testosterone replacement at this time, only on 2 pumps each arm a day He states that the testosterone is help with his energy, libido, muscle mass.  Lab Results  Component Value Date   TESTOSTERONE 339 12/13/2019   Has history of kidney stone, recently elevated PSAs up to 11+, follows with Dr. Louis Meckel. Per his note in 11/2020 patient had MRI showing PI-RADS 2, monitoring.  Lab Results  Component Value Date   PSA 6.65 (H) 04/30/2020   PSA 4.1 (H) 05/01/2019   PSA 3.8 06/05/2018   Hx of hemochromatosis, currently monitoring  only, did see hematologist, switching off of testosterone injections helped.  Lab Results  Component Value Date   IRON 81 04/30/2020   TIBC 240 (L) 04/30/2020   FERRITIN 231 04/30/2020   CBC Latest Ref Rng & Units 01/25/2021 04/30/2020 12/13/2019  WBC 3.8 - 10.8 Thousand/uL 5.6 5.4 4.6  Hemoglobin 13.2 - 17.1 g/dL 15.5 16.3 15.8  Hematocrit 38.5 - 50.0 % 46.2 48.5 46.6  Platelets 140 - 400 Thousand/uL 210 236 189      Current Medications:   Current Outpatient Medications (Endocrine & Metabolic):    glimepiride (AMARYL) 4 MG tablet, Take 1 tablet 2 x /day with Meals for Diabetes   insulin glargine (LANTUS SOLOSTAR) 100 UNIT/ML Solostar Pen, INJECT 30 TO 40 UNITS DAILY FOR DIABETES   metFORMIN (GLUCOPHAGE-XR) 500 MG 24 hr tablet, TAKE 2 TABLETS TWICE A DAY WITH MEALS FOR DIABETES   OZEMPIC, 1 MG/DOSE, 4 MG/3ML SOPN, INJECT 1 MG UNDER THE SKIN ONCE WEEKLY   Testosterone 20.25 MG/ACT (1.62%) GEL, Apply 4 pumps Daily  Current Outpatient Medications  (Cardiovascular):    lisinopril (ZESTRIL) 20 MG tablet, TAKE ONE TABLET BY MOUTH ONE TIME DAILY   sildenafil (VIAGRA) 100 MG tablet, TAKE HALF TO ONE TABLET BY MOUTH DAILY IF NEEDED   Current Outpatient Medications (Analgesics):    aspirin EC 81 MG tablet, Take 81 mg by mouth daily.   Current Outpatient Medications (Other):    Cholecalciferol (VITAMIN D-3) 125 MCG (5000 UT) TABS, Take 15,000 Units by mouth daily. (Patient taking differently: Take by mouth 2 (two) times daily.)   CINNAMON PO, Take 1,000 mg by mouth. Takes 2 tablets twice a day.   glucose blood (FREESTYLE LITE) test strip, CHECK BLOOD SUGAR 1 TIME A DAY-dx-E11.29.   glucose monitoring kit (FREESTYLE) monitoring kit, Check blood sugar 1 time a day-DX-E11.29   Lancets (FREESTYLE) lancets, CHECK BLOOD SUGAR 1 TIME A DAY-DX-E11.29.   phentermine (ADIPEX-P) 37.5 MG tablet, Take 1/2 to 1 tablet every morning for dieting & weight  loss (Patient not taking: Reported on 05/06/2021)  Health Maintenance:  Immunization History  Administered Date(s) Administered   DTaP 04/02/2012   Influenza Inj Mdck Quad With Preservative 03/20/2018   Influenza Split 02/17/2014   Influenza-Unspecified 01/13/2017   PFIZER Comirnaty(Gray Top)Covid-19 Tri-Sucrose Vaccine 04/27/2021   PFIZER(Purple Top)SARS-COV-2 Vaccination 08/22/2019, 09/16/2019   PPD Test 06/05/2018   Pneumococcal Polysaccharide-23 04/02/2012   Tetanus: 2013 Pneumovax: 2013 Prevnar 13: due age 89 Flu vaccine: 2021, DUE Shingrix: check with insurance Covid 19: 2/2, booster  DEXA: N/A Colonoscopy: 06/26/2020 Dr Hilarie Fredrickson, 3 year recall  EGD: N/A  Eye exam 04/08/2021, Dr. Sabra Heck, retinopathy [facility]ist: [facility], 2022 Urologist: Dr. Louis Meckel, going q60mEndocrinologist: Dr. GCruzita Lederer has seen in the past, no longer seeing  Medical History:  Past Medical History:  Diagnosis Date   Chronic kidney disease    Erectile dysfunction 09/11/2014   History of kidney stones     Hypertension    Hypogonadism male    Left ureteral stone    OSA on CPAP    Right bundle branch block    Sleep apnea    cpap   Type 2 diabetes mellitus ([place]    Allergies No Known Allergies  SURGICAL HISTORY He  has a past surgical history that includes Ankle surgery (Left, 2002); Rhinoplasty (2001); Extracorporeal shock wave lithotripsy (Left, 09-25-2014); Cystoscopy/retrograde/ureteroscopy/stone extraction with basket (Left, 12/05/2014); Cystoscopy w/ retrogrades (Left, 12/05/2014); and Colonoscopy (2018).   FAMILY HISTORY His family history includes Breast cancer (age  of onset: 76) in his mother; Diabetes in his father; Heart attack in his paternal grandfather; Hypertension in his mother; Uterine cancer (age of onset: 52) in his daughter.   SOCIAL HISTORY He  reports that he has never smoked. He has never used smokeless tobacco. He reports current alcohol use. He reports that he does not use drugs.   Review of Systems  Constitutional:  Negative for malaise/fatigue and weight loss.  HENT:  Negative for hearing loss and tinnitus.   Eyes:  Negative for blurred vision and double vision.  Respiratory:  Negative for cough, sputum production, shortness of breath and wheezing.   Cardiovascular:  Negative for chest pain, palpitations, orthopnea, claudication, leg swelling and PND.  Gastrointestinal:  Negative for abdominal pain, blood in stool, constipation, diarrhea, heartburn, melena, nausea and vomiting.  Genitourinary: Negative.   Musculoskeletal:  Negative for falls, joint pain and myalgias.  Skin:  Negative for rash.  Neurological:  Negative for dizziness, tingling, sensory change, weakness and headaches.  Endo/Heme/Allergies:  Negative for polydipsia.  Psychiatric/Behavioral: Negative.  Negative for depression, memory loss, substance abuse and suicidal ideas. The patient is not nervous/anxious and does not have insomnia.   All other systems reviewed and are negative.  Physical  Exam: Estimated body mass index is 40.7 kg/m as calculated from the following:   Height as of this encounter: 6' 0.25" (1.835 m).   Weight as of this encounter: 302 lb 3.2 oz (137.1 kg). BP (!) 150/80    Pulse 75    Temp (!) 97.3 F (36.3 C)    Ht 6' 0.25" (1.835 m)    Wt (!) 302 lb 3.2 oz (137.1 kg)    SpO2 99%    BMI 40.70 kg/m  General Appearance: Well nourished, in no apparent distress. Eyes: PERRLA, EOMs, conjunctiva no swelling or erythema Sinuses: No Frontal/maxillary tenderness ENT/Mouth: Ext aud canals clear after removal in the office, normal light reflex with TMs without erythema after bilateral ears were cleaned in the office, bulging. Good dentition. No erythema, swelling, or exudate on post pharynx. Tonsils not swollen or erythematous. Hearing normal.  Neck: Supple, thyroid normal. No bruits Respiratory: Respiratory effort normal, BS equal bilaterally without rales, rhonchi, wheezing or stridor. Cardio: RRR without murmurs, rubs or gallops. Brisk peripheral pulses without edema.  Chest: symmetric, with normal excursions and percussion. Abdomen: Soft, +BS. Non tender, no guarding, rebound, hernias, masses, or organomegaly. .  Lymphatics: Non tender without lymphadenopathy.  Genitourinary: defer urologist Musculoskeletal: Full ROM all peripheral extremities,5/5 strength, and normal gait.Marland Kitchen  Neuro: Cranial nerves intact, reflexes equal bilaterally. Normal muscle tone, no cerebellar symptoms. Sensation intact to monofilament.   Psych: Awake and oriented X 3, normal affect, Insight and Judgment appropriate.  Skin: dark nevus left medial foot, unchanged, approx 8 mm Warm, dry without rashes, ecchymosis  EKG: RBBB, PAC/PVC, AVB1 no ST changes  Izora Ribas, NP 5:13 PM [facility]

## 2021-05-07 DIAGNOSIS — E1129 Type 2 diabetes mellitus with other diabetic kidney complication: Secondary | ICD-10-CM | POA: Insufficient documentation

## 2021-05-07 LAB — TSH: TSH: 1.45 mIU/L (ref 0.40–4.50)

## 2021-05-07 LAB — URINALYSIS, ROUTINE W REFLEX MICROSCOPIC
Bacteria, UA: NONE SEEN /HPF
Bilirubin Urine: NEGATIVE
Hgb urine dipstick: NEGATIVE
Hyaline Cast: NONE SEEN /LPF
Ketones, ur: NEGATIVE
Leukocytes,Ua: NEGATIVE
Nitrite: NEGATIVE
RBC / HPF: NONE SEEN /HPF (ref 0–2)
Specific Gravity, Urine: 1.02 (ref 1.001–1.035)
Squamous Epithelial / HPF: NONE SEEN /HPF (ref <=5)
WBC, UA: NONE SEEN /HPF (ref 0–5)
pH: 5.5 (ref 5.0–8.0)

## 2021-05-07 LAB — CBC WITH DIFFERENTIAL/PLATELET
Absolute Monocytes: 319 cells/uL (ref 200–950)
Basophils Absolute: 40 cells/uL (ref 0–200)
Basophils Relative: 0.7 %
Eosinophils Absolute: 211 cells/uL (ref 15–500)
Eosinophils Relative: 3.7 %
HCT: 46.8 % (ref 38.5–50.0)
Hemoglobin: 16 g/dL (ref 13.2–17.1)
Lymphs Abs: 2685 cells/uL (ref 850–3900)
MCH: 29.1 pg (ref 27.0–33.0)
MCHC: 34.2 g/dL (ref 32.0–36.0)
MCV: 85.1 fL (ref 80.0–100.0)
MPV: 11.3 fL (ref 7.5–12.5)
Monocytes Relative: 5.6 %
Neutro Abs: 2445 cells/uL (ref 1500–7800)
Neutrophils Relative %: 42.9 %
Platelets: 233 10*3/uL (ref 140–400)
RBC: 5.5 10*6/uL (ref 4.20–5.80)
RDW: 12.1 % (ref 11.0–15.0)
Total Lymphocyte: 47.1 %
WBC: 5.7 10*3/uL (ref 3.8–10.8)

## 2021-05-07 LAB — COMPLETE METABOLIC PANEL WITH GFR
AG Ratio: 1.6 (calc) (ref 1.0–2.5)
ALT: 26 U/L (ref 9–46)
AST: 18 U/L (ref 10–35)
Albumin: 4.1 g/dL (ref 3.6–5.1)
Alkaline phosphatase (APISO): 110 U/L (ref 35–144)
BUN/Creatinine Ratio: 14 (calc) (ref 6–22)
BUN: 22 mg/dL (ref 7–25)
CO2: 31 mmol/L (ref 20–32)
Calcium: 10 mg/dL (ref 8.6–10.3)
Chloride: 107 mmol/L (ref 98–110)
Creat: 1.58 mg/dL — ABNORMAL HIGH (ref 0.70–1.30)
Globulin: 2.6 g/dL (calc) (ref 1.9–3.7)
Glucose, Bld: 104 mg/dL — ABNORMAL HIGH (ref 65–99)
Potassium: 4 mmol/L (ref 3.5–5.3)
Sodium: 143 mmol/L (ref 135–146)
Total Bilirubin: 1 mg/dL (ref 0.2–1.2)
Total Protein: 6.7 g/dL (ref 6.1–8.1)
eGFR: 50 mL/min/{1.73_m2} — ABNORMAL LOW (ref >=60)

## 2021-05-07 LAB — HEMOGLOBIN A1C
Hgb A1c MFr Bld: 9 % of total Hgb — ABNORMAL HIGH (ref <5.7)
Mean Plasma Glucose: 212 mg/dL
eAG (mmol/L): 11.7 mmol/L

## 2021-05-07 LAB — IRON,TIBC AND FERRITIN PANEL
%SAT: 46 % (calc) (ref 20–48)
Ferritin: 222 ng/mL (ref 38–380)
Iron: 105 ug/dL (ref 50–180)
TIBC: 229 mcg/dL (calc) — ABNORMAL LOW (ref 250–425)

## 2021-05-07 LAB — MAGNESIUM: Magnesium: 1.9 mg/dL (ref 1.5–2.5)

## 2021-05-07 LAB — LIPID PANEL
Cholesterol: 143 mg/dL (ref <200)
HDL: 50 mg/dL (ref >=40)
LDL Cholesterol (Calc): 72 mg/dL (calc)
Non-HDL Cholesterol (Calc): 93 mg/dL (calc) (ref <130)
Total CHOL/HDL Ratio: 2.9 (calc) (ref <5.0)
Triglycerides: 120 mg/dL (ref <150)

## 2021-05-07 LAB — TESTOSTERONE: Testosterone: 327 ng/dL (ref 250–827)

## 2021-05-07 LAB — MICROSCOPIC MESSAGE

## 2021-05-07 LAB — MICROALBUMIN / CREATININE URINE RATIO
Creatinine, Urine: 122 mg/dL (ref 20–320)
Microalb Creat Ratio: 400 mcg/mg creat — ABNORMAL HIGH (ref <30)
Microalb, Ur: 48.8 mg/dL

## 2021-05-07 LAB — VITAMIN D 25 HYDROXY (VIT D DEFICIENCY, FRACTURES): Vit D, 25-Hydroxy: 32 ng/mL (ref 30–100)

## 2021-06-02 DIAGNOSIS — C61 Malignant neoplasm of prostate: Secondary | ICD-10-CM

## 2021-06-02 HISTORY — DX: Malignant neoplasm of prostate: C61

## 2021-06-10 ENCOUNTER — Other Ambulatory Visit: Payer: Self-pay | Admitting: Adult Health Nurse Practitioner

## 2021-06-10 ENCOUNTER — Other Ambulatory Visit: Payer: Self-pay | Admitting: Adult Health

## 2021-06-14 ENCOUNTER — Other Ambulatory Visit: Payer: Self-pay

## 2021-06-14 MED ORDER — LISINOPRIL 20 MG PO TABS: 20.0000 mg | ORAL_TABLET | Freq: Every day | ORAL | 0 refills | Status: DC

## 2021-06-24 ENCOUNTER — Other Ambulatory Visit: Payer: Self-pay

## 2021-06-24 DIAGNOSIS — E1122 Type 2 diabetes mellitus with diabetic chronic kidney disease: Secondary | ICD-10-CM

## 2021-06-24 DIAGNOSIS — N181 Chronic kidney disease, stage 1: Secondary | ICD-10-CM

## 2021-06-24 MED ORDER — FREESTYLE LANCETS MISC: 12 refills | Status: DC

## 2021-06-28 ENCOUNTER — Encounter: Payer: Self-pay | Admitting: Nurse Practitioner

## 2021-06-28 ENCOUNTER — Ambulatory Visit (INDEPENDENT_AMBULATORY_CARE_PROVIDER_SITE_OTHER): Admitting: Nurse Practitioner

## 2021-06-28 ENCOUNTER — Other Ambulatory Visit: Payer: Self-pay

## 2021-06-28 VITALS — BP 140/82 | HR 94 | Temp 97.5°F | Wt 308.8 lb

## 2021-06-28 DIAGNOSIS — N182 Chronic kidney disease, stage 2 (mild): Secondary | ICD-10-CM

## 2021-06-28 DIAGNOSIS — E1122 Type 2 diabetes mellitus with diabetic chronic kidney disease: Secondary | ICD-10-CM | POA: Diagnosis not present

## 2021-06-28 DIAGNOSIS — D235 Other benign neoplasm of skin of trunk: Secondary | ICD-10-CM | POA: Diagnosis not present

## 2021-06-28 DIAGNOSIS — Z794 Long term (current) use of insulin: Secondary | ICD-10-CM

## 2021-06-28 MED ORDER — CEPHALEXIN 500 MG PO CAPS: 500.0000 mg | ORAL_CAPSULE | Freq: Two times a day (BID) | ORAL | 0 refills | Status: AC

## 2021-06-28 NOTE — Progress Notes (Signed)
Assessment and Plan:  Dayln was seen today for acute visit.  Diagnoses and all orders for this visit:  Dilated pore of Winer of back -     cephALEXin (KEFLEX) 500 MG capsule; Take 1 capsule (500 mg total) by mouth 2 (two) times daily for 10 days. -     Incise and drain dilated pore of winer- area packed with iodoform gauze.  Advised to removed dressing in 3 days. F/U in 2 weeks for reevaluation. If develop redness, heat at site or fever notify the office.   Type 2 diabetes mellitus with Stage 2 CKD Continue medication, monitor blood sugar Continue diet and exercise Push fluids and avoid NSAIDS  Further disposition pending results of labs. Discussed med's effects and SE's.   Over 30 minutes of exam, counseling, chart review, and critical decision making was performed.   Future Appointments  Date Time Provider [place]  08/25/2021  4:00 PM Liane Comber, NP [facility] None  05/10/2022  3:00 PM Liane Comber, NP [facility] None    ------------------------------------------------------------------------------------------------------------------   HPI BP 140/82    Pulse 94    Temp (!) 97.5 F (36.4 C)    Wt (!) 308 lb 12.8 oz (140.1 kg)    SpO2 98%    BMI 41.59 kg/m  61 y.o.male presents for Removal of dilated pore of Winer on right lower back/upper buttock.  Past Medical History:  Diagnosis Date   Chronic kidney disease    Erectile dysfunction 09/11/2014   History of kidney stones    Hypertension    Hypogonadism male    Left ureteral stone    OSA on CPAP    Right bundle branch block    Sleep apnea    cpap   Type 2 diabetes mellitus (HCC)      No Known Allergies  Current Outpatient Medications on File Prior to Visit  Medication Sig   aspirin EC 81 MG tablet Take 81 mg by mouth daily.   Cholecalciferol (VITAMIN D-3) 125 MCG (5000 UT) TABS Take 15,000 Units by mouth daily. (Patient taking differently: Take by mouth 2 (two) times daily.)   CINNAMON PO Take  1,000 mg by mouth. Takes 2 tablets twice a day.   glimepiride (AMARYL) 4 MG tablet Take 1 tablet 2 x /day with Meals for Diabetes   lisinopril (ZESTRIL) 20 MG tablet Take 1 tablet (20 mg total) by mouth daily.   metFORMIN (GLUCOPHAGE-XR) 500 MG 24 hr tablet TAKE 2 TABLETS TWICE A DAY WITH MEALS FOR DIABETES   OZEMPIC, 1 MG/DOSE, 4 MG/3ML SOPN INJECT 1 MG UNDER THE SKIN ONCE WEEKLY   sildenafil (VIAGRA) 100 MG tablet TAKE HALF TO ONE TABLET BY MOUTH DAILY IF NEEDED   Testosterone 20.25 MG/ACT (1.62%) GEL Apply 4 pumps Daily   glucose blood (FREESTYLE LITE) test strip CHECK BLOOD SUGAR 1 TIME A DAY-dx-E11.29.   glucose monitoring kit (FREESTYLE) monitoring kit Check blood sugar 1 time a day-DX-E11.29   Lancets (FREESTYLE) lancets CHECK BLOOD SUGAR 1 TIME A DAY-DX-E11.29.   LANTUS SOLOSTAR 100 UNIT/ML Solostar Pen INJECT 30 TO 40 UNITS DAILY FOR DIABETES   phentermine (ADIPEX-P) 37.5 MG tablet Take 1/2 to 1 tablet every morning for dieting & weight  loss (Patient not taking: Reported on 05/06/2021)   No current facility-administered medications on file prior to visit.    ROS: all negative except above.   Physical Exam:  BP 140/82    Pulse 94    Temp (!) 97.5 F (36.4 C)  Wt (!) 308 lb 12.8 oz (140.1 kg)    SpO2 98%    BMI 41.59 kg/m   General Appearance: Well nourished, in no apparent distress. Eyes: PERRLA, EOMs, conjunctiva no swelling or erythema Sinuses: No Frontal/maxillary tenderness ENT/Mouth: Ext aud canals clear, TMs without erythema, bulging. No erythema, swelling, or exudate on post pharynx.  Tonsils not swollen or erythematous. Hearing normal.  Neck: Supple, thyroid normal.  Respiratory: Respiratory effort normal, BS equal bilaterally without rales, rhonchi, wheezing or stridor.  Cardio: RRR with no MRGs. Brisk peripheral pulses without edema.  Abdomen: Soft, + BS.  Non tender, no guarding, rebound, hernias, masses. Lymphatics: Non tender without lymphadenopathy.   Musculoskeletal: Full ROM, 5/5 strength, normal gait.  Skin: Warm, dry . Dilated pore of winer at right upper buttock Neuro: Cranial nerves intact. Normal muscle tone, no cerebellar symptoms. Sensation intact.  Psych: Awake and oriented X 3, normal affect, Insight and Judgment appropriate.    Diagnosis: abscess- dilated pore of winer- Location: right upper buttock   Procedure: Incision & drainage Informed consent:  Discussed risks (infection, pain, bleeding, bruising, numbness, and recurrence of the condition) and benefits of the procedure, as well as the alternatives.  Informed consent was obtained. Anesthesia: 2 cc of 1% lidocaine  Type: total The area was prepared and draped in a standard fashion. The lesion drained Thick sebum/skin cells. Area was packed with iodoform gauze and tegaderm was applied.  The patient tolerated the procedure well. The patient was instructed on post-op care.   Magda Bernheim, NP 12:10 PM [facility]

## 2021-07-07 NOTE — Progress Notes (Signed)
Assessment and Plan: ? ?Carlos [name] was seen today for follow-up. ? ?Diagnoses and all orders for this visit: ? ?Dilated pore of Winer of back ?Encounter for recheck of abscess following incision and drainage ?Area healing well, no s/s of infection ?Continue to monitor, if develops redness or pain at site notify the office ?Keep area clean and dry ?  ?Essential Hypertension ?- continue medications, DASH diet, exercise and monitor at home. Call if greater than 130/80.   ? ? ?Further disposition pending results of labs. Discussed med's effects and SE's.   ?Over 30 minutes of exam, counseling, chart review, and critical decision making was performed.  ? ?Future Appointments  ?Date Time Provider [place]  ?08/25/2021  4:00 PM Liane Comber, NP [facility] None  ?05/10/2022  3:00 PM Liane Comber, NP [facility] None  ? ? ?------------------------------------------------------------------------------------------------------------------ ? ? ?HPI ?BP (!) 142/80   Pulse 98   Temp 97.7 ?F (36.5 ?C)   Wt (!) 304 lb 9.6 oz (138.2 kg)   SpO2 97%   BMI 41.03 kg/m?  ? ?61 y.o.male presents for reevaluation of dilated pore of winer that was I&D'd. Iodoform packing is still in place. Packing removed.  Area has no further drainage, healing well. Denies pain and drainage at site. ? ?Bp is currently controlled at home on Lisinopril 20 mg daily, running 130/80 or less ?BP Readings from Last 3 Encounters:  ?07/12/21 (!) 142/80  ?06/28/21 140/82  ?05/06/21 (!) 150/80  ?  ? ?Past Medical History:  ?Diagnosis Date  ? Chronic kidney disease   ? Erectile dysfunction 09/11/2014  ? History of kidney stones   ? Hypertension   ? Hypogonadism male   ? Left ureteral stone   ? OSA on CPAP   ? Right bundle branch block   ? Sleep apnea   ? cpap  ? Type 2 diabetes mellitus ([place])   ?  ? ?No Known Allergies ? ?Current Outpatient Medications on File Prior to Visit  ?Medication Sig  ? aspirin EC 81 MG tablet Take 81 mg by mouth daily.  ?  Cholecalciferol (VITAMIN D-3) 125 MCG (5000 UT) TABS Take 15,000 Units by mouth daily. (Patient taking differently: Take by mouth 2 (two) times daily.)  ? CINNAMON PO Take 1,000 mg by mouth. Takes 2 tablets twice a day.  ? glimepiride (AMARYL) 4 MG tablet Take 1 tablet 2 x /day with Meals for Diabetes  ? LANTUS SOLOSTAR 100 UNIT/ML Solostar Pen INJECT 30 TO 40 UNITS DAILY FOR DIABETES  ? lisinopril (ZESTRIL) 20 MG tablet Take 1 tablet (20 mg total) by mouth daily.  ? metFORMIN (GLUCOPHAGE-XR) 500 MG 24 hr tablet TAKE 2 TABLETS TWICE A DAY WITH MEALS FOR DIABETES  ? OZEMPIC, 1 MG/DOSE, 4 MG/3ML SOPN INJECT 1 MG UNDER THE SKIN ONCE WEEKLY  ? sildenafil (VIAGRA) 100 MG tablet TAKE HALF TO ONE TABLET BY MOUTH DAILY IF NEEDED  ? Testosterone 20.25 MG/ACT (1.62%) GEL Apply 4 pumps Daily  ? glucose blood (FREESTYLE LITE) test strip CHECK BLOOD SUGAR 1 TIME A DAY-dx-E11.29.  ? glucose monitoring kit (FREESTYLE) monitoring kit Check blood sugar 1 time a day-DX-E11.29  ? Lancets (FREESTYLE) lancets CHECK BLOOD SUGAR 1 TIME A DAY-DX-E11.29.  ? ?No current facility-administered medications on file prior to visit.  ? ? ?ROS: all negative except above.  ? ?Physical Exam: ? ?BP (!) 142/80   Pulse 98   Temp 97.7 ?F (36.5 ?C)   Wt (!) 304 lb 9.6 oz (138.2 kg)   SpO2  97%   BMI 41.03 kg/m?  ? ?General Appearance: Well nourished, in no apparent distress. ?Eyes: PERRLA, EOMs, conjunctiva no swelling or erythema ?Sinuses: No Frontal/maxillary tenderness ?ENT/Mouth: Ext aud canals clear, TMs without erythema, bulging. No erythema, swelling, or exudate on post pharynx.  Tonsils not swollen or erythematous. Hearing normal.  ?Neck: Supple, thyroid normal.  ?Respiratory: Respiratory effort normal, BS equal bilaterally without rales, rhonchi, wheezing or stridor.  ?Cardio: RRR with no MRGs. Brisk peripheral pulses without edema.  ?Abdomen: Soft, + BS.  Non tender, no guarding, rebound, hernias, masses. ?Lymphatics: Non tender without  lymphadenopathy.  ?Musculoskeletal: Full ROM, 5/5 strength, normal gait.  ?Skin: Warm, dry . I&D is healing well on right upper buttock, no S/S of infection- iodoform packing removed, no further drainage ?Neuro: Cranial nerves intact. Normal muscle tone, no cerebellar symptoms. Sensation intact.  ?Psych: Awake and oriented X 3, normal affect, Insight and Judgment appropriate.  ?  ? ?Magda Bernheim, NP ?11:33 AM ?[facility] ? ?

## 2021-07-12 ENCOUNTER — Ambulatory Visit (INDEPENDENT_AMBULATORY_CARE_PROVIDER_SITE_OTHER): Admitting: Nurse Practitioner

## 2021-07-12 ENCOUNTER — Encounter: Payer: Self-pay | Admitting: Nurse Practitioner

## 2021-07-12 ENCOUNTER — Other Ambulatory Visit: Payer: Self-pay

## 2021-07-12 VITALS — BP 142/80 | HR 98 | Temp 97.7°F | Wt 304.6 lb

## 2021-07-12 DIAGNOSIS — I10 Essential (primary) hypertension: Secondary | ICD-10-CM

## 2021-07-12 DIAGNOSIS — Z09 Encounter for follow-up examination after completed treatment for conditions other than malignant neoplasm: Secondary | ICD-10-CM

## 2021-07-12 DIAGNOSIS — D235 Other benign neoplasm of skin of trunk: Secondary | ICD-10-CM

## 2021-08-25 ENCOUNTER — Ambulatory Visit: Admitting: Adult Health

## 2021-08-25 ENCOUNTER — Telehealth: Payer: Self-pay | Admitting: Radiation Oncology

## 2021-08-25 DIAGNOSIS — R7309 Other abnormal glucose: Secondary | ICD-10-CM

## 2021-08-25 NOTE — Progress Notes (Deleted)
3 MONTH FOLLOW UP   Assessment and Plan:  Type 2 diabetes mellitus with diabetic nephropathy (HCC) POORLY CONTROLLED -  He is on metformin 2000 mg, glimepiride 4 mg BID, ozempic 1 mg, glargine 40 units. He admits to poor diet/exercise. We dicussed risks of uncontrolled diabetes including cardiovascular disease, vision, kidney, neuropathy and death. *** He declines med changes at this time, states would like to work on diet/lifestyle. Encouraged food/glucose log with close follow up. He is able to verbalize low carb diet, fasting glucose <130 goal   Education: Reviewed 'ABCs' of diabetes management (respective goals in parentheses):  A1C (<7), blood pressure (<130/80), and cholesterol (LDL <70) Eye Exam yearly and Dental Exam every 6 months. Dietary recommendations Physical Activity recommendations -     Hemoglobin A1c ([facility])  Hyperlipidemia associated with type 2 diabetes mellitus (HCC) LDL <70, discussed CVD risks with T2DM, benefit of statins, will consider but declines at this time.  decrease fatty foods increase activity.  -     Lipid Profile  CKD stage 3 due to type 2 diabetes mellitus (HCC)/ Microalbuminuria (HCC) Emphasized better glucose control, increase water, control BP Continue lisinopril, titate as able Consider amlodipine, SGLT2i, kerendia - declined med changes at this visit   Erectile dysfunction associated with type 2 diabetes mellitus ([place]) Need to get better control of sugars  Retinopathy, diabetic ([place]) Continue ophth, emphasized need for improved glucose control   Morbid obesity ([place]) - BMI 40 with OSA - follow up 3 months for progress monitoring - increase veggies, decrease carbs - long discussion about weight loss, diet, and exercise - phentermine does help, plans to restart, denies SE  Hereditary hemochromatosis (HCC) Monitor labs, check CBC/iron/ferritin  Essential hypertension - continue medications, atypically elevated today but last very well  controlled, start checking at home and send values in a few weeks - DASH diet, exercise and monitor at home. Call if greater than 130/80.   OSA on CPAP Sleep apnea- continue CPAP, CPAP is helping with daytime fatigue, weight loss still advised.   Medication management -     Magnesium  Vitamin D deficiency Continue supplement   Testosterone deficiency *** -     Testosterone, Total  Prostate cancer [facility]) New dx 06/2021, Dr. Louis Meckel following, was referred for radiation   No orders of the defined types were placed in this encounter.    Discussed med's effects and SE's. labs and tests as requested with regular follow-up as recommended.  Future Appointments  Date Time Provider [place]  08/25/2021  4:00 PM Liane Comber, NP [facility] None  05/10/2022  3:00 PM Liane Comber, NP [facility] None    HPI 61 y.o. AA male  presents for 3 month follow up on poorly controlled T2DM, hld, htn, CKD, ED, morbid obesity.   He was recently dx with prostate cancer in 06/2021;  Elevated PSAs since 04/2019, was referred to [facility] Dx with stage T1c, biopsies 06/17/2021 showed 5/12/cores positive: 3+4= 7 right lateral base and left apex Dr. Louis Meckel following, referral for radiation has been placed.   He has OSA on CPAP, reports 90% compliance with restorative sleep.   BMI is There is no height or weight on file to calculate BMI., he has been working on diet and exercise, active with yard and chopping wood. Rides a road bike twice a week. No caffeine. Drinks water/unsweet tea. Admits eats more during winter, phentermine does help with appetite *** Wt Readings from Last 3 Encounters:  07/12/21 (!) 304 lb 9.6  oz (138.2 kg)  06/28/21 (!) 308 lb 12.8 oz (140.1 kg)  05/06/21 (!) 302 lb 3.2 oz (137.1 kg)   His blood pressure is not checked at home, he is on lisinopril 20 mg, today their BP is  , last check was 100/70 He does workout.  He denies chest pain, shortness of breath,  dizziness.   Hyperlipidemia not statin, cholesterol typically at LDL <70, has been declining statin. Last lipids were:  Lab Results  Component Value Date   CHOL 143 05/06/2021   HDL 50 05/06/2021   LDLCALC 72 05/06/2021   TRIG 120 05/06/2021   CHOLHDL 2.9 05/06/2021   He has not been working on diet and exercise for diabetes, long hx of poorly controlled A1C on review with patient today  with CKD III he is on lisinopril  ED, has viagra, urology follows He is on ASA MF 2000 mg a day total He is on lantus 40 units Ozempic 1 mg weekly He is on amaryl 4 mg 1 pill BID Eye exam: Dr. Sabra Heck 2022 with retinopathy Meter: Freestyle Reports fasting 160-185  denies paresthesia of the feet, polydipsia and polyuria.  Last A1C in the office was:  Lab Results  Component Value Date   HGBA1C 9.0 (H) 05/06/2021   Lab Results  Component Value Date   EGFR 50 (L) 05/06/2021   Persistent microalbuminuria, has been on lisinopril 20 mg *** max *** SGLT2i *** Lab Results  Component Value Date   MICRALBCREAT 400 (H) 05/06/2021   Patient is on Vitamin D supplement, taking 10000 IU daily Lab Results  Component Value Date   VD25OH 4 05/06/2021   He is has a history of testosterone deficiency and is on testosterone replacement at this time, only on 2 pumps each arm a day  He states that the testosterone helps with his energy, libido, muscle mass.  Lab Results  Component Value Date   TESTOSTERONE 327 05/06/2021   Hx of hemochromatosis, currently monitoring only, did see hematologist, switching off of testosterone injections helped.  Lab Results  Component Value Date   IRON 105 05/06/2021   TIBC 229 (L) 05/06/2021   FERRITIN 222 05/06/2021      Latest Ref Rng & Units 05/06/2021    4:24 PM 01/25/2021    4:24 PM 04/30/2020    4:02 PM  CBC  WBC 3.8 - 10.8 Thousand/uL 5.7   5.6   5.4    Hemoglobin 13.2 - 17.1 g/dL 16.0   15.5   16.3    Hematocrit 38.5 - 50.0 % 46.8   46.2   48.5    Platelets  140 - 400 Thousand/uL 233   210   236      Current Medications:   Current Outpatient Medications (Endocrine & Metabolic):    glimepiride (AMARYL) 4 MG tablet, Take 1 tablet 2 x /day with Meals for Diabetes   LANTUS SOLOSTAR 100 UNIT/ML Solostar Pen, INJECT 30 TO 40 UNITS DAILY FOR DIABETES   metFORMIN (GLUCOPHAGE-XR) 500 MG 24 hr tablet, TAKE 2 TABLETS TWICE A DAY WITH MEALS FOR DIABETES   OZEMPIC, 1 MG/DOSE, 4 MG/3ML SOPN, INJECT 1 MG UNDER THE SKIN ONCE WEEKLY   Testosterone 20.25 MG/ACT (1.62%) GEL, Apply 4 pumps Daily  Current Outpatient Medications (Cardiovascular):    lisinopril (ZESTRIL) 20 MG tablet, Take 1 tablet (20 mg total) by mouth daily.   sildenafil (VIAGRA) 100 MG tablet, TAKE HALF TO ONE TABLET BY MOUTH DAILY IF NEEDED   Current Outpatient Medications (  Analgesics):    aspirin EC 81 MG tablet, Take 81 mg by mouth daily.   Current Outpatient Medications (Other):    Cholecalciferol (VITAMIN D-3) 125 MCG (5000 UT) TABS, Take 15,000 Units by mouth daily. (Patient taking differently: Take by mouth 2 (two) times daily.)   CINNAMON PO, Take 1,000 mg by mouth. Takes 2 tablets twice a day.   glucose blood (FREESTYLE LITE) test strip, CHECK BLOOD SUGAR 1 TIME A DAY-dx-E11.29.   glucose monitoring kit (FREESTYLE) monitoring kit, Check blood sugar 1 time a day-DX-E11.29   Lancets (FREESTYLE) lancets, CHECK BLOOD SUGAR 1 TIME A DAY-DX-E11.29.  Medical History:  Past Medical History:  Diagnosis Date   Chronic kidney disease    Erectile dysfunction 09/11/2014   History of kidney stones    Hypertension    Hypogonadism male    Left ureteral stone    OSA on CPAP    Right bundle branch block    Sleep apnea    cpap   Type 2 diabetes mellitus (HCC)    Allergies No Known Allergies  SURGICAL HISTORY He  has a past surgical history that includes Ankle surgery (Left, 2002); Rhinoplasty (2001); Extracorporeal shock wave lithotripsy (Left, 09-25-2014);  Cystoscopy/retrograde/ureteroscopy/stone extraction with basket (Left, 12/05/2014); Cystoscopy w/ retrogrades (Left, 12/05/2014); and Colonoscopy (2018).   FAMILY HISTORY His family history includes Breast cancer (age of onset: 70) in his mother; Diabetes in his father; Heart attack in his paternal grandfather; Hypertension in his mother; Uterine cancer (age of onset: 43) in his daughter.   SOCIAL HISTORY He  reports that he has never smoked. He has never used smokeless tobacco. He reports current alcohol use. He reports that he does not use drugs.   Review of Systems  Constitutional:  Negative for malaise/fatigue and weight loss.  HENT:  Negative for hearing loss and tinnitus.   Eyes:  Negative for blurred vision and double vision.  Respiratory:  Negative for cough, sputum production, shortness of breath and wheezing.   Cardiovascular:  Negative for chest pain, palpitations, orthopnea, claudication, leg swelling and PND.  Gastrointestinal:  Negative for abdominal pain, blood in stool, constipation, diarrhea, heartburn, melena, nausea and vomiting.  Genitourinary: Negative.   Musculoskeletal:  Negative for falls, joint pain and myalgias.  Skin:  Negative for rash.  Neurological:  Negative for dizziness, tingling, sensory change, weakness and headaches.  Endo/Heme/Allergies:  Negative for polydipsia.  Psychiatric/Behavioral: Negative.  Negative for depression, memory loss, substance abuse and suicidal ideas. The patient is not nervous/anxious and does not have insomnia.   All other systems reviewed and are negative.  Physical Exam: Estimated body mass index is 41.03 kg/m as calculated from the following:   Height as of 05/06/21: 6' 0.25" (1.835 m).   Weight as of 07/12/21: 304 lb 9.6 oz (138.2 kg). There were no vitals taken for this visit. General Appearance: Well nourished, in no apparent distress. Eyes: PERRLA, EOMs, conjunctiva no swelling or erythema Sinuses: No Frontal/maxillary  tenderness ENT/Mouth: Ext aud canals clear after removal in the office, normal light reflex with TMs without erythema after bilateral ears were cleaned in the office, bulging. Good dentition. No erythema, swelling, or exudate on post pharynx. Tonsils not swollen or erythematous. Hearing normal.  Neck: Supple, thyroid normal. No bruits Respiratory: Respiratory effort normal, BS equal bilaterally without rales, rhonchi, wheezing or stridor. Cardio: RRR without murmurs, rubs or gallops. Brisk peripheral pulses without edema.  Chest: symmetric, with normal excursions and percussion. Abdomen: Soft, +BS. Non tender, no guarding, rebound,  hernias, masses, or organomegaly. .  Lymphatics: Non tender without lymphadenopathy.  Genitourinary: defer to urologist Musculoskeletal: Full ROM all peripheral extremities,5/5 strength, and normal gait.Marland Kitchen  Neuro: Cranial nerves intact, reflexes equal bilaterally. Normal muscle tone, no cerebellar symptoms. Sensation intact to monofilament.   Psych: Awake and oriented X 3, normal affect, Insight and Judgment appropriate.  Skin: Warm, dry without rashes, ecchymosis  Izora Ribas, NP 12:24 PM [facility]

## 2021-08-25 NOTE — Telephone Encounter (Signed)
Unable to LVM to schedule consult with Dr. Tammi Klippel due to full mailbox. ?

## 2021-09-03 NOTE — Progress Notes (Addendum)
GU Location of Tumor / Histology: Prostate Ca ? ?If Prostate Cancer, Gleason Score is (3 + 4) and PSA is (9.86 as of 03/2021) ? ?Biopsies: ?Dr. Louis Meckel ? ? ? ? ?Past/Anticipated interventions by urology, if any:  ? ?Past/Anticipated interventions by medical oncology, if any:  ? ?Weight changes, if any: No ? ?IPSS:  2 ?SHIM: 19 ? ?Bowel/Bladder complaints, if any:  No ? ?Nausea/Vomiting, if any: No ? ?Pain issues, if any:  0/10 ? ?SAFETY ISSUES: ?Prior radiation?  No ?Pacemaker/ICD? No ?Possible current pregnancy? Male ?Is the patient on methotrexate? No ? ?Current Complaints / other details:   ?

## 2021-09-06 ENCOUNTER — Other Ambulatory Visit: Payer: Self-pay

## 2021-09-06 ENCOUNTER — Ambulatory Visit
Admission: RE | Admit: 2021-09-06 | Discharge: 2021-09-06 | Disposition: A | Discharge status: Home / Self Care | Source: Ambulatory Visit | Attending: Radiation Oncology | Admitting: Radiation Oncology

## 2021-09-06 VITALS — BP 147/74 | HR 98 | Temp 97.9°F | Resp 20 | Ht 74.0 in | Wt 303.4 lb

## 2021-09-06 DIAGNOSIS — E119 Type 2 diabetes mellitus without complications: Secondary | ICD-10-CM | POA: Diagnosis not present

## 2021-09-06 DIAGNOSIS — Z794 Long term (current) use of insulin: Secondary | ICD-10-CM | POA: Diagnosis not present

## 2021-09-06 DIAGNOSIS — I129 Hypertensive chronic kidney disease with stage 1 through stage 4 chronic kidney disease, or unspecified chronic kidney disease: Secondary | ICD-10-CM | POA: Diagnosis not present

## 2021-09-06 DIAGNOSIS — C61 Malignant neoplasm of prostate: Secondary | ICD-10-CM | POA: Diagnosis present

## 2021-09-06 DIAGNOSIS — Z8041 Family history of malignant neoplasm of ovary: Secondary | ICD-10-CM | POA: Diagnosis not present

## 2021-09-06 DIAGNOSIS — Z7984 Long term (current) use of oral hypoglycemic drugs: Secondary | ICD-10-CM | POA: Diagnosis not present

## 2021-09-06 DIAGNOSIS — Z87442 Personal history of urinary calculi: Secondary | ICD-10-CM | POA: Diagnosis not present

## 2021-09-06 DIAGNOSIS — Z803 Family history of malignant neoplasm of breast: Secondary | ICD-10-CM | POA: Diagnosis not present

## 2021-09-06 DIAGNOSIS — N189 Chronic kidney disease, unspecified: Secondary | ICD-10-CM | POA: Diagnosis not present

## 2021-09-06 DIAGNOSIS — Z7982 Long term (current) use of aspirin: Secondary | ICD-10-CM | POA: Insufficient documentation

## 2021-09-06 NOTE — Progress Notes (Signed)
?Radiation Oncology         (336) [phone] ?________________________________ ? ?Initial Outpatient Consultation ? ?Name: Carlos [name] MRN: [id number]  ?Date: 09/06/2021  DOB: December 25, 1960 ? ?SJ:[id number], Gwyndolyn Saxon, MD  Ardis Hughs, MD  ? ?REFERRING PHYSICIAN: Ardis Hughs, MD ? ?DIAGNOSIS: 61 y.o. gentleman with Stage T1c adenocarcinoma of the prostate with Gleason score of 3+4, and PSA of 11.3. ? ?  ICD-10-CM   ?1. Prostate cancer ([place])  C61   ?  ? ? ?HISTORY OF PRESENT ILLNESS: Carlos [name] is a 61 y.o. male with a diagnosis of prostate cancer. He was noted to have an elevated PSA of 11.3 in 5/22 by his primary care physician, Dr. Melford Aase.  Accordingly, he was referred for evaluation in urology by Dr. Louis Meckel,  digital rectal examination was performed at that time revealing no nodules. PSA was repeated at 9.23 in 8/22 and increased to 9.65 on 03/16/21.  The patient proceeded to transrectal ultrasound with 12 biopsies of the prostate on 06/17/21.  The prostate volume measured 40 cc.  Out of 12 core biopsies, 5 were positive.  The maximum Gleason score was 3+4, and this was seen bilaterally. ? ?The patient reviewed the biopsy results with his urologist and he has kindly been referred today for discussion of potential radiation treatment options. ? ? ?PREVIOUS RADIATION THERAPY: No ? ?PAST MEDICAL HISTORY:  ?Past Medical History:  ?Diagnosis Date  ? Chronic kidney disease   ? Erectile dysfunction 09/11/2014  ? History of kidney stones   ? Hypertension   ? Hypogonadism male   ? Left ureteral stone   ? OSA on CPAP   ? Right bundle branch block   ? Sleep apnea   ? cpap  ? Type 2 diabetes mellitus ([place])   ?   ? ?PAST SURGICAL HISTORY: ?Past Surgical History:  ?Procedure Laterality Date  ? ANKLE SURGERY Left 2002  ? COLONOSCOPY  2018  ? CYSTOSCOPY W/ RETROGRADES Left 12/05/2014  ? Procedure: CYSTOSCOPY WITH RETROGRADE PYELOGRAM;  Surgeon: Ardis Hughs, MD;  [facility];  Service:  Urology;  Laterality: Left;  ? CYSTOSCOPY/RETROGRADE/URETEROSCOPY/STONE EXTRACTION WITH BASKET Left 12/05/2014  ? Procedure: LEFT  URETEROSCOPY, STONE EXTRACTION ;  Surgeon: Ardis Hughs, MD;  Location[facility];  Service: Urology;  Laterality: Left;  ? EXTRACORPOREAL SHOCK WAVE LITHOTRIPSY Left 09-25-2014  ? RHINOPLASTY  2001  ? ? ?FAMILY HISTORY:  ?Family History  ?Problem Relation Age of Onset  ? Hypertension Mother   ? Breast cancer Mother 39  ? Diabetes Father   ? Heart attack Paternal Grandfather   ? Uterine cancer Daughter 45  ? Colon cancer Neg Hx   ? Colon polyps Neg Hx   ? Esophageal cancer Neg Hx   ? Rectal cancer Neg Hx   ? Stomach cancer Neg Hx   ? ? ?SOCIAL HISTORY:  ?Social History  ? ?Socioeconomic History  ? Marital status: Married  ?  Spouse name: Not on file  ? Number of children: Not on file  ? Years of education: Not on file  ? Highest education level: Not on file  ?Occupational History  ? Not on file  ?Tobacco Use  ? Smoking status: Never  ? Smokeless tobacco: Never  ?Vaping Use  ? Vaping Use: Never used  ?Substance and Sexual Activity  ? Alcohol use: Yes  ?  Comment: RARE  ? Drug use: No  ? Sexual activity: Yes  ?  Partners: Female  ?  Other Topics Concern  ? Not on file  ?Social History Narrative  ? Not on file  ? ?Social Determinants of Health  ? ?Financial Resource Strain: Not on file  ?Food Insecurity: Not on file  ?Transportation Needs: Not on file  ?Physical Activity: Not on file  ?Stress: Not on file  ?Social Connections: Not on file  ?Intimate Partner Violence: Not on file  ? ? ?ALLERGIES: Patient has no known allergies. ? ?MEDICATIONS:  ?Current Outpatient Medications  ?Medication Sig Dispense Refill  ? aspirin EC 81 MG tablet Take 81 mg by mouth daily.    ? Cholecalciferol (VITAMIN D-3) 125 MCG (5000 UT) TABS Take 15,000 Units by mouth daily. (Patient taking differently: Take by mouth 2 (two) times daily.) 30 tablet 0  ? CINNAMON PO Take 1,000 mg by mouth. Takes 2  tablets twice a day.    ? glimepiride (AMARYL) 4 MG tablet Take 1 tablet 2 x /day with Meals for Diabetes 180 tablet 1  ? glucose blood (FREESTYLE LITE) test strip CHECK BLOOD SUGAR 1 TIME A DAY-dx-E11.29. 100 each 12  ? glucose monitoring kit (FREESTYLE) monitoring kit Check blood sugar 1 time a day-DX-E11.29 1 each 0  ? Lancets (FREESTYLE) lancets CHECK BLOOD SUGAR 1 TIME A DAY-DX-E11.29. 100 each 12  ? LANTUS SOLOSTAR 100 UNIT/ML Solostar Pen INJECT 30 TO 40 UNITS DAILY FOR DIABETES 30 mL 3  ? lisinopril (ZESTRIL) 20 MG tablet Take 1 tablet (20 mg total) by mouth daily. 90 tablet 0  ? metFORMIN (GLUCOPHAGE-XR) 500 MG 24 hr tablet TAKE 2 TABLETS TWICE A DAY WITH MEALS FOR DIABETES 360 tablet 3  ? OZEMPIC, 1 MG/DOSE, 4 MG/3ML SOPN INJECT 1 MG UNDER THE SKIN ONCE WEEKLY 6 mL 5  ? sildenafil (VIAGRA) 100 MG tablet TAKE HALF TO ONE TABLET BY MOUTH DAILY IF NEEDED 30 tablet 0  ? Testosterone 20.25 MG/ACT (1.62%) GEL Apply 4 pumps Daily (Patient not taking: Reported on 09/06/2021) 450 g 2  ? ?No current facility-administered medications for this encounter.  ? ? ?REVIEW OF SYSTEMS:  On review of systems, the patient reports that he is doing well overall. He denies any chest pain, shortness of breath, cough, fevers, chills, night sweats, unintended weight changes. He denies any bowel disturbances, and denies abdominal pain, nausea or vomiting. He denies any new musculoskeletal or joint aches or pains. His IPSS was Total Score: 2, indicating mild urinary symptoms (Reference 0-7 mild, 8-19 moderate, 20-35 severe).  His SHIM: 19, indicating he has mild erectile dysfunction (Reference - 22-25 None, 17-21 Mild, 8-16 Moderate, 1-7 Severe). A complete review of systems is obtained and is otherwise negative. ?  ?PHYSICAL EXAM:  ?Wt Readings from Last 3 Encounters:  ?09/06/21 (!) 303 lb 6.4 oz (137.6 kg)  ?07/12/21 (!) 304 lb 9.6 oz (138.2 kg)  ?06/28/21 (!) 308 lb 12.8 oz (140.1 kg)  ? ?Temp Readings from Last 3 Encounters:   ?09/06/21 97.9 ?F (36.6 ?C)  ?07/12/21 97.7 ?F (36.5 ?C)  ?06/28/21 (!) 97.5 ?F (36.4 ?C)  ? ?BP Readings from Last 3 Encounters:  ?09/06/21 (!) 147/74  ?07/12/21 (!) 142/80  ?06/28/21 140/82  ? ?Pulse Readings from Last 3 Encounters:  ?09/06/21 98  ?07/12/21 98  ?06/28/21 94  ? ?Pain Assessment ?Pain Score: 0-No pain/10 ? ?In general this is a well appearing gentleman in no acute distress. He's alert and oriented x4 and appropriate throughout the examination. Cardiopulmonary assessment is negative for acute distress, and he exhibits normal effort.   ? ?  KPS = 100 ? ?100 - Normal; no complaints; no evidence of disease. ?90   - Able to carry on normal activity; minor signs or symptoms of disease. ?80   - Normal activity with effort; some signs or symptoms of disease. ?63   - Cares for self; unable to carry on normal activity or to do active work. ?60   - Requires occasional assistance, but is able to care for most of his personal needs. ?50   - Requires considerable assistance and frequent medical care. ?21   - Disabled; requires special care and assistance. ?30   - Severely disabled; hospital admission is indicated although death not imminent. ?20   - Very sick; hospital admission necessary; active supportive treatment necessary. ?10   - Moribund; fatal processes progressing rapidly. ?0     - Dead ? ?Karnofsky DA, Abelmann WH, Craver LS and Burchenal [employer] [phone]) The use of the nitrogen mustards in the palliative treatment of carcinoma: with particular reference to bronchogenic carcinoma Cancer 1 634-56 ? ?LABORATORY DATA:  ?Lab Results  ?Component Value Date  ? WBC 5.7 05/06/2021  ? HGB 16.0 05/06/2021  ? HCT 46.8 05/06/2021  ? MCV 85.1 05/06/2021  ? PLT 233 05/06/2021  ? ?Lab Results  ?Component Value Date  ? NA 143 05/06/2021  ? K 4.0 05/06/2021  ? CL 107 05/06/2021  ? CO2 31 05/06/2021  ? ?Lab Results  ?Component Value Date  ? ALT 26 05/06/2021  ? AST 18 05/06/2021  ? ALKPHOS 108 (H) 10/13/2016  ? BILITOT 1.0  05/06/2021  ? ?  ?RADIOGRAPHY: No results found. ?   ?IMPRESSION/PLAN: ?1. 61 y.o. gentleman with Stage T1c adenocarcinoma of the prostate with Gleason score of 3+4, and PSA of 11.3.. ?We discussed the patient's wor

## 2021-09-06 NOTE — Progress Notes (Signed)
Introduced myself to the patient as the prostate nurse navigator.  No barriers to care identified at this time.  He is here to discuss his radiation treatment options.  I gave him my business card and asked him to call me with questions or concerns.  Verbalized understanding.  ?

## 2021-09-10 ENCOUNTER — Telehealth: Payer: Self-pay | Admitting: *Deleted

## 2021-09-10 NOTE — Telephone Encounter (Signed)
CALLED PATIENT TO INFORM OF PRE-SEED APPTS. FOR 09-30-21, SPOKE WITH PATIENT AND HE IS AWARE OF THESE APPTS. ?

## 2021-09-20 ENCOUNTER — Other Ambulatory Visit: Payer: Self-pay | Admitting: Urology

## 2021-09-20 ENCOUNTER — Telehealth: Payer: Self-pay | Admitting: *Deleted

## 2021-09-20 NOTE — Telephone Encounter (Signed)
CALLED PATIENT TO INFORM OF PRE-SEED APPTS. AND IMPLANT, LVM FOR A RETURN CALL

## 2021-09-29 ENCOUNTER — Telehealth: Payer: Self-pay | Admitting: *Deleted

## 2021-09-29 NOTE — Progress Notes (Signed)
Spoke with Carlos [name] at dr [employer] office pt has current ekg in epic and meets [facility] guidelines.

## 2021-09-29 NOTE — Progress Notes (Signed)
  Radiation Oncology         [phone] ________________________________  Name: Carlos [name] MRN: [id number]  Date: 09/30/2021  DOB: 06-12-1960  SIMULATION AND TREATMENT PLANNING NOTE PUBIC ARCH STUDY  VF:[id number], Gwyndolyn Saxon, MD  Ardis Hughs, MD  DIAGNOSIS:  61 y.o. gentleman with Stage T1c adenocarcinoma of the prostate with Gleason score of 3+4, and PSA of 11.3.  Oncology History   No history exists.      ICD-10-CM   1. Prostate cancer ([place])  C61       COMPLEX SIMULATION:  The patient presented today for evaluation for possible prostate seed implant. He was brought to the radiation planning suite and placed supine on the CT couch. A 3-dimensional image study set was obtained in upload to the planning computer. There, on each axial slice, I contoured the prostate gland. Then, using three-dimensional radiation planning tools I reconstructed the prostate in view of the structures from the transperineal needle pathway to assess for possible pubic arch interference. In doing so, I did not appreciate any pubic arch interference. Also, the patient's prostate volume was estimated based on the drawn structure. The volume was 40 cc.  Given the pubic arch appearance and prostate volume, patient remains a good candidate to proceed with prostate seed implant. Today, he freely provided informed written consent to proceed.    PLAN: The patient will undergo prostate seed implant.   ________________________________  Sheral Apley. Tammi Klippel, M.D.

## 2021-09-29 NOTE — Telephone Encounter (Signed)
CALLED PATIENT TO REMIND OF PRE-SEED APPTS. FOR 09-30-21, LVM FOR A RETURN CALL

## 2021-09-30 ENCOUNTER — Ambulatory Visit
Admission: RE | Admit: 2021-09-30 | Discharge: 2021-09-30 | Disposition: A | Discharge status: Home / Self Care | Source: Ambulatory Visit | Attending: Urology | Admitting: Urology

## 2021-09-30 ENCOUNTER — Encounter: Payer: Self-pay | Admitting: Urology

## 2021-09-30 ENCOUNTER — Ambulatory Visit
Admission: RE | Admit: 2021-09-30 | Discharge: 2021-09-30 | Disposition: A | Discharge status: Home / Self Care | Source: Ambulatory Visit | Attending: Radiation Oncology | Admitting: Radiation Oncology

## 2021-09-30 ENCOUNTER — Other Ambulatory Visit: Payer: Self-pay

## 2021-09-30 VITALS — Resp 19 | Ht 74.0 in | Wt 302.0 lb

## 2021-09-30 DIAGNOSIS — C61 Malignant neoplasm of prostate: Secondary | ICD-10-CM | POA: Diagnosis not present

## 2021-09-30 NOTE — Progress Notes (Signed)
Pre-seed appointment. I verified pateint's identity and began nursing interview. Patient is doing well. No issues reported at this time.  Meaningful use complete. No urinary management medications. Urology appointment.- None- Per patient.  Resp 19   Ht '6\' 2"'$  (1.88 m)   Wt (!) 302 lb (137 kg)   BMI 38.77 kg/m

## 2021-11-13 ENCOUNTER — Other Ambulatory Visit: Payer: Self-pay | Admitting: Internal Medicine

## 2021-11-13 ENCOUNTER — Other Ambulatory Visit: Payer: Self-pay | Admitting: Nurse Practitioner

## 2021-12-08 ENCOUNTER — Encounter (HOSPITAL_BASED_OUTPATIENT_CLINIC_OR_DEPARTMENT_OTHER): Payer: Self-pay | Admitting: Urology

## 2021-12-13 ENCOUNTER — Encounter (HOSPITAL_BASED_OUTPATIENT_CLINIC_OR_DEPARTMENT_OTHER): Payer: Self-pay | Admitting: Urology

## 2021-12-13 NOTE — Progress Notes (Signed)
Spoke w/ via phone for pre-op interview--- pt Lab needs dos----  [employer] results------ current ekg in epic/ chart COVID test -----patient states asymptomatic no test needed Arrive at ------- 1015 on 12-16-2021 NPO after MN NO Solid Food.  Clear liquids from MN until--- 0915 Med rec completed Medications to take morning of surgery ----- none Diabetic medication ----- do not take diabetic mediations and do not do lantus insulin morning of surgery Patient instructed no nail polish to be worn day of surgery Patient instructed to bring photo id and insurance card day of surgery Patient aware to have Driver (ride ) / caregiver  for 24 hours after surgery --wife, jacqueline Patient Special Instructions ----- will do one fleet enema morning of surgery Pre-Op special Istructions ----- no Patient verbalized understanding of instructions that were given at this phone interview. Patient denies shortness of breath, chest pain, fever, cough at this phone interview.

## 2021-12-15 ENCOUNTER — Telehealth: Payer: Self-pay | Admitting: *Deleted

## 2021-12-15 NOTE — Telephone Encounter (Signed)
Called patient to remind of procedure for 12-16-21, unable to leave message due to vm being full

## 2021-12-15 NOTE — Telephone Encounter (Signed)
Called patient to remind of procedure for 12-16-21, spoke with patient and he is aware of this procedure

## 2021-12-16 ENCOUNTER — Ambulatory Visit (HOSPITAL_BASED_OUTPATIENT_CLINIC_OR_DEPARTMENT_OTHER): Admitting: Anesthesiology

## 2021-12-16 ENCOUNTER — Encounter (HOSPITAL_BASED_OUTPATIENT_CLINIC_OR_DEPARTMENT_OTHER)
Admission: RE | Disposition: A | Discharge status: Home / Self Care | Payer: Self-pay | Source: Home / Self Care | Attending: Urology

## 2021-12-16 ENCOUNTER — Encounter (HOSPITAL_BASED_OUTPATIENT_CLINIC_OR_DEPARTMENT_OTHER): Payer: Self-pay | Admitting: Urology

## 2021-12-16 ENCOUNTER — Ambulatory Visit (HOSPITAL_BASED_OUTPATIENT_CLINIC_OR_DEPARTMENT_OTHER)
Admission: RE | Admit: 2021-12-16 | Discharge: 2021-12-16 | Disposition: A | Discharge status: Home / Self Care | Attending: Urology | Admitting: Urology

## 2021-12-16 ENCOUNTER — Ambulatory Visit (HOSPITAL_COMMUNITY)

## 2021-12-16 DIAGNOSIS — N189 Chronic kidney disease, unspecified: Secondary | ICD-10-CM | POA: Diagnosis not present

## 2021-12-16 DIAGNOSIS — G4733 Obstructive sleep apnea (adult) (pediatric): Secondary | ICD-10-CM | POA: Diagnosis not present

## 2021-12-16 DIAGNOSIS — Z7984 Long term (current) use of oral hypoglycemic drugs: Secondary | ICD-10-CM

## 2021-12-16 DIAGNOSIS — I509 Heart failure, unspecified: Secondary | ICD-10-CM

## 2021-12-16 DIAGNOSIS — C61 Malignant neoplasm of prostate: Secondary | ICD-10-CM | POA: Diagnosis present

## 2021-12-16 DIAGNOSIS — I120 Hypertensive chronic kidney disease with stage 5 chronic kidney disease or end stage renal disease: Secondary | ICD-10-CM | POA: Diagnosis not present

## 2021-12-16 DIAGNOSIS — E1122 Type 2 diabetes mellitus with diabetic chronic kidney disease: Secondary | ICD-10-CM

## 2021-12-16 DIAGNOSIS — Z01818 Encounter for other preprocedural examination: Secondary | ICD-10-CM

## 2021-12-16 DIAGNOSIS — I129 Hypertensive chronic kidney disease with stage 1 through stage 4 chronic kidney disease, or unspecified chronic kidney disease: Secondary | ICD-10-CM | POA: Insufficient documentation

## 2021-12-16 DIAGNOSIS — N183 Chronic kidney disease, stage 3 unspecified: Secondary | ICD-10-CM

## 2021-12-16 HISTORY — PX: RADIOACTIVE SEED IMPLANT: SHX5150

## 2021-12-16 HISTORY — DX: Malignant neoplasm of prostate: C61

## 2021-12-16 HISTORY — DX: Hereditary hemochromatosis: E83.110

## 2021-12-16 HISTORY — DX: Polyneuropathy, unspecified: G62.9

## 2021-12-16 HISTORY — PX: SPACE OAR INSTILLATION: SHX6769

## 2021-12-16 HISTORY — DX: Chronic kidney disease, stage 3 unspecified: N18.30

## 2021-12-16 HISTORY — DX: Type 2 diabetes mellitus without complications: E11.9

## 2021-12-16 HISTORY — DX: Long term (current) use of insulin: Z79.4

## 2021-12-16 LAB — POCT I-STAT, CHEM 8
BUN: 18 mg/dL (ref 6–20)
Calcium, Ion: 1.16 mmol/L (ref 1.15–1.40)
Chloride: 105 mmol/L (ref 98–111)
Creatinine, Ser: 1.4 mg/dL — ABNORMAL HIGH (ref 0.61–1.24)
Glucose, Bld: 164 mg/dL — ABNORMAL HIGH (ref 70–99)
HCT: 44 % (ref 39.0–52.0)
Hemoglobin: 15 g/dL (ref 13.0–17.0)
Potassium: 4.1 mmol/L (ref 3.5–5.1)
Sodium: 143 mmol/L (ref 135–145)
TCO2: 23 mmol/L (ref 22–32)

## 2021-12-16 LAB — GLUCOSE, CAPILLARY: Glucose-Capillary: 135 mg/dL — ABNORMAL HIGH (ref 70–99)

## 2021-12-16 SURGERY — INSERTION, RADIATION SOURCE, PROSTATE
Anesthesia: General | Site: Prostate

## 2021-12-16 MED ORDER — FENTANYL CITRATE (PF) 100 MCG/2ML IJ SOLN
INTRAMUSCULAR | Status: DC | PRN
  Administered 2021-12-16 (×2): 50 ug via INTRAVENOUS

## 2021-12-16 MED ORDER — ROCURONIUM BROMIDE 10 MG/ML (PF) SYRINGE
PREFILLED_SYRINGE | INTRAVENOUS | Status: AC
  Filled 2021-12-16: qty 10

## 2021-12-16 MED ORDER — PROPOFOL 10 MG/ML IV BOLUS
INTRAVENOUS | Status: AC
  Filled 2021-12-16: qty 20

## 2021-12-16 MED ORDER — SUGAMMADEX SODIUM 200 MG/2ML IV SOLN
INTRAVENOUS | Status: DC | PRN
  Administered 2021-12-16: 300 mg via INTRAVENOUS

## 2021-12-16 MED ORDER — SODIUM CHLORIDE (PF) 0.9 % IJ SOLN
INTRAMUSCULAR | Status: DC | PRN
  Administered 2021-12-16: 10 mL

## 2021-12-16 MED ORDER — ACETAMINOPHEN 10 MG/ML IV SOLN: 1000.0000 mg | Freq: Once | INTRAVENOUS | Status: DC | PRN

## 2021-12-16 MED ORDER — TRAMADOL HCL 50 MG PO TABS: 50.0000 mg | ORAL_TABLET | Freq: Four times a day (QID) | ORAL | 0 refills | Status: DC | PRN

## 2021-12-16 MED ORDER — ROCURONIUM BROMIDE 10 MG/ML (PF) SYRINGE
PREFILLED_SYRINGE | INTRAVENOUS | Status: DC | PRN
  Administered 2021-12-16: 80 mg via INTRAVENOUS

## 2021-12-16 MED ORDER — ONDANSETRON HCL 4 MG/2ML IJ SOLN
INTRAMUSCULAR | Status: AC
  Filled 2021-12-16: qty 2

## 2021-12-16 MED ORDER — SODIUM CHLORIDE 0.9 % IV SOLN
INTRAVENOUS | Status: AC | PRN
  Administered 2021-12-16: 1000 mL

## 2021-12-16 MED ORDER — ONDANSETRON HCL 4 MG/2ML IJ SOLN
INTRAMUSCULAR | Status: DC | PRN
  Administered 2021-12-16: 4 mg via INTRAVENOUS

## 2021-12-16 MED ORDER — CIPROFLOXACIN IN D5W 400 MG/200ML IV SOLN
INTRAVENOUS | Status: AC
  Filled 2021-12-16: qty 200

## 2021-12-16 MED ORDER — TAMSULOSIN HCL 0.4 MG PO CAPS: 0.4000 mg | ORAL_CAPSULE | Freq: Every day | ORAL | 5 refills | Status: DC

## 2021-12-16 MED ORDER — FENTANYL CITRATE (PF) 100 MCG/2ML IJ SOLN: 25.0000 ug | INTRAMUSCULAR | Status: DC | PRN

## 2021-12-16 MED ORDER — FENTANYL CITRATE (PF) 100 MCG/2ML IJ SOLN
INTRAMUSCULAR | Status: AC
  Filled 2021-12-16: qty 2

## 2021-12-16 MED ORDER — ACETAMINOPHEN 160 MG/5ML PO SOLN: 1000.0000 mg | Freq: Once | ORAL | Status: DC | PRN

## 2021-12-16 MED ORDER — CIPROFLOXACIN IN D5W 400 MG/200ML IV SOLN
400.0000 mg | INTRAVENOUS | Status: AC
  Administered 2021-12-16: 400 mg via INTRAVENOUS

## 2021-12-16 MED ORDER — STERILE WATER FOR IRRIGATION IR SOLN
Status: DC | PRN
  Administered 2021-12-16: 3 mL

## 2021-12-16 MED ORDER — PHENYLEPHRINE 80 MCG/ML (10ML) SYRINGE FOR IV PUSH (FOR BLOOD PRESSURE SUPPORT)
PREFILLED_SYRINGE | INTRAVENOUS | Status: AC
  Filled 2021-12-16: qty 10

## 2021-12-16 MED ORDER — SODIUM CHLORIDE 0.9 % IV SOLN: INTRAVENOUS | Status: DC

## 2021-12-16 MED ORDER — MIDAZOLAM HCL 2 MG/2ML IJ SOLN
INTRAMUSCULAR | Status: DC | PRN
  Administered 2021-12-16: 2 mg via INTRAVENOUS

## 2021-12-16 MED ORDER — AMISULPRIDE (ANTIEMETIC) 5 MG/2ML IV SOLN
INTRAVENOUS | Status: AC
  Filled 2021-12-16: qty 4

## 2021-12-16 MED ORDER — PROPOFOL 10 MG/ML IV BOLUS
INTRAVENOUS | Status: DC | PRN
  Administered 2021-12-16: 100 mg via INTRAVENOUS
  Administered 2021-12-16: 200 mg via INTRAVENOUS

## 2021-12-16 MED ORDER — LACTATED RINGERS IV SOLN: INTRAVENOUS | Status: DC

## 2021-12-16 MED ORDER — MIDAZOLAM HCL 2 MG/2ML IJ SOLN
INTRAMUSCULAR | Status: AC
  Filled 2021-12-16: qty 2

## 2021-12-16 MED ORDER — AMISULPRIDE (ANTIEMETIC) 5 MG/2ML IV SOLN
10.0000 mg | Freq: Once | INTRAVENOUS | Status: AC
  Administered 2021-12-16: 10 mg via INTRAVENOUS

## 2021-12-16 MED ORDER — ACETAMINOPHEN 500 MG PO TABS: 1000.0000 mg | ORAL_TABLET | Freq: Once | ORAL | Status: DC | PRN

## 2021-12-16 MED ORDER — OXYCODONE HCL 5 MG PO TABS: 5.0000 mg | ORAL_TABLET | Freq: Once | ORAL | Status: DC | PRN

## 2021-12-16 MED ORDER — FLEET ENEMA 7-19 GM/118ML RE ENEM: 1.0000 | ENEMA | Freq: Once | RECTAL | Status: DC

## 2021-12-16 MED ORDER — IOHEXOL 300 MG/ML  SOLN
INTRAMUSCULAR | Status: DC | PRN
  Administered 2021-12-16: 7 mL

## 2021-12-16 MED ORDER — OXYCODONE HCL 5 MG/5ML PO SOLN: 5.0000 mg | Freq: Once | ORAL | Status: DC | PRN

## 2021-12-16 SURGICAL SUPPLY — 46 items
BAG URINE DRAIN 2000ML AR STRL (UROLOGICAL SUPPLIES) ×2 IMPLANT
BLADE CLIPPER SENSICLIP SURGIC (BLADE) ×2 IMPLANT
CATH FOLEY 2WAY SLVR  5CC 16FR (CATHETERS) ×2
CATH FOLEY 2WAY SLVR 5CC 16FR (CATHETERS) ×2 IMPLANT
CATH ROBINSON RED A/P 16FR (CATHETERS) IMPLANT
CATH ROBINSON RED A/P 20FR (CATHETERS) ×2 IMPLANT
CLOTH BEACON ORANGE TIMEOUT ST (SAFETY) ×2 IMPLANT
CNTNR URN SCR LID CUP LEK RST (MISCELLANEOUS) ×2 IMPLANT
CONT SPEC 4OZ STRL OR WHT (MISCELLANEOUS) ×2
COVER BACK TABLE 60X90IN (DRAPES) ×2 IMPLANT
COVER MAYO STAND STRL (DRAPES) ×2 IMPLANT
DRSG TEGADERM 4X4.75 (GAUZE/BANDAGES/DRESSINGS) ×2 IMPLANT
DRSG TEGADERM 8X12 (GAUZE/BANDAGES/DRESSINGS) ×2 IMPLANT
GAUZE SPONGES 4X4 IMPLANT
GEL ULTRASOUND 20GR AQUASONIC (MISCELLANEOUS) ×4 IMPLANT
GLOVE BIO SURGEON STRL SZ 6.5 (GLOVE) ×2 IMPLANT
GLOVE BIO SURGEON STRL SZ7.5 (GLOVE) ×4 IMPLANT
GLOVE BIO SURGEON STRL SZ8 (GLOVE) IMPLANT
GLOVE BIOGEL PI IND STRL 6.5 (GLOVE) IMPLANT
GLOVE BIOGEL PI INDICATOR 6.5 (GLOVE)
GLOVE SURG ORTHO 8.5 STRL (GLOVE) ×2 IMPLANT
GLOVE SURG SS PI 6.5 STRL IVOR (GLOVE) IMPLANT
GOWN STRL REUS W/TWL XL LVL3 (GOWN DISPOSABLE) ×2 IMPLANT
GRID BRACH TEMP 18GA 2.8X3X.75 (MISCELLANEOUS) ×2 IMPLANT
HOLDER FOLEY CATH W/STRAP (MISCELLANEOUS) ×2 IMPLANT
IMPL SPACEOAR VUE SYSTEM (Spacer) ×2 IMPLANT
IMPLANT SPACEOAR VUE SYSTEM (Spacer) ×2 IMPLANT
IV NS 1000ML (IV SOLUTION) ×4
IV NS 1000ML BAXH (IV SOLUTION) ×4 IMPLANT
KIT TURNOVER CYSTO (KITS) ×2 IMPLANT
NDL BRACHY 18G 5PK (NEEDLE) ×8 IMPLANT
NDL BRACHY 18G SINGLE (NEEDLE) IMPLANT
NDL PK MORGANSTERN STABILIZ (NEEDLE) ×2 IMPLANT
NEEDLE BRACHY 18G 5PK (NEEDLE) ×8 IMPLANT
NEEDLE BRACHY 18G SINGLE (NEEDLE) IMPLANT
NEEDLE PK MORGANSTERN STABILIZ (NEEDLE) ×2 IMPLANT
PACK CYSTO (CUSTOM PROCEDURE TRAY) ×2 IMPLANT
SHEATH ULTRASOUND LF (SHEATH) IMPLANT
SHEATH ULTRASOUND LTX NONSTRL (SHEATH) IMPLANT
SUT BONE WAX W31G (SUTURE) IMPLANT
SYR 10ML LL (SYRINGE) ×2 IMPLANT
TOWEL OR 17X26 10 PK STRL BLUE (TOWEL DISPOSABLE) ×2 IMPLANT
UNDERPAD 30X36 HEAVY ABSORB (UNDERPADS AND DIAPERS) ×4 IMPLANT
WATER STERILE IRR 3000ML UROMA (IV SOLUTION) IMPLANT
WATER STERILE IRR 500ML POUR (IV SOLUTION) ×2 IMPLANT
brachysource i-125 seeds IMPLANT

## 2021-12-16 NOTE — Discharge Instructions (Addendum)
DISCHARGE INSTRUCTIONS FOR PROSTATE SEED IMPLANTATION  Antibiotics You may be given a prescription for an antibiotic to take when you arrive home. If so, be sure to take every tablet in the bottle, even if you are feeling better before the prescription is finished. If you begin itching, notice a rash or start to swell on your trunk, arms, legs and/or throat, immediately stop taking the antibiotic and call your Urologist. Diet Resume your usual diet when you return home. To keep your bowels moving easily and softly, drink prune, apple and cranberry juice at room temperature. You may also take a stool softener, such as Colace, which is available without prescription at local pharmacies. Daily activities No driving or heavy lifting for at least two days after the implant. No bike riding, horseback riding or riding lawn mowers for the first month after the implant. Any strenuous physical activity should be approved by your doctor before you resume it. Sexual relations You may resume sexual relations two weeks after the procedure. A condom should be used for the first two weeks. Your semen may be dark brown or black; this is normal and is related bleeding that may have occurred during the implant. Postoperative swelling Expect swelling and bruising of the scrotum and perineum (the area between the scrotum and anus). Both the swelling and the bruising should resolve in l or 2 weeks. Ice packs and over- the-counter medications such as Tylenol, Advil or Aleve may lessen your discomfort. Postoperative urination Most men experience burning on urination and/or urinary frequency. If this becomes bothersome, contact your Urologist.  Medication can be prescribed to relieve these problems.  It is normal to have some blood in your urine for a few days after the implant. Special instructions related to the seeds It is unlikely that you will pass an Iodine-125 seed in your urine. The seeds are silver in color and are  about as large as a grain of rice. If you pass a seed, do not handle it with your fingers. Use a spoon to place it in an envelope or jar in place this in base occluded area such as the garage or basement for return to the radiation clinic at your convenience.  Contact your doctor for Temperature greater than 101 F Increasing pain Inability to urinate Follow-up  You should have follow up with your urologist and radiation oncologist about 3 weeks after the procedure. General information regarding Iodine seeds Iodine-125 is a low energy radioactive material. It is not deeply penetrating and loses energy at short distances. Your prostate will absorb the radiation. Objects that are touched or used by the patient do not become radioactive. Body wastes (urine and stool) or body fluids (saliva, tears, semen or blood) are not radioactive. The Nuclear Regulatory Commission (NRC) has determined that no radiation precautions are needed for patients undergoing Iodine-125 seed implantation. The NRC states that such patients do not present a risk to the people around them, including young children and pregnant women. However, in keeping with the general principle that radiation exposure should be kept as low reasonably possible, we suggest the following: Children and pets should not sit on the patient's lap for the first two (2) weeks after the implant. Pregnant (or possibly pregnant) women should avoid prolonged, close contact with the patient for the first two (2) weeks after the implant. A distance of three (3) feet is acceptable. At a distance of three (3) feet, there is no limit to the length of time anyone can be   with the patient.     Post Anesthesia Home Care Instructions  Activity: Get plenty of rest for the remainder of the day. A responsible individual must stay with you for 24 hours following the procedure.  For the next 24 hours, DO NOT: -Drive a car -Operate machinery -Drink alcoholic  beverages -Take any medication unless instructed by your physician -Make any legal decisions or sign important papers.  Meals: Start with liquid foods such as gelatin or soup. Progress to regular foods as tolerated. Avoid greasy, spicy, heavy foods. If nausea and/or vomiting occur, drink only clear liquids until the nausea and/or vomiting subsides. Call your physician if vomiting continues.  Special Instructions/Symptoms: Your throat may feel dry or sore from the anesthesia or the breathing tube placed in your throat during surgery. If this causes discomfort, gargle with warm salt water. The discomfort should disappear within 24 hours.      

## 2021-12-16 NOTE — Interval H&P Note (Signed)
History and Physical Interval Note:  12/16/2021 12:00 PM  Carlos [name]  has presented today for surgery, with the diagnosis of PROSTATE CANCER.  The various methods of treatment have been discussed with the patient and family. After consideration of risks, benefits and other options for treatment, the patient has consented to  Procedure(s): RADIOACTIVE SEED IMPLANT/BRACHYTHERAPY IMPLANT (N/A) SPACE OAR INSTILLATION (N/A) as a surgical intervention.  The patient's history has been reviewed, patient examined, no change in status, stable for surgery.  I have reviewed the patient's chart and labs.  Questions were answered to the patient's satisfaction.     Ardis Hughs

## 2021-12-16 NOTE — Anesthesia Procedure Notes (Addendum)
Procedure Name: Intubation Date/Time: 12/16/2021 12:27 PM  Performed by: Suan Halter, CRNAPre-anesthesia Checklist: Patient identified, Emergency Drugs available, Suction available and Patient being monitored Patient Re-evaluated:Patient Re-evaluated prior to induction Oxygen Delivery Method: Circle system utilized Preoxygenation: Pre-oxygenation with 100% oxygen Induction Type: IV induction Ventilation: Mask ventilation without difficulty Laryngoscope Size: Glidescope and 4 Grade View: Grade III Tube type: Oral Tube size: 7.5 mm Number of attempts: 1 Airway Equipment and Method: Stylet and Oral airway Placement Confirmation: ETT inserted through vocal cords under direct vision, positive ETCO2 and breath sounds checked- equal and bilateral Secured at: 22 cm Tube secured with: Tape Dental Injury: Teeth and Oropharynx as per pre-operative assessment  Comments: DL x1 with MAC 4  - large tongue, grade 3 view, unable to visualize cords or pass bougie stylet.  Intubated with Glidescope MAC 4

## 2021-12-16 NOTE — Progress Notes (Signed)
  Radiation Oncology         [phone] ________________________________  Name: Carlos [name] MRN: [id number]  Date: 12/16/2021  DOB: 1961/01/22       Prostate Seed Implant  CC:Unk Pinto, MD  No ref. provider found  DIAGNOSIS:   61 y.o. gentleman with Stage T1c adenocarcinoma of the prostate with Gleason score of 3+4, and PSA of 11.3.  Oncology History  Prostate cancer ([place])  05/06/2021 Initial Diagnosis   Prostate cancer ([place])   06/17/2021 Cancer Staging   Staging form: Prostate, AJCC 8th Edition - Clinical stage from 06/17/2021: Stage IIB (cT1c, cN0, cM0, PSA: 11.3, Grade Group: 2) - Signed by Freeman Caldron, PA-C on 09/29/2021 Histopathologic type: Adenocarcinoma, NOS Stage prefix: Initial diagnosis Prostate specific antigen (PSA) range: 10 to 19 Gleason primary pattern: 3 Gleason secondary pattern: 4 Gleason score: 7 Histologic grading system: 5 grade system Number of biopsy cores examined: 12 Number of biopsy cores positive: 5 Location of positive needle core biopsies: Both sides       ICD-10-CM   1. Pre-op testing  Z01.818 CBG per Guidelines for Diabetes Management for Patients Undergoing Surgery ([facility], [facility], and [facility] only)    CBG per protocol    I-Stat, Chem 8 on day of surgery per protocol      PROCEDURE: Insertion of radioactive I-125 seeds into the prostate gland.  RADIATION DOSE: 145 Gy, definitive therapy.  TECHNIQUE: Carlos [name] was brought to the operating room with the urologist. He was placed in the dorsolithotomy position. He was catheterized and a rectal tube was inserted. The perineum was shaved, prepped and draped. The ultrasound probe was then introduced into the rectum to see the prostate gland.  TREATMENT DEVICE: A needle grid was attached to the ultrasound probe stand and anchor needles were placed.  3D PLANNING: The prostate was imaged in 3D using a sagittal sweep of the prostate probe. These images were transferred to the planning computer.  There, the prostate, urethra and rectum were defined on each axial reconstructed image. Then, the software created an optimized 3D plan and a few seed positions were adjusted. The quality of the plan was reviewed using [facility] information for the target and the following two organs at risk:  Urethra and Rectum.  Then the accepted plan was printed and handed off to the radiation therapist.  Under my supervision, the custom loading of the seeds and spacers was carried out and loaded into sealed vicryl sleeves.  These pre-loaded needles were then placed into the needle holder.Marland Kitchen  PROSTATE VOLUME STUDY:  Using transrectal ultrasound the volume of the prostate was verified to be 40 cc.  SPECIAL TREATMENT PROCEDURE/SUPERVISION AND HANDLING: The pre-loaded needles were then delivered under sagittal guidance. A total of 16 needles were used to deposit 62 seeds in the prostate gland. The individual seed activity was 0.469 mCi.  SpaceOAR:  Yes  COMPLEX SIMULATION: At the end of the procedure, an anterior radiograph of the pelvis was obtained to document seed positioning and count. Cystoscopy was performed to check the urethra and bladder.  MICRODOSIMETRY: At the end of the procedure, the patient was emitting 0.04 mR/hr at 1 meter. Accordingly, he was considered safe for hospital discharge.  PLAN: The patient will return to the radiation oncology clinic for post implant CT dosimetry in three weeks.   ________________________________  Sheral Apley Tammi Klippel, M.D.

## 2021-12-16 NOTE — H&P (Signed)
Prostate cancer profile  Stage: T1c  PSA: 9.8  Biopsy 06/17/2021, 5/12 cores positive: 3+4 = 7 right lateral base and left apex  Prostate volume: 40 g   Minimal voiding symptoms.   Interval 11/30/21:  Patient returns today for pre-operative appointment for radioactive seed placement. He is doing well with no complaints, eager to get surgery over with. No blood thinners or cardiac symptoms.     ALLERGIES: No Allergies    MEDICATIONS: Aspirin Ec 81 mg tablet, delayed release Oral  Lantus  Lisinopril 30 mg tablet Oral  Metformin Hcl 1,000 mg tablet Oral  Ozempic  Sildenafil Citrate     GU PSH: ESWL - 2016 Prostate Needle Biopsy - 06/17/2021 Ureteroscopic stone removal - 2016       PSH Notes: Cystoscopy With Ureteroscopy With Removal Of Calculus, Lithotripsy, Rhinoplasty   NON-GU PSH: Nose Surgery (Unspecified) Reconstruct Nose - 2016 Surgical Pathology, Gross And Microscopic Examination For Prostate Needle - 06/17/2021     GU PMH: Prostate Cancer - 08/24/2021, - 06/24/2021 Elevated PSA - 06/17/2021, - 03/23/2021, - 12/25/2020, - 08/31/2020 Ureteral calculus, Calculus of left ureter - 2016 Renal calculus, Nephrolithiasis - 2016    NON-GU PMH: Encounter for general adult medical examination without abnormal findings, Encounter for preventive health examination - 2016 Obstructive sleep apnea (adult) (pediatric), Obstructive sleep apnea, adult - 2016 Diabetes Type 2 Sleep Apnea    FAMILY HISTORY: cardiac disorder - Runs In Family chronic renal failure syndrome - Runs In Family Diabetes - Father   SOCIAL HISTORY: Marital Status: Married Preferred Language: English; Ethnicity: Not Hispanic Or Latino; Race: Black or African American Current Smoking Status: Patient has never smoked.   Tobacco Use Assessment Completed: Used Tobacco in last 30 days? Social Drinker.  Drinks 2 caffeinated drinks per day.     Notes: Never a smoker, Alcohol use, Occupation, Caffeine use, Married,  Non-smoker, Number of children   VITAL SIGNS:      11/30/2021 02:34 PM  Weight 290 lb / 131.54 kg  Height 74 in / 187.96 cm  BMI 37.2 kg/m   MULTI-SYSTEM PHYSICAL EXAMINATION:    Constitutional: Well-nourished. No physical deformities. Normally developed. Good grooming.  Neck: Neck symmetrical, not swollen. Normal tracheal position.  Respiratory: No labored breathing, no use of accessory muscles.   Skin: No paleness, no jaundice, no cyanosis. No lesion, no ulcer, no rash.  Gastrointestinal: No mass, no tenderness, no rigidity, obese abdomen.   Musculoskeletal: Normal gait and station of head and neck.     Complexity of Data:  Source Of History:  Patient, Healthcare Provider  Lab Test Review:   BMP, CBC with Diff, Path Report  Urine Test Review:   Urinalysis  X-Ray Review: MRI Prostate [facility]: Reviewed Report. Discussed With Patient.     03/16/21 12/01/20 08/31/20  PSA  Total PSA 9.86 ng/mL 9.23 ng/mL 11.30 ng/mL  Free PSA 0.99 ng/mL    % Free PSA 10 % PSA      PROCEDURES:          Urinalysis w/Scope Dipstick Dipstick Cont'd Micro  Color: Yellow Bilirubin: Neg mg/dL WBC/hpf: 0 - 5/hpf  Appearance: Clear Ketones: Neg mg/dL RBC/hpf: NS (Not Seen)  Specific Gravity: 1.025 Blood: Neg ery/uL Bacteria: Rare (0-9/hpf)  pH: <=5.0 Protein: 1+ mg/dL Cystals: NS (Not Seen)  Glucose: 2+ mg/dL Urobilinogen: 0.2 mg/dL Casts: Hyaline    Nitrites: Neg Trichomonas: Not Present    Leukocyte Esterase: Neg leu/uL Mucous: Not Present      Epithelial Cells:  0 - 5/hpf      Yeast: NS (Not Seen)      Sperm: Not Present    ASSESSMENT:      ICD-10 Details  1 GU:   Prostate Cancer - C61           Notes:   Discussed risks/benefits of seed placement including but not limited to bleeding, infection, damage to surrounding structures. UA without signs of infection today. All questions were answered and pre-operative instructions were discussed.

## 2021-12-16 NOTE — Transfer of Care (Signed)
Immediate Anesthesia Transfer of Care Note  Patient: Carlos [name]  Procedure(s) Performed: Procedure(s) (LRB): RADIOACTIVE SEED IMPLANT/BRACHYTHERAPY IMPLANT (N/A) SPACE OAR INSTILLATION (N/A)  Patient Location: PACU  Anesthesia Type: General  Level of Consciousness: awake, oriented, sedated and patient cooperative  Airway & Oxygen Therapy: Patient Spontanous Breathing and Patient connected to face mask oxygen  Post-op Assessment: Report given to PACU RN and Post -op Vital signs reviewed and stable  Post vital signs: Reviewed and stable  Complications: No apparent anesthesia complications  Last Vitals:  Vitals Value Taken Time  BP 156/90 12/16/21 1340  Temp 36.4 C 12/16/21 1340  Pulse 86 12/16/21 1342  Resp 20 12/16/21 1342  SpO2 100 % 12/16/21 1342  Vitals shown include unvalidated device data.  Last Pain:  Vitals:   12/16/21 1025  TempSrc: Oral  PainSc: 0-No pain      Patients Stated Pain Goal: 8 (74/82/70 7867)  Complications: No notable events documented.

## 2021-12-16 NOTE — Op Note (Signed)
Preoperative diagnosis:  Clinical stage TI C adenocarcinoma the prostate  Postoperative diagnosis:  Same  Procedure:  #1 I-125 prostate seed implantation  #2 cystourethroscopy #3 instillation of SpaceOAR biogel  Surgeon: Louis Meckel, M.D. Radiation Oncologist: Tyler Pita, M.D.  Anesthesia: Gen.   Indications: Patient  was diagnosed with clinical stage TI C prostate cancer. We had extensive discussion with him about treatment options versus. He elected to proceed with seed implantation. He underwent consultation my office as well as with Dr. Tyler Pita. He appeared to understand the advantages disadvantages potential risks of this treatment option. Full informed consent has been obtained. The patient is had preoperative ciprofloxacin. PAS compression boots were placed.   Technique and findings: Patient was brought the operating room where he had  successful induction of general anesthesia. He was placed in lithotomy position and prepped and draped in usual manner. Appropriate surgical timeout was performed. Radiation oncology department placed a transrectal ultrasound probe anchoring stand. Foley catheter with contrast in the balloon was inserted without difficulty. Anchoring needles were placed within the prostate.  Real-time contouring of the urethra prostate and rectum were performed and the dosing parameters were established. Targeted dose was 145 gray. We then came to the operating suite suite for placement of the needles. A second timeout was performed. All needle passage was done with real-time transrectal ultrasound guidance with the sagittal plane. A total of 16 needles were placed.  62 active seeds were implanted.  The brachytherapy template was then removed.    A site in the midline was selected on the perineum for placement of an 18 g needle with saline.  The needle was advanced above the rectum and below Denonvillier's fascia to the mid gland and confirmed to be in the  midline on transverse imaging.  One cc of saline was injected confirming appropriate expansion of this space.  A total of 5 cc of saline was then injected to open the space further bilaterally.  The saline syringe was then removed and the SpaceOAR hydrogel was injected with good distribution bilaterally.A Foley catheter was removed and flexible cystoscopy failed to show any seeds outside the prostate.

## 2021-12-16 NOTE — Anesthesia Preprocedure Evaluation (Addendum)
Anesthesia Evaluation  Patient identified by MRN, date of birth, ID band Patient awake    Reviewed: Allergy & Precautions, NPO status , Patient's Chart, lab work & pertinent test results  History of Anesthesia Complications Negative for: history of anesthetic complications  Airway Mallampati: II  TM Distance: >3 FB Neck ROM: Full    Dental  (+) Teeth Intact, Dental Advisory Given   Pulmonary sleep apnea ,    breath sounds clear to auscultation       Cardiovascular hypertension, (-) angina(-) Past MI and (-) CHF  Rhythm:Regular     Neuro/Psych    GI/Hepatic negative GI ROS, Neg liver ROS,   Endo/Other  diabetes, Type 2Lab Results      Component                Value               Date                      HGBA1C                   9.0 (H)             05/06/2021           glp-1 last dose 'Sunday, denies symptoms  Renal/GU CRFRenal diseaseLab Results      Component                Value               Date                      CREATININE               1.58 (H)            05/06/2021           Lab Results      Component                Value               Date                      K                        4.0                 05/06/2021                Musculoskeletal   Abdominal   Peds  Hematology negative hematology ROS (+) Lab Results      Component                Value               Date                      WBC                      5.7                 05/06/2021                HGB                      16'$ .0  05/06/2021                HCT                      46.8                05/06/2021                MCV                      85.1                05/06/2021                PLT                      233                 05/06/2021              Anesthesia Other Findings   Reproductive/Obstetrics                            Anesthesia Physical Anesthesia Plan  ASA: 3  Anesthesia  Plan: General   Post-op Pain Management: Ofirmev IV (intra-op)*   Induction: Intravenous, Rapid sequence and Cricoid pressure planned  PONV Risk Score and Plan: 2 and Ondansetron and Dexamethasone  Airway Management Planned: Oral ETT  Additional Equipment: None  Intra-op Plan:   Post-operative Plan: Extubation in OR  Informed Consent: I have reviewed the patients History and Physical, chart, labs and discussed the procedure including the risks, benefits and alternatives for the proposed anesthesia with the patient or authorized representative who has indicated his/her understanding and acceptance.     Dental advisory given  Plan Discussed with: CRNA  Anesthesia Plan Comments:        Anesthesia Quick Evaluation

## 2021-12-17 ENCOUNTER — Encounter (HOSPITAL_BASED_OUTPATIENT_CLINIC_OR_DEPARTMENT_OTHER): Payer: Self-pay | Admitting: Urology

## 2021-12-17 NOTE — Anesthesia Postprocedure Evaluation (Signed)
Anesthesia Post Note  Patient: Carlos [name]  Procedure(s) Performed: RADIOACTIVE SEED IMPLANT/BRACHYTHERAPY IMPLANT (Prostate) SPACE OAR INSTILLATION (Perineum)     Patient location during evaluation: PACU Anesthesia Type: General Level of consciousness: awake and alert Pain management: pain level controlled Vital Signs Assessment: post-procedure vital signs reviewed and stable Respiratory status: spontaneous breathing, nonlabored ventilation and respiratory function stable Cardiovascular status: blood pressure returned to baseline and stable Postop Assessment: no apparent nausea or vomiting Anesthetic complications: no   No notable events documented.  Last Vitals:  Vitals:   12/16/21 1400 12/16/21 1447  BP: (!) 154/80 135/86  Pulse: 79 70  Resp: 12 16  Temp:  36.7 C  SpO2: 99% 100%    Last Pain:  Vitals:   12/16/21 1415  TempSrc:   PainSc: 0-No pain                 Yashika Mask

## 2021-12-28 ENCOUNTER — Telehealth: Payer: Self-pay | Admitting: *Deleted

## 2021-12-28 NOTE — Telephone Encounter (Signed)
CALLED PATIENT TO REMIND OF POST SEED APPTS. FOR 12-29-21, SPOKE WITH PATIENT AND HE IS AWARE OF THESE APPTS.

## 2021-12-29 ENCOUNTER — Ambulatory Visit
Admission: RE | Admit: 2021-12-29 | Discharge: 2021-12-29 | Disposition: A | Discharge status: Home / Self Care | Source: Ambulatory Visit | Attending: Radiation Oncology | Admitting: Radiation Oncology

## 2021-12-29 ENCOUNTER — Ambulatory Visit
Admission: RE | Admit: 2021-12-29 | Discharge: 2021-12-29 | Disposition: A | Discharge status: Home / Self Care | Source: Ambulatory Visit | Attending: Urology | Admitting: Urology

## 2021-12-29 ENCOUNTER — Encounter: Payer: Self-pay | Admitting: Urology

## 2021-12-29 VITALS — BP 136/91 | HR 78 | Temp 97.7°F | Resp 20 | Ht 74.0 in | Wt 308.2 lb

## 2021-12-29 DIAGNOSIS — C61 Malignant neoplasm of prostate: Secondary | ICD-10-CM | POA: Insufficient documentation

## 2021-12-29 NOTE — Progress Notes (Signed)
Telephone appointment. I verified patient identity and began nursing interview. Patient is doing very well. No issues reported at this time.  Meaningful use complete. I-PSS score of 8-mild. Flomax-STOPPED by patient. Urology appt- Jan 06, 2022  BP (!) 136/91 (BP Location: Left Arm, Patient Position: Sitting, Cuff Size: Large)   Pulse 78   Temp 97.7 F (36.5 C) (Temporal)   Resp 20   Ht '6\' 2"'$  (1.88 m)   Wt (!) 308 lb 3.2 oz (139.8 kg)   SpO2 100%   BMI 39.57 kg/m

## 2021-12-29 NOTE — Progress Notes (Signed)
Radiation Oncology         [phone] ________________________________  Name: SHAKEEL DISNEY MRN: [id number]  Date: 12/29/2021  DOB: 09-Dec-1960  Post-Seed Follow-Up Visit Note  CC: Unk Pinto, MD  Ardis Hughs, MD  Diagnosis:   61 y.o. gentleman with Stage T1c adenocarcinoma of the prostate with Gleason score of 3+4, and PSA of 11.3.    ICD-10-CM   1. Prostate cancer ([place])  C61       Interval Since Last Radiation:  2 weeks 12/16/21:  Insertion of radioactive I-125 seeds into the prostate gland; 145 Gy, definitive therapy with placement of SpaceOAR gel.  Narrative:  The patient returns today for routine follow-up.  He is complaining of increased urinary frequency and urinary hesitation symptoms. He filled out a questionnaire regarding urinary function today providing and overall IPSS score of 8 characterizing his symptoms as mild with frequency, intermittency and a weaker flow of stream.  His pre-implant score was 2.  He specifically denies dysuria, gross hematuria, straining to void, incomplete bladder emptying or incontinence.  He denies any abdominal pain or bowel symptoms.  He reports a healthy appetite and is maintaining his weight.  He has not noticed any significant impact on his energy level and overall, is quite pleased with his progress to date.  ALLERGIES:  has No Known Allergies.  Meds: Current Outpatient Medications  Medication Sig Dispense Refill   aspirin EC 81 MG tablet Take 81 mg by mouth daily.     Cholecalciferol (VITAMIN D-3) 125 MCG (5000 UT) TABS Take 15,000 Units by mouth daily. (Patient taking differently: Take 1 capsule by mouth 2 (two) times daily.) 30 tablet 0   CINNAMON PO Take 2,000 mg by mouth 2 (two) times daily. Takes 2 tablets twice a day.     glimepiride (AMARYL) 4 MG tablet Take 1 tablet 2 x /day with Meals for Diabetes (Patient taking differently: Take 4 mg by mouth 2 (two) times daily. Take 1 tablet 2 x /day with Meals for Diabetes) 180  tablet 1   glucose blood (FREESTYLE LITE) test strip CHECK BLOOD SUGAR 1 TIME A DAY-dx-E11.29. 100 each 12   glucose monitoring kit (FREESTYLE) monitoring kit Check blood sugar 1 time a day-DX-E11.29 1 each 0   Lancets (FREESTYLE) lancets CHECK BLOOD SUGAR 1 TIME A DAY-DX-E11.29. 100 each 12   LANTUS SOLOSTAR 100 UNIT/ML Solostar Pen INJECT 30 TO 40 UNITS DAILY FOR DIABETES (Patient taking differently: Inject 40 Units into the skin daily. INJECT 30 TO 40 UNITS DAILY FOR DIABETES) 30 mL 3   lisinopril (ZESTRIL) 20 MG tablet Take  1 tablet  Daily  for BP (Patient taking differently: Take 20 mg by mouth daily. Take  1 tablet  Daily  for BP) 90 tablet 3   metFORMIN (GLUCOPHAGE-XR) 500 MG 24 hr tablet TAKE 2 TABLETS TWICE A DAY WITH MEALS FOR DIABETES (Patient taking differently: Take 1,000 mg by mouth 2 (two) times daily. TAKE 2 TABLETS TWICE A DAY WITH MEALS FOR DIABETES) 360 tablet 3   OZEMPIC, 1 MG/DOSE, 4 MG/3ML SOPN INJECT 1 MG UNDER THE SKIN ONCE WEEKLY (Patient taking differently: once a week. Sunday's) 6 mL 5   sildenafil (VIAGRA) 100 MG tablet Take  1/2 to 1 tablet  Daily  if needed for XXXX (Patient taking differently: Take  1/2 to 1 tablet  Daily  if needed for XXXX) 30 tablet 3   tamsulosin (FLOMAX) 0.4 MG CAPS capsule Take 1 capsule (0.4 mg total) by  mouth daily. 30 capsule 5   traMADol (ULTRAM) 50 MG tablet Take 1-2 tablets (50-100 mg total) by mouth every 6 (six) hours as needed for moderate pain. 15 tablet 0   No current facility-administered medications for this visit.    Physical Findings: In general this is a well appearing African-American male in no acute distress. He's alert and oriented x4 and appropriate throughout the examination. Cardiopulmonary assessment is negative for acute distress and he exhibits normal effort.   Lab Findings: Lab Results  Component Value Date   WBC 5.7 05/06/2021   HGB 15.0 12/16/2021   HCT 44.0 12/16/2021   MCV 85.1 05/06/2021   PLT 233  05/06/2021    Radiographic Findings:  Patient underwent CT imaging in our clinic for post implant dosimetry. The CT will be reviewed by Dr. Tammi Klippel to confirm there is an adequate distribution of radioactive seeds throughout the prostate gland and ensure that there are no seeds in or near the rectum.  We suspect the final radiation plan and dosimetry will show appropriate coverage of the prostate gland. He understands that we will call and inform him of any unexpected findings on further review of his imaging and dosimetry.  Impression/Plan: 61 y.o. gentleman with Stage T1c adenocarcinoma of the prostate with Gleason score of 3+4, and PSA of 11.3. The patient is recovering from the effects of radiation. His urinary symptoms should gradually improve over the next 4-6 months. We talked about this today. He is encouraged by his improvement already and is otherwise pleased with his outcome. We also talked about long-term follow-up for prostate cancer following seed implant. He understands that ongoing PSA determinations and digital rectal exams will help perform surveillance to rule out disease recurrence. He has a follow up appointment scheduled with Dr. Louis Meckel on 01/06/22. He understands what to expect with his PSA measures. Patient was also educated today about some of the long-term effects from radiation including a small risk for rectal bleeding and possibly erectile dysfunction. We talked about some of the general management approaches to these potential complications. However, I did encourage the patient to contact our office or return at any point if he has questions or concerns related to his previous radiation and prostate cancer.    Nicholos Johns, PA-C

## 2021-12-29 NOTE — Progress Notes (Signed)
  Radiation Oncology         [phone] ________________________________  Name: Carlos [name] MRN: [id number]  Date: 12/29/2021  DOB: 10-28-1960  COMPLEX SIMULATION NOTE  NARRATIVE:  The patient was brought to the [facility] today following prostate seed implantation approximately one month ago.  Identity was confirmed.  All relevant records and images related to the planned course of therapy were reviewed.  Then, the patient was set-up supine.  CT images were obtained.  The CT images were loaded into the planning software.  Then the prostate and rectum were contoured.  Treatment planning then occurred.  The implanted iodine 125 seeds were identified by the physics staff for projection of radiation distribution  I have requested : 3D Simulation  I have requested a DVH of the following structures: Prostate and rectum.    ________________________________  Sheral Apley Tammi Klippel, M.D.

## 2022-01-04 ENCOUNTER — Encounter: Payer: Self-pay | Admitting: Radiation Oncology

## 2022-01-04 ENCOUNTER — Ambulatory Visit
Admission: RE | Admit: 2022-01-04 | Discharge: 2022-01-04 | Disposition: A | Discharge status: Home / Self Care | Source: Ambulatory Visit | Attending: Radiation Oncology | Admitting: Radiation Oncology

## 2022-01-04 DIAGNOSIS — C61 Malignant neoplasm of prostate: Secondary | ICD-10-CM | POA: Insufficient documentation

## 2022-01-04 NOTE — Progress Notes (Signed)
  Radiation Oncology         [phone] ________________________________  Name: Carlos [name] MRN: [id number]  Date: 01/04/2022  DOB: Sep 17, 1960  3D Planning Note   Prostate Brachytherapy Post-Implant Dosimetry  Diagnosis: 62 y.o. gentleman with Stage T1c adenocarcinoma of the prostate with Gleason score of 3+4, and PSA of 11.3.  Narrative: On a previous date, [name] returned following prostate seed implantation for post implant planning. He underwent CT scan complex simulation to delineate the three-dimensional structures of the pelvis and demonstrate the radiation distribution.  Since that time, the seed localization, and complex isodose planning with dose volume histograms have now been completed.  Results:   Prostate Coverage - The dose of radiation delivered to the 90% or more of the prostate gland (D90) was 100.3% of the prescription dose. This exceeds our goal of greater than 90%. Rectal Sparing - The volume of rectal tissue receiving the prescription dose or higher was 0.0 cc. This falls under our thresholds tolerance of 1.0 cc.  Impression: The prostate seed implant appears to show adequate target coverage and appropriate rectal sparing.  Plan:  The patient will continue to follow with urology for ongoing PSA determinations. I would anticipate a high likelihood for local tumor control with minimal risk for rectal morbidity.  ________________________________  Sheral Apley Tammi Klippel, M.D.

## 2022-01-31 ENCOUNTER — Encounter: Payer: Self-pay | Admitting: *Deleted

## 2022-02-18 ENCOUNTER — Telehealth: Payer: Self-pay | Admitting: *Deleted

## 2022-02-18 ENCOUNTER — Encounter: Payer: Self-pay | Admitting: *Deleted

## 2022-02-21 ENCOUNTER — Encounter: Payer: Self-pay | Admitting: Internal Medicine

## 2022-04-12 ENCOUNTER — Encounter: Payer: Self-pay | Admitting: *Deleted

## 2022-04-28 ENCOUNTER — Other Ambulatory Visit: Payer: Self-pay | Admitting: Nurse Practitioner

## 2022-04-29 ENCOUNTER — Encounter: Payer: Self-pay | Admitting: Family

## 2022-04-29 NOTE — Telephone Encounter (Signed)
He is scheduled to see you on 05/10/22.  It appears his insurance will no longer cover Ozempic and want him to switch to Trulicity So I thought you could discuss dosage and switching at his appointment

## 2022-05-03 ENCOUNTER — Other Ambulatory Visit: Payer: Self-pay

## 2022-05-03 ENCOUNTER — Encounter: Payer: Self-pay | Admitting: Nurse Practitioner

## 2022-05-03 DIAGNOSIS — E1122 Type 2 diabetes mellitus with diabetic chronic kidney disease: Secondary | ICD-10-CM

## 2022-05-03 MED ORDER — FREESTYLE LANCETS MISC: 12 refills | Status: AC

## 2022-05-10 ENCOUNTER — Encounter: Admitting: Nurse Practitioner

## 2022-05-12 ENCOUNTER — Emergency Department (HOSPITAL_BASED_OUTPATIENT_CLINIC_OR_DEPARTMENT_OTHER)

## 2022-05-12 ENCOUNTER — Encounter (HOSPITAL_BASED_OUTPATIENT_CLINIC_OR_DEPARTMENT_OTHER): Payer: Self-pay | Admitting: Emergency Medicine

## 2022-05-12 ENCOUNTER — Other Ambulatory Visit: Payer: Self-pay

## 2022-05-12 ENCOUNTER — Emergency Department (HOSPITAL_BASED_OUTPATIENT_CLINIC_OR_DEPARTMENT_OTHER)
Admission: EM | Admit: 2022-05-12 | Discharge: 2022-05-12 | Disposition: A | Discharge status: Home / Self Care | Attending: Emergency Medicine | Admitting: Emergency Medicine

## 2022-05-12 DIAGNOSIS — Z7982 Long term (current) use of aspirin: Secondary | ICD-10-CM | POA: Diagnosis not present

## 2022-05-12 DIAGNOSIS — Z7984 Long term (current) use of oral hypoglycemic drugs: Secondary | ICD-10-CM | POA: Diagnosis not present

## 2022-05-12 DIAGNOSIS — E1122 Type 2 diabetes mellitus with diabetic chronic kidney disease: Secondary | ICD-10-CM | POA: Diagnosis not present

## 2022-05-12 DIAGNOSIS — Z8546 Personal history of malignant neoplasm of prostate: Secondary | ICD-10-CM | POA: Diagnosis not present

## 2022-05-12 DIAGNOSIS — N183 Chronic kidney disease, stage 3 unspecified: Secondary | ICD-10-CM | POA: Diagnosis not present

## 2022-05-12 DIAGNOSIS — I129 Hypertensive chronic kidney disease with stage 1 through stage 4 chronic kidney disease, or unspecified chronic kidney disease: Secondary | ICD-10-CM | POA: Diagnosis not present

## 2022-05-12 DIAGNOSIS — U071 COVID-19: Secondary | ICD-10-CM | POA: Diagnosis not present

## 2022-05-12 DIAGNOSIS — J111 Influenza due to unidentified influenza virus with other respiratory manifestations: Secondary | ICD-10-CM

## 2022-05-12 DIAGNOSIS — Z79899 Other long term (current) drug therapy: Secondary | ICD-10-CM | POA: Insufficient documentation

## 2022-05-12 DIAGNOSIS — J029 Acute pharyngitis, unspecified: Secondary | ICD-10-CM | POA: Diagnosis present

## 2022-05-12 DIAGNOSIS — Z794 Long term (current) use of insulin: Secondary | ICD-10-CM | POA: Diagnosis not present

## 2022-05-12 LAB — RESP PANEL BY RT-PCR (RSV, FLU A&B, COVID)  RVPGX2
Influenza A by PCR: NEGATIVE
Influenza B by PCR: NEGATIVE
Resp Syncytial Virus by PCR: NEGATIVE
SARS Coronavirus 2 by RT PCR: POSITIVE — AB

## 2022-05-12 LAB — GROUP A STREP BY PCR: Group A Strep by PCR: DETECTED — AB

## 2022-05-12 MED ORDER — PENICILLIN G BENZATHINE 1200000 UNIT/2ML IM SUSY
1.2000 10*6.[IU] | PREFILLED_SYRINGE | Freq: Once | INTRAMUSCULAR | Status: AC
  Administered 2022-05-12: 1.2 10*6.[IU] via INTRAMUSCULAR
  Filled 2022-05-12: qty 2

## 2022-05-12 MED ORDER — NIRMATRELVIR/RITONAVIR (PAXLOVID) TABLET (RENAL DOSING): 2.0000 | ORAL_TABLET | Freq: Two times a day (BID) | ORAL | 0 refills | Status: AC

## 2022-05-12 NOTE — ED Provider Notes (Signed)
[facility] EMERGENCY DEPT Provider Note   CSN: [id number] Arrival date & time: 05/12/22  1537     History  Chief Complaint  Patient presents with   Sore Throat    Carlos [name] is a 62 y.o. male with a pmh of hypertension, CKD, type 2 diabetes presenting today for evaluation of sore throat. Patient reports he has had sore throat in the last 2 days. He denies any fever, cough, runny nose. Endorses shortness of breath. No known sick contact. States he has tried Ambulance person. Hx of prostate cancer not currently receiving treatment. Hx of hypertension not compliant with his medication.   Sore Throat    Past Medical History:  Diagnosis Date   Chronic kidney disease (CKD), stage III (moderate) (HCC)    Erectile dysfunction 09/11/2014   Hereditary hemochromatosis ([place])    previously followed by dr Marin Olp (hemaology) , H63D gene mutation,   hx phlebotomies and / or donate blood   History of kidney stones    Hyperlipidemia    Hypertension    Hypogonadism male    Malignant neoplasm prostate [facility]) 06/2021   urologist-- dr herrick/  radiation oncology-- dr Tammi Klippel---  dx 02/ 2023  Gleason 3+4   Neuropathy    OSA on CPAP    pt stated uses nightly   Right bundle branch block    Type 2 diabetes mellitus treated with insulin ([place])    followed by pcp   (12-13-2021  pt stated checks blood sugar daily fasting, average 120-130)   Past Surgical History:  Procedure Laterality Date   ANKLE SURGERY Left 2002   COLONOSCOPY  2018   CYSTOSCOPY W/ RETROGRADES Left 12/05/2014   Procedure: CYSTOSCOPY WITH RETROGRADE PYELOGRAM;  Surgeon: Ardis Hughs, MD;  Location: [facility];  Service: Urology;  Laterality: Left;   CYSTOSCOPY/RETROGRADE/URETEROSCOPY/STONE EXTRACTION WITH BASKET Left 12/05/2014   Procedure: LEFT  URETEROSCOPY, STONE EXTRACTION ;  Surgeon: Ardis Hughs, MD;  Location: [facility];  Service: Urology;  Laterality: Left;    EXTRACORPOREAL SHOCK WAVE LITHOTRIPSY Left 09-25-2014   RADIOACTIVE SEED IMPLANT N/A 12/16/2021   Procedure: RADIOACTIVE SEED IMPLANT/BRACHYTHERAPY IMPLANT;  Surgeon: Ardis Hughs, MD;  Location: [facility];  Service: Urology;  Laterality: N/A;   RHINOPLASTY  2001   SPACE OAR INSTILLATION N/A 12/16/2021   Procedure: SPACE OAR INSTILLATION;  Surgeon: Ardis Hughs, MD;  Location: [facility];  Service: Urology;  Laterality: N/A;     Home Medications Prior to Admission medications   Medication Sig Start Date End Date Taking? Authorizing Provider  nirmatrelvir/ritonavir, renal dosing, (PAXLOVID) 10 x 150 MG & 10 x '100MG'$  TABS Take 2 tablets by mouth 2 (two) times daily for 5 days. Patient GFR is 54. Take nirmatrelvir (150 mg) one tablet twice daily for 5 days and ritonavir (100 mg) one tablet twice daily for 5 days. 05/12/22 05/17/22 Yes Rex Kras, PA  aspirin EC 81 MG tablet Take 81 mg by mouth daily.    [provider]  Cholecalciferol (VITAMIN D-3) 125 MCG (5000 UT) TABS Take 15,000 Units by mouth daily. Patient taking differently: Take 1 capsule by mouth 2 (two) times daily. 09/13/18   Liane Comber, NP  CINNAMON PO Take 2,000 mg by mouth 2 (two) times daily. Takes 2 tablets twice a day.    [provider]  glimepiride (AMARYL) 4 MG tablet Take 1 tablet 2 x /day with Meals for Diabetes Patient taking differently: Take 4 mg  by mouth 2 (two) times daily. Take 1 tablet 2 x /day with Meals for Diabetes 08/09/19   Unk Pinto, MD  glucose blood (FREESTYLE LITE) test strip CHECK BLOOD SUGAR 1 TIME A DAY-dx-E11.29. 11/26/18   Unk Pinto, MD  glucose monitoring kit (FREESTYLE) monitoring kit Check blood sugar 1 time a day-DX-E11.29 11/26/18   Unk Pinto, MD  Lancets (FREESTYLE) lancets CHECK BLOOD SUGAR 1 TIME A DAY-DX-E11.29. 05/03/22   Cranford, Kenney Houseman, NP  LANTUS SOLOSTAR 100 UNIT/ML Solostar Pen INJECT 30 TO 40 UNITS DAILY FOR  DIABETES Patient taking differently: Inject 40 Units into the skin daily. INJECT 30 TO 40 UNITS DAILY FOR DIABETES 06/10/21   Alycia Rossetti, NP  lisinopril (ZESTRIL) 20 MG tablet Take  1 tablet  Daily  for BP Patient taking differently: Take 20 mg by mouth daily. Take  1 tablet  Daily  for BP 11/14/21   Unk Pinto, MD  metFORMIN (GLUCOPHAGE-XR) 500 MG 24 hr tablet TAKE 2 TABLETS TWICE A DAY WITH MEALS FOR DIABETES Patient taking differently: Take 1,000 mg by mouth 2 (two) times daily. TAKE 2 TABLETS TWICE A DAY WITH MEALS FOR DIABETES 09/11/20   Unk Pinto, MD  OZEMPIC, 1 MG/DOSE, 4 MG/3ML SOPN INJECT 1 MG UNDER THE SKIN ONCE WEEKLY Patient taking differently: once a week. Sunday's 06/10/21   Alycia Rossetti, NP  sildenafil (VIAGRA) 100 MG tablet Take  1/2 to 1 tablet  Daily  if needed for XXXX Patient taking differently: Take  1/2 to 1 tablet  Daily  if needed for XXXX 11/14/21   Unk Pinto, MD  tamsulosin (FLOMAX) 0.4 MG CAPS capsule Take 1 capsule (0.4 mg total) by mouth daily. Patient not taking: Reported on 12/29/2021 12/16/21   Ardis Hughs, MD  traMADol (ULTRAM) 50 MG tablet Take 1-2 tablets (50-100 mg total) by mouth every 6 (six) hours as needed for moderate pain. 12/16/21   Ardis Hughs, MD      Allergies    Patient has no known allergies.    Review of Systems   Review of Systems Negative except as per HPI.  Physical Exam Updated Vital Signs BP (!) 179/96   Pulse (!) 103   Temp 98.5 F (36.9 C) (Oral)   Resp 16   Ht '6\' 2"'$  (1.88 m)   Wt 131.5 kg   SpO2 97%   BMI 37.23 kg/m  Physical Exam Vitals and nursing note reviewed.  Constitutional:      Appearance: Normal appearance.  HENT:     Head: Normocephalic and atraumatic.     Mouth/Throat:     Mouth: Mucous membranes are moist.     Comments: Posterior oropharyngeal erythema. Eyes:     General: No scleral icterus. Cardiovascular:     Rate and Rhythm: Normal rate and regular rhythm.      Pulses: Normal pulses.     Heart sounds: Normal heart sounds.  Pulmonary:     Effort: Pulmonary effort is normal.     Breath sounds: Normal breath sounds.  Abdominal:     General: Abdomen is flat.     Palpations: Abdomen is soft.     Tenderness: There is no abdominal tenderness.  Musculoskeletal:        General: No deformity.  Skin:    General: Skin is warm.     Findings: No rash.  Neurological:     General: No focal deficit present.     Mental Status: He is alert.  Psychiatric:  Mood and Affect: Mood normal.     ED Results / Procedures / Treatments   Labs (all labs ordered are listed, but only abnormal results are displayed) Labs Reviewed  RESP PANEL BY RT-PCR (RSV, FLU A&B, COVID)  RVPGX2 - Abnormal; Notable for the following components:      Result Value   SARS Coronavirus 2 by RT PCR POSITIVE (*)    All other components within normal limits  GROUP A STREP BY PCR - Abnormal; Notable for the following components:   Group A Strep by PCR DETECTED (*)    All other components within normal limits    EKG None  Radiology DG Chest Portable 1 View  Result Date: 05/12/2022 CLINICAL DATA:  Three day history of sore throat EXAM: PORTABLE CHEST 1 VIEW COMPARISON:  None Available. FINDINGS: Low lung volumes. No focal consolidations. No pneumothorax. Left apical pleural thickening. The heart size and mediastinal contours are within normal limits. The visualized skeletal structures are unremarkable. IMPRESSION: Low lung volumes. No focal consolidations. Electronically Signed   By: Darrin Nipper M.D.   On: 05/12/2022 16:27    Procedures Procedures    Medications Ordered in ED Medications  penicillin g benzathine (BICILLIN LA) 1200000 UNIT/2ML injection 1.2 Million Units (1.2 Million Units Intramuscular Given 05/12/22 1653)    ED Course/ Medical Decision Making/ A&P                           Medical Decision Making Amount and/or Complexity of Data Reviewed Radiology:  ordered.  Risk Prescription drug management.   This patient presents to the ED for sore throat, body aches, this involves an extensive number of treatment options, and is a complaint that carries with a high risk of complications and morbidity.  The differential diagnosis includes flu, COVID, RSV, strep, pharyngitis, bronchitis, pneumonia, infectious etiology.  This is not an exhaustive list.  Lab tests: I ordered and personally interpreted labs.  The pertinent results include: Viral panel positive for Covid.  Strep positive.  Imaging studies:  Problem list/ ED course/ Critical interventions/ Medical management: HPI: See above Vital signs within normal range and stable throughout visit. Laboratory/imaging studies significant for: See above. On physical examination, patient is afebrile and appears in no acute distress. This patient presents with symptoms suspicious for COVID and strep pharyngitis. Based on history and physical doubt sinusitis. COVID test was positive. Do not suspect underlying cardiopulmonary process. I considered, but think unlikely, pneumonia. Patient is nontoxic appearing and not in need of emergent medical intervention. Patient told to self isolate at home until symptoms subside for 72 hours.  Given symptoms duration, I will give patient Tamiflu.  I also sent an Rx of Paxlovid renal dosing as patient has GFR of 54.  Recommended patient to take TheraFlu or Mucinex for symptom relief.  Follow-up with primary care physician for further evaluation and management.  Return to the ER if new or worsening symptoms. I have reviewed the patient home medicines and have made adjustments as needed.  Cardiac monitoring/EKG: The patient was maintained on a cardiac monitor.  I personally reviewed and interpreted the cardiac monitor which showed an underlying rhythm of: sinus rhythm.  Additional history obtained: External records from outside source obtained and reviewed including: Chart  review including previous notes, labs, imaging.  Consultations obtained:  Disposition Continued outpatient therapy. Follow-up with PCP recommended for reevaluation of symptoms. Treatment plan discussed with patient.  Pt acknowledged understanding was agreeable to  the plan. Worrisome signs and symptoms were discussed with patient, and patient acknowledged understanding to return to the ED if they noticed these signs and symptoms. Patient was stable upon discharge.   This chart was dictated using voice recognition software.  Despite best efforts to proofread,  errors can occur which can change the documentation meaning.          Final Clinical Impression(s) / ED Diagnoses Final diagnoses:  Flu  COVID    Rx / DC Orders ED Discharge Orders          Ordered    nirmatrelvir/ritonavir, renal dosing, (PAXLOVID) 10 x 150 MG & 10 x '100MG'$  TABS  2 times daily        05/12/22 1705              Rex Kras, [place] 05/13/22 1120    Audley Hose, MD 05/13/22 1513

## 2022-05-12 NOTE — Discharge Instructions (Addendum)
You tested positive for COVID and strep today. Please take your medication as prescribed. You can try Mucinex or Dayquil/Nyquil for symptom relief. I recommend close follow-up with PCP for reevaluation.  Please do not hesitate to return to emergency department if worrisome signs symptoms we discussed become apparent.

## 2022-05-12 NOTE — ED Notes (Signed)
Pt verbalized understanding of d/c instructions, meds, and followup care. Denies questions. VSS, no distress noted. Steady gait to exit with all belongings.  ?

## 2022-05-12 NOTE — ED Triage Notes (Signed)
Pt arrives to ED with c/o sore throat x2 days.

## 2022-05-27 ENCOUNTER — Encounter: Admitting: Nurse Practitioner

## 2022-05-27 DIAGNOSIS — Z0000 Encounter for general adult medical examination without abnormal findings: Secondary | ICD-10-CM

## 2022-05-30 ENCOUNTER — Encounter: Payer: Self-pay | Admitting: Nurse Practitioner

## 2022-05-30 ENCOUNTER — Ambulatory Visit (INDEPENDENT_AMBULATORY_CARE_PROVIDER_SITE_OTHER): Admitting: Nurse Practitioner

## 2022-05-30 VITALS — BP 136/80 | HR 83 | Temp 98.1°F | Ht 73.0 in | Wt 301.2 lb

## 2022-05-30 DIAGNOSIS — H6122 Impacted cerumen, left ear: Secondary | ICD-10-CM

## 2022-05-30 DIAGNOSIS — Z13220 Encounter for screening for lipoid disorders: Secondary | ICD-10-CM

## 2022-05-30 DIAGNOSIS — E559 Vitamin D deficiency, unspecified: Secondary | ICD-10-CM | POA: Diagnosis not present

## 2022-05-30 DIAGNOSIS — Z79899 Other long term (current) drug therapy: Secondary | ICD-10-CM | POA: Diagnosis not present

## 2022-05-30 DIAGNOSIS — C61 Malignant neoplasm of prostate: Secondary | ICD-10-CM

## 2022-05-30 DIAGNOSIS — Z0001 Encounter for general adult medical examination with abnormal findings: Secondary | ICD-10-CM | POA: Diagnosis not present

## 2022-05-30 DIAGNOSIS — N182 Chronic kidney disease, stage 2 (mild): Secondary | ICD-10-CM

## 2022-05-30 DIAGNOSIS — Z130 Encounter for screening for diseases of the blood and blood-forming organs and certain disorders involving the immune mechanism: Secondary | ICD-10-CM

## 2022-05-30 DIAGNOSIS — E11319 Type 2 diabetes mellitus with unspecified diabetic retinopathy without macular edema: Secondary | ICD-10-CM

## 2022-05-30 DIAGNOSIS — Z131 Encounter for screening for diabetes mellitus: Secondary | ICD-10-CM | POA: Diagnosis not present

## 2022-05-30 DIAGNOSIS — Z136 Encounter for screening for cardiovascular disorders: Secondary | ICD-10-CM | POA: Diagnosis not present

## 2022-05-30 DIAGNOSIS — E349 Endocrine disorder, unspecified: Secondary | ICD-10-CM

## 2022-05-30 DIAGNOSIS — B353 Tinea pedis: Secondary | ICD-10-CM

## 2022-05-30 DIAGNOSIS — E1129 Type 2 diabetes mellitus with other diabetic kidney complication: Secondary | ICD-10-CM

## 2022-05-30 DIAGNOSIS — Z1389 Encounter for screening for other disorder: Secondary | ICD-10-CM | POA: Diagnosis not present

## 2022-05-30 DIAGNOSIS — R6889 Other general symptoms and signs: Secondary | ICD-10-CM | POA: Diagnosis not present

## 2022-05-30 DIAGNOSIS — Z87442 Personal history of urinary calculi: Secondary | ICD-10-CM

## 2022-05-30 DIAGNOSIS — I10 Essential (primary) hypertension: Secondary | ICD-10-CM | POA: Diagnosis not present

## 2022-05-30 DIAGNOSIS — E1169 Type 2 diabetes mellitus with other specified complication: Secondary | ICD-10-CM

## 2022-05-30 DIAGNOSIS — I440 Atrioventricular block, first degree: Secondary | ICD-10-CM

## 2022-05-30 DIAGNOSIS — G4733 Obstructive sleep apnea (adult) (pediatric): Secondary | ICD-10-CM

## 2022-05-30 DIAGNOSIS — E1122 Type 2 diabetes mellitus with diabetic chronic kidney disease: Secondary | ICD-10-CM

## 2022-05-30 DIAGNOSIS — I4510 Unspecified right bundle-branch block: Secondary | ICD-10-CM

## 2022-05-30 MED ORDER — TERBINAFINE HCL 1 % EX CREA: 1.0000 | TOPICAL_CREAM | Freq: Two times a day (BID) | CUTANEOUS | 1 refills | Status: DC

## 2022-05-30 NOTE — Progress Notes (Addendum)
COMPLETE PHYSICAL  Assessment and Plan:  Encounter for Annual Physical Exam with abnormal findings Due annually  Health Maintenance reviewed Healthy lifestyle reviewed and goals set  Type 2 diabetes mellitus with diabetic nephropathy ([place]) Dicussed risks of uncontrolled diabetes including cardiovascular disease, vision, kidney, neuropathy and death.  He declines med changes at this time, states would like to work on diet/lifestyle. Encouraged food/glucose log with close follow up. Education: Reviewed 'ABCs' of diabetes management  Discussed goals to be met and/or maintained include A1C (<7) Blood pressure (<130/80) Cholesterol (LDL <70) Continue Eye Exam yearly  Continue Dental Exam Q6 mo Discussed dietary recommendations Discussed Physical Activity recommendations Foot exam UTD Check A1C  Hyperlipidemia associated with type 2 diabetes mellitus ([place]) Discussed lifestyle modifications. Recommended diet heavy in fruits and veggies, omega 3's. Decrease consumption of animal meats, cheeses, and dairy products. Remain active and exercise as tolerated. Continue to monitor. Check lipids/TSH   CKD stage 3 due to type 2 diabetes mellitus (HCC)/ Microalbuminuria ([place]) Discussed how what you eat and drink can aide in kidney protection. Stay well hydrated. Avoid high salt foods. Avoid NSAIDS. Keep BP and BG well controlled.   Take medications as prescribed. Remain active and exercise as tolerated daily. Maintain weight.  Continue to monitor. Check CMP/GFR/Microablumin  Erectile dysfunction associated with type 2 diabetes mellitus (HCC) Keep BG and lipids well controlled Continue Sildenafil  Retinopathy, diabetic (HCC) Continue ophth, emphasized need for improved glucose control - due next month - has apt  Morbid obesity ([place]) - BMI 40 with OSA Weight trending down Discussed appropriate BMI Diet modification. Physical activity. Encouraged/praised to build  confidence.  Hereditary hemochromatosis ([place]) Monitor labs, check CBC/iron/ferritin  Essential hypertension Continue ASA, lisinopril,  Discussed DASH (Dietary Approaches to Stop Hypertension) DASH diet is lower in sodium than a typical American diet. Cut back on foods that are high in saturated fat, cholesterol, and trans fats. Eat more whole-grain foods, fish, poultry, and nuts Remain active and exercise as tolerated daily.  Monitor BP at home-Call if greater than 130/80.  Check CMP/CBC  OSA on CPAP Refer for evaluation of equipment and parts. Sleep apnea- continue CPAP CPAP is helping with daytime fatigue, weight loss still advised.   Medication management All medications discussed and reviewed in full. All questions and concerns regarding medications addressed.    Vitamin D deficiency Continue supplement Vitamin D (25 hydroxy)  History of nephrolithiasis No recent flare Increase water intake, urology follows  Prostate cancer Oncology following Seed implantation 11/2021 Continue tamsulosin  Ischemic heart disease RBBB, AV block 1 -     EKG 12-Lead  Screening for blood or protein in urine -     Urinalysis w microscopic + reflex cultur -     Microalbumin   Cerumen impaction Left ar(s) irrigated with lukewarm water, hydrogen peroxide. Large piece of brown cerumen x3 extracted with ear curette. TM:  WNL.  Pt tolerated procedure well.    Tinea Pedis Start topical terbinafine Apply as directed Change socks and shoes daily Keep feet dry Continue to monitor  Orders Placed This Encounter  Procedures   CBC with Differential/Platelet   COMPLETE METABOLIC PANEL WITH GFR   Magnesium   Lipid panel   TSH   Hemoglobin A1c   Insulin, random   VITAMIN D 25 Hydroxy (Vit-D Deficiency, Fractures)   Urinalysis, Routine w reflex microscopic   Microalbumin / creatinine urine ratio   Iron, TIBC and Ferritin Panel   EKG 12-Lead   Ear Lavage  Meds ordered this  encounter  Medications   terbinafine (LAMISIL) 1 % cream    Sig: Apply 1 Application topically 2 (two) times daily. For 4 weeks    Dispense:  42 g    Refill:  1    Order Specific Question:   Supervising Provider    Answer:   Unk Pinto [phone]    Notify office for further evaluation and treatment, questions or concerns if any reported s/s fail to improve.   The patient was advised to call back or seek an in-person evaluation if any symptoms worsen or if the condition fails to improve as anticipated.   Further disposition pending results of labs. Discussed med's effects and SE's.    I discussed the assessment and treatment plan with the patient. The patient was provided an opportunity to ask questions and all were answered. The patient agreed with the plan and demonstrated an understanding of the instructions.  Discussed med's effects and SE's. Screening labs and tests as requested with regular follow-up as recommended.  I provided 40 minutes of face-to-face time during this encounter including counseling, chart review, and critical decision making was preformed.   HPI 62 y.o. AA male  presents for a complete physical. He has Essential hypertension; T2_NIDDM with CKD III ([place]); Hyperlipidemia associated with type 2 diabetes mellitus ([place]); Testosterone deficiency; Vitamin D deficiency; Medication management; OSA on CPAP; History of nephrolithiasis; Morbid obesity ([place]); Hemochromatosis; Erectile dysfunction associated with type 2 diabetes mellitus ([place]); FH: hypertension; CKD stage 3 due to type 2 diabetes mellitus ([place]); Prostate cancer ([place]); Diabetic retinopathy associated with type 2 diabetes mellitus ([place]); RBBB; AV block, 1st degree; and Microalbuminuria due to type 2 diabetes mellitus ([place]) on their problem list.  He is married, had 3 children, 1 passed with GYN cancer in 2021. Has 2 grandchildren. He works as Agricultural consultant.   Was recently seen and treated in ED for  flu on 05/12/22.  Denies any residual symptoms.  He has stage T1c adenocarcinoma of the prostate with Gleason score of 3+4, and PSA of 11.3. Treated with prostate seed implantation 11/2021 He underwent CT scan complex simulation to delineate the three-dimensional structures of the pelvis and demonstrate the radiation distribution.  Since that time, the seed localization, and complex isodose planning with dose volume histograms have now been completed.  He has OSA on CPAP, reports 90% compliance with restorative sleep, however is questioning if machine needs to be replaced.  He does not remember where the machine came from and he has not changed out parts.  Unsure if it is affecting his sleep habits at this time although he reports compliance.   Concerned for BLE athlete's foot.  Itchy, dry skin, not responding to OTC treatments, including powders, creams, rubs.  BMI is Body mass index is 39.74 kg/m., he has been working on diet and exercise Wt Readings from Last 3 Encounters:  05/30/22 (!) 301 lb 3.2 oz (136.6 kg)  05/12/22 290 lb (131.5 kg)  12/29/21 (!) 308 lb 3.2 oz (139.8 kg)   His blood pressure is not checked at home, he is on lisinopril 20 mg, today their BP is BP: 136/80, last check was 100/70 He does workout.  He denies chest pain, shortness of breath, dizziness.    He is on metformin 2000 mg, glimepiride 4 mg BID, ozempic 1 mg, glargine 40 units. He admits to poor diet/exercise but is working hard to get better at lifestyle modifications. He has been working on diet and  exercise for diabetes, long hx of poorly controlled A1C on review with patient today  with CKD III he is on lisinopril  Hyperlipidemia not statin, cholesterol IS at goal less than 70, declines  ED, has viagra He is on ASA MF 2000 mg a day total He is on lantus 40 units Ozempic 1 mg weekly He is on amaryl 4 mg 1 pill BID Eye exam: Dr. Sabra Heck 2023 with retinopathy - has apt scheduled next month.  Meter:  Freestyle denies paresthesia of the feet, polydipsia and polyuria.  Last A1C in the office was:  Lab Results  Component Value Date   HGBA1C 9.0 (H) 05/06/2021   Lab Results  Component Value Date   GFRAA 55 (L) 04/30/2020   Lab Results  Component Value Date   CHOL 143 05/06/2021   HDL 50 05/06/2021   LDLCALC 72 05/06/2021   TRIG 120 05/06/2021   CHOLHDL 2.9 05/06/2021   Patient is on Vitamin D supplement, taking 10000 IU daily Lab Results  Component Value Date   VD25OH 32 05/06/2021   Has history of kidney stone, now following with Urology and Oncology for prostate cancer.   Lab Results  Component Value Date   PSA 6.65 (H) 04/30/2020   PSA 4.1 (H) 05/01/2019   PSA 3.8 06/05/2018   Hx of hemochromatosis, currently monitoring only, did see hematologist, switching off of testosterone injections helped.  Lab Results  Component Value Date   IRON 105 05/06/2021   TIBC 229 (L) 05/06/2021   FERRITIN 222 05/06/2021      Latest Ref Rng & Units 12/16/2021   10:38 AM 05/06/2021    4:24 PM 01/25/2021    4:24 PM  CBC  WBC 3.8 - 10.8 Thousand/uL  5.7  5.6   Hemoglobin 13.0 - 17.0 g/dL 15.0  16.0  15.5   Hematocrit 39.0 - 52.0 % 44.0  46.8  46.2   Platelets 140 - 400 Thousand/uL  233  210       Current Medications:   Current Outpatient Medications (Endocrine & Metabolic):    glimepiride (AMARYL) 4 MG tablet, Take 1 tablet 2 x /day with Meals for Diabetes (Patient taking differently: Take 4 mg by mouth 2 (two) times daily. Take 1 tablet 2 x /day with Meals for Diabetes)   LANTUS SOLOSTAR 100 UNIT/ML Solostar Pen, INJECT 30 TO 40 UNITS DAILY FOR DIABETES (Patient taking differently: Inject 40 Units into the skin daily. INJECT 30 TO 40 UNITS DAILY FOR DIABETES)   metFORMIN (GLUCOPHAGE-XR) 500 MG 24 hr tablet, TAKE 2 TABLETS TWICE A DAY WITH MEALS FOR DIABETES (Patient taking differently: Take 1,000 mg by mouth 2 (two) times daily. TAKE 2 TABLETS TWICE A DAY WITH MEALS FOR DIABETES)    OZEMPIC, 1 MG/DOSE, 4 MG/3ML SOPN, INJECT 1 MG UNDER THE SKIN ONCE WEEKLY (Patient taking differently: once a week. Sunday's)  Current Outpatient Medications (Cardiovascular):    lisinopril (ZESTRIL) 20 MG tablet, Take  1 tablet  Daily  for BP (Patient taking differently: Take 20 mg by mouth daily. Take  1 tablet  Daily  for BP)   sildenafil (VIAGRA) 100 MG tablet, Take  1/2 to 1 tablet  Daily  if needed for XXXX (Patient taking differently: Take  1/2 to 1 tablet  Daily  if needed for XXXX)   Current Outpatient Medications (Analgesics):    aspirin EC 81 MG tablet, Take 81 mg by mouth daily.   traMADol (ULTRAM) 50 MG tablet, Take 1-2 tablets (50-100 mg  total) by mouth every 6 (six) hours as needed for moderate pain. (Patient not taking: Reported on 05/30/2022)   Current Outpatient Medications (Other):    Cholecalciferol (VITAMIN D-3) 125 MCG (5000 UT) TABS, Take 15,000 Units by mouth daily. (Patient taking differently: Take 1 capsule by mouth 2 (two) times daily.)   CINNAMON PO, Take 2,000 mg by mouth 2 (two) times daily. Takes 2 tablets twice a day.   glucose blood (FREESTYLE LITE) test strip, CHECK BLOOD SUGAR 1 TIME A DAY-dx-E11.29.   glucose monitoring kit (FREESTYLE) monitoring kit, Check blood sugar 1 time a day-DX-E11.29   Lancets (FREESTYLE) lancets, CHECK BLOOD SUGAR 1 TIME A DAY-DX-E11.29.   terbinafine (LAMISIL) 1 % cream, Apply 1 Application topically 2 (two) times daily. For 4 weeks   tamsulosin (FLOMAX) 0.4 MG CAPS capsule, Take 1 capsule (0.4 mg total) by mouth daily. (Patient not taking: Reported on 12/29/2021)  Health Maintenance:  Immunization History  Administered Date(s) Administered   DTaP 04/02/2012   Influenza Inj Mdck Quad With Preservative 03/20/2018   Influenza Split 02/17/2014   Influenza,inj,Quad PF,6+ Mos 05/06/2021   Influenza-Unspecified 01/13/2017   PFIZER Comirnaty(Gray Top)Covid-19 Tri-Sucrose Vaccine 04/27/2021   PFIZER(Purple Top)SARS-COV-2 Vaccination  08/22/2019, 09/16/2019   PPD Test 06/05/2018   Pneumococcal Polysaccharide-23 04/02/2012   Tetanus: 2013 Pneumovax: 2013 Prevnar 13: due age 26 Flu vaccine: 2021 - declines  Shingrix: check with insurance Covid 19: 2/2, booster  DEXA: N/A Colonoscopy: 06/26/2020 Dr Hilarie Fredrickson, 3 year recall  EGD: N/A  Eye exam 04/08/2021, Dr. Sabra Heck, retinopathy - has scheduled update next month [facility]ist: [facility], 2022 Urologist: Dr. Louis Meckel, going q77mEndocrinologist: Dr. GCruzita Lederer has seen in the past, no longer seeing  Medical History:  Past Medical History:  Diagnosis Date   Chronic kidney disease (CKD), stage III (moderate) ([facility]    Erectile dysfunction 09/11/2014   Hereditary hemochromatosis ([facility]    previously followed by dr eMarin Olp(hemaology) , H63D gene mutation,   hx phlebotomies and / or donate blood   History of kidney stones    Hyperlipidemia    Hypertension    Hypogonadism male    Malignant neoplasm prostate ([facility] 06/2021   urologist-- dr herrick/  radiation oncology-- dr mTammi Klippel--  dx 02/ 2023  Gleason 3+4   Neuropathy    OSA on CPAP    pt stated uses nightly   Right bundle branch block    Type 2 diabetes mellitus treated with insulin ([facility]    followed by pcp   (12-13-2021  pt stated checks blood sugar daily fasting, average 120-130)   Allergies No Known Allergies  SURGICAL HISTORY He  has a past surgical history that includes Ankle surgery (Left, 2002); Rhinoplasty (2001); Extracorporeal shock wave lithotripsy (Left, 09-25-2014); Cystoscopy/retrograde/ureteroscopy/stone extraction with basket (Left, 12/05/2014); Cystoscopy w/ retrogrades (Left, 12/05/2014); Colonoscopy (2018); Radioactive seed implant (N/A, 12/16/2021); and SPACE OAR INSTILLATION (N/A, 12/16/2021).   FAMILY HISTORY His family history includes Breast cancer (age of onset: 530 in his mother; Diabetes in his father; Heart attack in his paternal grandfather; Hypertension in his mother; Uterine cancer (age of  onset: 380 in his daughter.   SOCIAL HISTORY He  reports that he has never smoked. He has never used smokeless tobacco. He reports current alcohol use. He reports that he does not use drugs.   Review of Systems  Constitutional:  Negative for malaise/fatigue and weight loss.  HENT:  Negative for hearing loss and tinnitus.   Eyes:  Negative for blurred vision and double vision.  Respiratory:  Negative for cough, sputum production, shortness of breath and wheezing.   Cardiovascular:  Negative for chest pain, palpitations, orthopnea, claudication, leg swelling and PND.  Gastrointestinal:  Negative for abdominal pain, blood in stool, constipation, diarrhea, heartburn, melena, nausea and vomiting.  Genitourinary: Negative.   Musculoskeletal:  Negative for falls, joint pain and myalgias.  Skin:  Negative for rash.  Neurological:  Negative for dizziness, tingling, sensory change, weakness and headaches.  Endo/Heme/Allergies:  Negative for polydipsia.  Psychiatric/Behavioral: Negative.  Negative for depression, memory loss, substance abuse and suicidal ideas. The patient is not nervous/anxious and does not have insomnia.   All other systems reviewed and are negative.   Physical Exam: Estimated body mass index is 39.74 kg/m as calculated from the following:   Height as of this encounter: '6\' 1"'$  (1.854 m).   Weight as of this encounter: 301 lb 3.2 oz (136.6 kg). BP 136/80   Pulse 83   Temp 98.1 F (36.7 C)   Ht '6\' 1"'$  (1.854 m)   Wt (!) 301 lb 3.2 oz (136.6 kg)   SpO2 98%   BMI 39.74 kg/m  General Appearance: Well nourished, in no apparent distress. Eyes: PERRLA, EOMs, conjunctiva no swelling or erythema Sinuses: No Frontal/maxillary tenderness ENT/Mouth: Ext aud canals clear after removal in the office, normal light reflex with left TMs UTA d/t cerumen impaction, right TM with normal TM And DOL. Good dentition. No erythema, swelling, or exudate on post pharynx. Tonsils not swollen or  erythematous. Hearing normal.  Neck: Supple, thyroid normal. No bruits Respiratory: Respiratory effort normal, BS equal bilaterally without rales, rhonchi, wheezing or stridor. Cardio: RRR without murmurs, rubs or gallops. Brisk peripheral pulses without edema.  Chest: symmetric, with normal excursions and percussion. Abdomen: Soft, +BS. Non tender, no guarding, rebound, hernias, masses, or organomegaly. .  Lymphatics: Non tender without lymphadenopathy.  Genitourinary: defer urologist Musculoskeletal: Full ROM all peripheral extremities,5/5 strength, and normal gait.Marland Kitchen  Neuro: Cranial nerves intact, reflexes equal bilaterally. Normal muscle tone, no cerebellar symptoms. Sensation intact to monofilament.   Psych: Awake and oriented X 3, normal affect, Insight and Judgment appropriate.  Skin: dark nevus left medial foot, unchanged, approx 8 mm Warm, dry without rashes, ecchymosis  EKG: RBBB, PVC, AVB1 no ST changes  Darrol Jump, NP 3:16 PM [facility]

## 2022-05-30 NOTE — Addendum Note (Signed)
Addended by: Darrol Jump on: 05/30/2022 03:16 PM   Modules accepted: Orders

## 2022-05-30 NOTE — Patient Instructions (Signed)

## 2022-05-31 LAB — COMPLETE METABOLIC PANEL WITH GFR
AG Ratio: 1.5 (calc) (ref 1.0–2.5)
ALT: 36 U/L (ref 9–46)
AST: 19 U/L (ref 10–35)
Albumin: 4.3 g/dL (ref 3.6–5.1)
Alkaline phosphatase (APISO): 134 U/L (ref 35–144)
BUN/Creatinine Ratio: 13 (calc) (ref 6–22)
BUN: 19 mg/dL (ref 7–25)
CO2: 29 mmol/L (ref 20–32)
Calcium: 9.9 mg/dL (ref 8.6–10.3)
Chloride: 108 mmol/L (ref 98–110)
Creat: 1.43 mg/dL — ABNORMAL HIGH (ref 0.70–1.30)
Globulin: 2.9 g/dL (calc) (ref 1.9–3.7)
Glucose, Bld: 111 mg/dL — ABNORMAL HIGH (ref 65–99)
Potassium: 4.2 mmol/L (ref 3.5–5.3)
Sodium: 143 mmol/L (ref 135–146)
Total Bilirubin: 0.7 mg/dL (ref 0.2–1.2)
Total Protein: 7.2 g/dL (ref 6.1–8.1)
eGFR: 56 mL/min/{1.73_m2} — ABNORMAL LOW (ref >=60)

## 2022-05-31 LAB — URINALYSIS, ROUTINE W REFLEX MICROSCOPIC
Bacteria, UA: NONE SEEN /HPF
Bilirubin Urine: NEGATIVE
Hgb urine dipstick: NEGATIVE
Hyaline Cast: NONE SEEN /LPF
Ketones, ur: NEGATIVE
Leukocytes,Ua: NEGATIVE
Nitrite: NEGATIVE
RBC / HPF: NONE SEEN /HPF (ref 0–2)
Specific Gravity, Urine: 1.019 (ref 1.001–1.035)
Squamous Epithelial / HPF: NONE SEEN /HPF (ref <=5)
WBC, UA: NONE SEEN /HPF (ref 0–5)
pH: 5.5 (ref 5.0–8.0)

## 2022-05-31 LAB — IRON,TIBC AND FERRITIN PANEL
%SAT: 40 % (calc) (ref 20–48)
Ferritin: 338 ng/mL (ref 38–380)
Iron: 89 ug/dL (ref 50–180)
TIBC: 222 mcg/dL (calc) — ABNORMAL LOW (ref 250–425)

## 2022-05-31 LAB — CBC WITH DIFFERENTIAL/PLATELET
Absolute Monocytes: 290 cells/uL (ref 200–950)
Basophils Absolute: 51 cells/uL (ref 0–200)
Basophils Relative: 1.1 %
Eosinophils Absolute: 216 cells/uL (ref 15–500)
Eosinophils Relative: 4.7 %
HCT: 42.4 % (ref 38.5–50.0)
Hemoglobin: 14.4 g/dL (ref 13.2–17.1)
Lymphs Abs: 1955 cells/uL (ref 850–3900)
MCH: 28.9 pg (ref 27.0–33.0)
MCHC: 34 g/dL (ref 32.0–36.0)
MCV: 85.1 fL (ref 80.0–100.0)
MPV: 11.5 fL (ref 7.5–12.5)
Monocytes Relative: 6.3 %
Neutro Abs: 2088 cells/uL (ref 1500–7800)
Neutrophils Relative %: 45.4 %
Platelets: 247 10*3/uL (ref 140–400)
RBC: 4.98 10*6/uL (ref 4.20–5.80)
RDW: 12.1 % (ref 11.0–15.0)
Total Lymphocyte: 42.5 %
WBC: 4.6 10*3/uL (ref 3.8–10.8)

## 2022-05-31 LAB — LIPID PANEL
Cholesterol: 144 mg/dL (ref <200)
HDL: 54 mg/dL (ref >=40)
LDL Cholesterol (Calc): 69 mg/dL (calc)
Non-HDL Cholesterol (Calc): 90 mg/dL (calc) (ref <130)
Total CHOL/HDL Ratio: 2.7 (calc) (ref <5.0)
Triglycerides: 124 mg/dL (ref <150)

## 2022-05-31 LAB — MICROALBUMIN / CREATININE URINE RATIO
Creatinine, Urine: 106 mg/dL (ref 20–320)
Microalb Creat Ratio: 496 mcg/mg creat — ABNORMAL HIGH (ref <30)
Microalb, Ur: 52.6 mg/dL

## 2022-05-31 LAB — HEMOGLOBIN A1C
Hgb A1c MFr Bld: 8 % of total Hgb — ABNORMAL HIGH (ref <5.7)
Mean Plasma Glucose: 183 mg/dL
eAG (mmol/L): 10.1 mmol/L

## 2022-05-31 LAB — VITAMIN D 25 HYDROXY (VIT D DEFICIENCY, FRACTURES): Vit D, 25-Hydroxy: 41 ng/mL (ref 30–100)

## 2022-05-31 LAB — TSH: TSH: 0.93 mIU/L (ref 0.40–4.50)

## 2022-05-31 LAB — MICROSCOPIC MESSAGE

## 2022-05-31 LAB — INSULIN, RANDOM: Insulin: 46.4 u[IU]/mL — ABNORMAL HIGH

## 2022-05-31 LAB — MAGNESIUM: Magnesium: 2.1 mg/dL (ref 1.5–2.5)

## 2022-06-02 ENCOUNTER — Telehealth: Payer: Self-pay | Admitting: Nurse Practitioner

## 2022-06-02 DIAGNOSIS — E1122 Type 2 diabetes mellitus with diabetic chronic kidney disease: Secondary | ICD-10-CM

## 2022-06-02 MED ORDER — OZEMPIC (1 MG/DOSE) 4 MG/3ML ~~LOC~~ SOPN: 1.0000 mg | PEN_INJECTOR | SUBCUTANEOUS | 5 refills | Status: DC

## 2022-06-02 NOTE — Addendum Note (Signed)
Addended by: Chancy Hurter on: 06/02/2022 05:01 PM   Modules accepted: Orders

## 2022-06-02 NOTE — Telephone Encounter (Signed)
Pt's Ozempic 1 mg wasn't sent [facility] to [facility]

## 2022-06-03 ENCOUNTER — Other Ambulatory Visit: Payer: Self-pay | Admitting: Nurse Practitioner

## 2022-06-03 DIAGNOSIS — G4733 Obstructive sleep apnea (adult) (pediatric): Secondary | ICD-10-CM

## 2022-06-13 ENCOUNTER — Encounter: Payer: Self-pay | Admitting: Family

## 2022-06-27 LAB — HM DIABETES EYE EXAM

## 2022-06-28 ENCOUNTER — Encounter: Payer: Self-pay | Admitting: Internal Medicine

## 2022-08-03 ENCOUNTER — Telehealth: Payer: Self-pay

## 2022-08-04 ENCOUNTER — Ambulatory Visit (INDEPENDENT_AMBULATORY_CARE_PROVIDER_SITE_OTHER): Admitting: Neurology

## 2022-08-04 ENCOUNTER — Encounter: Payer: Self-pay | Admitting: Neurology

## 2022-08-04 VITALS — BP 169/81 | HR 67 | Ht 74.0 in | Wt 309.2 lb

## 2022-08-04 DIAGNOSIS — I440 Atrioventricular block, first degree: Secondary | ICD-10-CM | POA: Diagnosis not present

## 2022-08-04 DIAGNOSIS — G4733 Obstructive sleep apnea (adult) (pediatric): Secondary | ICD-10-CM | POA: Diagnosis not present

## 2022-08-04 DIAGNOSIS — I10 Essential (primary) hypertension: Secondary | ICD-10-CM

## 2022-08-04 DIAGNOSIS — I4510 Unspecified right bundle-branch block: Secondary | ICD-10-CM

## 2022-08-04 DIAGNOSIS — N183 Chronic kidney disease, stage 3 unspecified: Secondary | ICD-10-CM

## 2022-08-04 DIAGNOSIS — Z8659 Personal history of other mental and behavioral disorders: Secondary | ICD-10-CM

## 2022-08-04 DIAGNOSIS — R809 Proteinuria, unspecified: Secondary | ICD-10-CM

## 2022-08-04 DIAGNOSIS — E1122 Type 2 diabetes mellitus with diabetic chronic kidney disease: Secondary | ICD-10-CM | POA: Diagnosis not present

## 2022-08-04 DIAGNOSIS — E1129 Type 2 diabetes mellitus with other diabetic kidney complication: Secondary | ICD-10-CM

## 2022-08-04 NOTE — Patient Instructions (Signed)
Screening for Sleep Apnea  Sleep apnea is a condition in which breathing pauses or becomes shallow during sleep. Sleep apnea screening is a test to determine if you are at risk for sleep apnea. The test includes a series of questions. It will only takes a few minutes. Your health care provider may ask you to have this test in preparation for surgery or as part of a physical exam. What are the symptoms of sleep apnea? Common symptoms of sleep apnea include: Snoring. Waking up often at night. Daytime sleepiness. Pauses in breathing. Choking or gasping during sleep. Irritability. Forgetfulness. Trouble thinking clearly. Depression. Personality changes. Most people with sleep apnea do not know that they have it. What are the advantages of sleep apnea screening? Getting screened for sleep apnea can help: Ensure your safety. It is important for your health care providers to know whether or not you have sleep apnea, especially if you are having surgery or have other long-term (chronic) health conditions. Improve your health and allow you to get a better night's rest. Restful sleep can help you: Have more energy. Lose weight. Improve high blood pressure. Improve diabetes management. Prevent stroke. Prevent car accidents. What happens during the screening? Screening usually includes being asked a list of questions about your sleep quality. Some questions you may be asked include: Do you snore? Is your sleep restless? Do you have daytime sleepiness? Has a partner or spouse told you that you stop breathing during sleep? Have you had trouble concentrating or memory loss? What is your age? What is your neck circumference? To measure your neck, keep your back straight and gently wrap the tape measure around your neck. Put the tape measure at the middle of your neck, between your chin and collarbone. What is your sex assigned at birth? Do you have or are you being treated for high blood  pressure? If your screening test is positive, you are at risk for the condition. Further testing may be needed to confirm a diagnosis of sleep apnea. Where to find more information You can find screening tools online or at your health care clinic. For more information about sleep apnea screening and healthy sleep, visit these websites: Centers for Disease Control and Prevention: www.cdc.gov American Sleep Apnea Association: www.sleepapnea.org Contact a health care provider if: You think that you may have sleep apnea. Summary Sleep apnea screening can help determine if you are at risk for sleep apnea. It is important for your health care providers to know whether or not you have sleep apnea, especially if you are having surgery or have other chronic health conditions. You may be asked to take a screening test for sleep apnea in preparation for surgery or as part of a physical exam. This information is not intended to replace advice given to you by your health care provider. Make sure you discuss any questions you have with your health care provider. Document Revised: 03/27/2020 Document Reviewed: 03/27/2020 Elsevier Patient Education  2023 [employer]  
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
## 2022-08-10 NOTE — Telephone Encounter (Signed)
Na

## 2022-08-17 ENCOUNTER — Telehealth: Payer: Self-pay | Admitting: Neurology

## 2022-08-17 NOTE — Telephone Encounter (Signed)
[employer] pending faxed notes 

## 2022-08-24 NOTE — Telephone Encounter (Signed)
4/24:LVM-MLA 08/18/22 [employer] no auth req via fax form EE

## 2022-08-29 NOTE — Telephone Encounter (Signed)
08/29/22 unable to leave vmail mail box is full EE

## 2022-08-30 ENCOUNTER — Telehealth: Payer: Self-pay | Admitting: Neurology

## 2022-08-30 NOTE — Telephone Encounter (Signed)
I spoke with the patient to schedule his SS.  He is scheduled at [facility] for 11/07/2022 at 9 pm.  Mailed packet to the patient.

## 2022-08-30 NOTE — Telephone Encounter (Signed)
I spoke with the patient to schedule his SS.  He is scheduled at [facility] for 11/07/2022 at 9 pm.  Mailed packet to the patient.   SPLIT- [employer] no auth req via fax form

## 2022-08-30 NOTE — Telephone Encounter (Signed)
Also see previous phone note from 08/17/2022.

## 2022-08-30 NOTE — Telephone Encounter (Signed)
Patient called in to schedule a sleep study. Reviewed chart and our sleep lab has reached out multiple times to schedule this. Patient states he has tried calling for 3 weeks and has been unable to get a hold of anyone. I let him know they have tried calling, I reached out to [facility] and she was on the other line with insurance and unable to take his call at that time, but that she could call him back as soon as she gets off the phone. I let him know if he does not here anything to call back and speak with main line and ask to speak with someone regarding scheduling a sleep study as we do not do that in new patient department. Patient stated he will wait for a call back.

## 2022-09-05 ENCOUNTER — Other Ambulatory Visit: Payer: Self-pay | Admitting: Nurse Practitioner

## 2022-09-14 ENCOUNTER — Ambulatory Visit: Admitting: Nurse Practitioner

## 2022-09-19 ENCOUNTER — Ambulatory Visit (INDEPENDENT_AMBULATORY_CARE_PROVIDER_SITE_OTHER): Admitting: Nurse Practitioner

## 2022-09-19 ENCOUNTER — Encounter: Payer: Self-pay | Admitting: Nurse Practitioner

## 2022-09-19 VITALS — BP 138/76 | HR 85 | Temp 97.9°F | Ht 74.0 in | Wt 310.4 lb

## 2022-09-19 DIAGNOSIS — E559 Vitamin D deficiency, unspecified: Secondary | ICD-10-CM

## 2022-09-19 DIAGNOSIS — E785 Hyperlipidemia, unspecified: Secondary | ICD-10-CM

## 2022-09-19 DIAGNOSIS — E349 Endocrine disorder, unspecified: Secondary | ICD-10-CM

## 2022-09-19 DIAGNOSIS — E1122 Type 2 diabetes mellitus with diabetic chronic kidney disease: Secondary | ICD-10-CM | POA: Diagnosis not present

## 2022-09-19 DIAGNOSIS — E1169 Type 2 diabetes mellitus with other specified complication: Secondary | ICD-10-CM | POA: Diagnosis not present

## 2022-09-19 DIAGNOSIS — E11319 Type 2 diabetes mellitus with unspecified diabetic retinopathy without macular edema: Secondary | ICD-10-CM | POA: Diagnosis not present

## 2022-09-19 DIAGNOSIS — E1121 Type 2 diabetes mellitus with diabetic nephropathy: Secondary | ICD-10-CM

## 2022-09-19 DIAGNOSIS — I10 Essential (primary) hypertension: Secondary | ICD-10-CM

## 2022-09-19 DIAGNOSIS — N521 Erectile dysfunction due to diseases classified elsewhere: Secondary | ICD-10-CM

## 2022-09-19 DIAGNOSIS — Z87442 Personal history of urinary calculi: Secondary | ICD-10-CM

## 2022-09-19 DIAGNOSIS — Z79899 Other long term (current) drug therapy: Secondary | ICD-10-CM

## 2022-09-19 DIAGNOSIS — G4733 Obstructive sleep apnea (adult) (pediatric): Secondary | ICD-10-CM

## 2022-09-19 DIAGNOSIS — N183 Chronic kidney disease, stage 3 unspecified: Secondary | ICD-10-CM

## 2022-09-19 NOTE — Patient Instructions (Signed)

## 2022-09-19 NOTE — Progress Notes (Signed)
Follow Up  Assessment and Plan:  Type 2 diabetes mellitus with diabetic nephropathy (HCC) Continue glimepiride, ozempic glargine 40 units.  Discussed increase in ozempic next month after completion of current Rx packet. Education: Reviewed 'ABCs' of diabetes management  Discussed goals to be met and/or maintained include A1C (<7) Blood pressure (<130/80) Cholesterol (LDL <70) Continue Eye Exam yearly  Continue Dental Exam Q6 mo Discussed dietary recommendations Discussed Physical Activity recommendations Check A1C  Hyperlipidemia associated with type 2 diabetes mellitus (HCC) Discussed lifestyle modifications. Recommended diet heavy in fruits and veggies, omega 3's. Decrease consumption of animal meats, cheeses, and dairy products. Remain active and exercise as tolerated. Continue to monitor. Check lipids/TSH  CKD stage 3 due to type 2 diabetes mellitus (HCC)/ Microalbuminuria (HCC) Discussed how what you eat and drink can aide in kidney protection. Stay well hydrated. Avoid high salt foods. Avoid NSAIDS. Keep BP and BG well controlled.   Take medications as prescribed. Remain active and exercise as tolerated daily. Maintain weight.  Continue to monitor. Check CMP/GFR/Microablumin  Erectile dysfunction associated with type 2 diabetes mellitus (HCC) Continue Sildenafil Suggest Zinc supplement Continue to monitor  Retinopathy, diabetic (HCC) Continue ophth, emphasized need for improved glucose control  Continue to monitor  Morbid obesity (HCC) - BMI 40 with OSA Discussed appropriate BMI Diet modification. Physical activity. Encouraged/praised to build confidence.  Hereditary hemochromatosis (HCC) Monitor labs, check CBC/iron/ferritin  Essential hypertension Discussed DASH (Dietary Approaches to Stop Hypertension) DASH diet is lower in sodium than a typical American diet. Cut back on foods that are high in saturated fat, cholesterol, and trans fats. Eat more  whole-grain foods, fish, poultry, and nuts Remain active and exercise as tolerated daily.  Monitor BP at home-Call if greater than 130/80.  Check CMP/CBC  OSA on CPAP Continue CPAP compliance  Medication management All medications discussed and reviewed in full. All questions and concerns regarding medications addressed.    Vitamin D deficiency Continue supplement for goal of 60-100 Monitor Vitamin D levels  Testosterone deficiency Continue to monitor  History of nephrolithiasis Controlled Increase water intake, urology follows  Orders Placed This Encounter  Procedures   CBC with Differential/Platelet   COMPLETE METABOLIC PANEL WITH GFR   Lipid panel   Hemoglobin A1c   VITAMIN D 25 Hydroxy (Vit-D Deficiency, Fractures)    Notify office for further evaluation and treatment, questions or concerns if any reported s/s fail to improve.   The patient was advised to call back or seek an in-person evaluation if any symptoms worsen or if the condition fails to improve as anticipated.   Further disposition pending results of labs. Discussed med's effects and SE's.    I discussed the assessment and treatment plan with the patient. The patient was provided an opportunity to ask questions and all were answered. The patient agreed with the plan and demonstrated an understanding of the instructions.  Discussed med's effects and SE's. Screening labs and tests as requested with regular follow-up as recommended.  I provided 60 minutes of face-to-face time during this encounter including counseling, chart review, and critical decision making was preformed.  Today's Plan of Care is based on a patient-centered health care approach known as shared decision making - the decisions, tests and treatments allow for patient preferences and values to be balanced with clinical evidence.     Future Appointments  Date Time Provider Department Center  11/07/2022  9:00 PM [facility]  None  06/06/2023  9:00 AM Adela Glimpse, NP [facility]  None   HPI 62 y.o. AA male  presents for a general follow up. He has Essential hypertension; T2_NIDDM with CKD III (HCC); Hyperlipidemia associated with type 2 diabetes mellitus (HCC); Testosterone deficiency; Vitamin D deficiency; Medication management; OSA (obstructive sleep apnea); History of nephrolithiasis; Morbid obesity (HCC); Hemochromatosis; Erectile dysfunction associated with type 2 diabetes mellitus (HCC); FH: hypertension; CKD stage 3 due to type 2 diabetes mellitus (HCC); Prostate cancer (HCC); Diabetic retinopathy associated with type 2 diabetes mellitus (HCC); RBBB; AV block, 1st degree; Microalbuminuria due to type 2 diabetes mellitus (HCC); and History of posttraumatic stress disorder (PTSD) on their problem list.  He is married, had 3 children, 1 passed with GYN cancer in 2021. Has 2 grandchildren. He works as Nurse, children's.   Overall he reports feeling well today.  He has no new concerns at this time.  He has OSA on CPAP, reports 90% compliance with restorative sleep.   BMI is Body mass index is 39.85 kg/m., he has been working on diet and exercise,however weigh has increased over the last 3 mo.  Wt Readings from Last 3 Encounters:  09/19/22 (!) 310 lb 6.4 oz (140.8 kg)  08/04/22 (!) 309 lb 3.2 oz (140.3 kg)  05/30/22 (!) 301 lb 3.2 oz (136.6 kg)   His blood pressure is not checked at home, he is on lisinopril 20 mg, today their BP is BP: 138/76, last check was 100/70 He does workout.  He denies chest pain, shortness of breath, dizziness.   He has not been working on diet and exercise for diabetes, long hx of poorly controlled A1C on review with patient today  with CKD III he is on lisinopril  Hyperlipidemia not statin, cholesterol IS at goal less than 70, declines  ED, has viagra He is on ASA MF 2000 mg a day total He is on lantus 40 units Ozempic 1 mg weekly He is on amaryl 4 mg 1 pill BID Eye  exam: Dr. Hyacinth Meeker 2022 with retinopathy Meter: Freestyle Reports fasting unknown denies paresthesia of the feet, polydipsia and polyuria.  Last A1C in the office was and is slow trending down Lab Results  Component Value Date   HGBA1C 8.0 (H) 05/30/2022   Lab Results  Component Value Date   GFRAA 55 (L) 04/30/2020   Lab Results  Component Value Date   CHOL 144 05/30/2022   HDL 54 05/30/2022   LDLCALC 69 05/30/2022   TRIG 124 05/30/2022   CHOLHDL 2.7 05/30/2022   Patient is on Vitamin D supplement, taking 40981 IU daily Lab Results  Component Value Date   VD25OH 70 05/30/2022   He is has a history of testosterone deficiency and is on testosterone replacement at this time, only on 2 pumps each arm a day He states that the testosterone is help with his energy, libido, muscle mass.  Lab Results  Component Value Date   TESTOSTERONE 327 05/06/2021   Has history of kidney stone, recently elevated PSAs up to 11+, follows with Dr. Marlou Porch. Per his note in 11/2020 patient had MRI showing PI-RADS 2, monitoring.  Lab Results  Component Value Date   PSA 6.65 (H) 04/30/2020   PSA 4.1 (H) 05/01/2019   PSA 3.8 06/05/2018   Hx of hemochromatosis, currently monitoring only, did see hematologist, switching off of testosterone injections helped.  Lab Results  Component Value Date   IRON 89 05/30/2022   TIBC 222 (L) 05/30/2022   FERRITIN 338 05/30/2022      Latest Ref  Rng & Units 05/30/2022    3:13 PM 12/16/2021   10:38 AM 05/06/2021    4:24 PM  CBC  WBC 3.8 - 10.8 Thousand/uL 4.6   5.7   Hemoglobin 13.2 - 17.1 g/dL 91.4  78.2  95.6   Hematocrit 38.5 - 50.0 % 42.4  44.0  46.8   Platelets 140 - 400 Thousand/uL 247   233       Current Medications:   Current Outpatient Medications (Endocrine & Metabolic):    glimepiride (AMARYL) 4 MG tablet, Take 1 tablet 2 x /day with Meals for Diabetes (Patient taking differently: Take 4 mg by mouth 2 (two) times daily. Take 1 tablet 2 x /day with  Meals for Diabetes)   insulin glargine (LANTUS SOLOSTAR) 100 UNIT/ML Solostar Pen, Inject 40 Units into the skin daily. INJECT 30 TO 40 UNITS DAILY FOR DIABETES   metFORMIN (GLUCOPHAGE-XR) 500 MG 24 hr tablet, TAKE 2 TABLETS TWICE A DAY WITH MEALS FOR DIABETES (Patient taking differently: Take 1,000 mg by mouth 2 (two) times daily. TAKE 2 TABLETS TWICE A DAY WITH MEALS FOR DIABETES)   Semaglutide, 1 MG/DOSE, (OZEMPIC, 1 MG/DOSE,) 4 MG/3ML SOPN, Inject 1 mg as directed once a week.  Current Outpatient Medications (Cardiovascular):    lisinopril (ZESTRIL) 20 MG tablet, Take  1 tablet  Daily  for BP (Patient taking differently: Take 20 mg by mouth daily. Take  1 tablet  Daily  for BP)   sildenafil (VIAGRA) 100 MG tablet, Take  1/2 to 1 tablet  Daily  if needed for XXXX (Patient taking differently: Take  1/2 to 1 tablet  Daily  if needed for XXXX)   Current Outpatient Medications (Analgesics):    aspirin EC 81 MG tablet, Take 81 mg by mouth daily.   traMADol (ULTRAM) 50 MG tablet, Take 1-2 tablets (50-100 mg total) by mouth every 6 (six) hours as needed for moderate pain. (Patient not taking: Reported on 05/30/2022)   Current Outpatient Medications (Other):    Cholecalciferol (VITAMIN D-3) 125 MCG (5000 UT) TABS, Take 15,000 Units by mouth daily. (Patient taking differently: Take 1 capsule by mouth 2 (two) times daily.)   CINNAMON PO, Take 2,000 mg by mouth 2 (two) times daily. Takes 2 tablets twice a day.   glucose blood (FREESTYLE LITE) test strip, CHECK BLOOD SUGAR 1 TIME A DAY-dx-E11.29.   glucose monitoring kit (FREESTYLE) monitoring kit, Check blood sugar 1 time a day-DX-E11.29   Lancets (FREESTYLE) lancets, CHECK BLOOD SUGAR 1 TIME A DAY-DX-E11.29.   terbinafine (LAMISIL) 1 % cream, Apply 1 Application topically 2 (two) times daily. For 4 weeks   tamsulosin (FLOMAX) 0.4 MG CAPS capsule, Take 1 capsule (0.4 mg total) by mouth daily. (Patient not taking: Reported on 12/29/2021)  Health  Maintenance:  Immunization History  Administered Date(s) Administered   DTaP 04/02/2012   Influenza Inj Mdck Quad With Preservative 03/20/2018   Influenza Split 02/17/2014   Influenza,inj,Quad PF,6+ Mos 05/06/2021   Influenza-Unspecified 01/13/2017   PFIZER Comirnaty(Gray Top)Covid-19 Tri-Sucrose Vaccine 04/27/2021   PFIZER(Purple Top)SARS-COV-2 Vaccination 08/22/2019, 09/16/2019   PPD Test 06/05/2018   Pneumococcal Polysaccharide-23 04/02/2012   Medical History:  Past Medical History:  Diagnosis Date   Chronic kidney disease (CKD), stage III (moderate) (HCC)    Erectile dysfunction 09/11/2014   Hereditary hemochromatosis (HCC)    previously followed by dr Myna Hidalgo (hemaology) , H63D gene mutation,   hx phlebotomies and / or donate blood   History of kidney stones    Hyperlipidemia  Hypertension    Hypogonadism male    Malignant neoplasm prostate [facility]) 06/2021   urologist-- dr herrick/  radiation oncology-- dr Kathrynn Running---  dx 02/ 2023  Gleason 3+4   Neuropathy    OSA on CPAP    pt stated uses nightly   Right bundle branch block    Type 2 diabetes mellitus treated with insulin (HCC)    followed by pcp   (12-13-2021  pt stated checks blood sugar daily fasting, average 120-130)   Allergies No Known Allergies  SURGICAL HISTORY He  has a past surgical history that includes Ankle surgery (Left, 2002); Rhinoplasty (2001); Extracorporeal shock wave lithotripsy (Left, 09-25-2014); Cystoscopy/retrograde/ureteroscopy/stone extraction with basket (Left, 12/05/2014); Cystoscopy w/ retrogrades (Left, 12/05/2014); Colonoscopy (2018); Radioactive seed implant (N/A, 12/16/2021); and SPACE OAR INSTILLATION (N/A, 12/16/2021).   FAMILY HISTORY His family history includes Breast cancer (age of onset: 15) in his mother; Diabetes in his father; Heart attack in his paternal grandfather; Hypertension in his mother; Uterine cancer (age of onset: 65) in his daughter.   SOCIAL HISTORY He  reports that he  has never smoked. He has never used smokeless tobacco. He reports current alcohol use. He reports that he does not use drugs.   Review of Systems  Constitutional:  Negative for malaise/fatigue and weight loss.  HENT:  Negative for hearing loss and tinnitus.   Eyes:  Negative for blurred vision and double vision.  Respiratory:  Negative for cough, sputum production, shortness of breath and wheezing.   Cardiovascular:  Negative for chest pain, palpitations, orthopnea, claudication, leg swelling and PND.  Gastrointestinal:  Negative for abdominal pain, blood in stool, constipation, diarrhea, heartburn, melena, nausea and vomiting.  Genitourinary: Negative.   Musculoskeletal:  Negative for falls, joint pain and myalgias.  Skin:  Negative for rash.  Neurological:  Negative for dizziness, tingling, sensory change, weakness and headaches.  Endo/Heme/Allergies:  Negative for polydipsia.  Psychiatric/Behavioral: Negative.  Negative for depression, memory loss, substance abuse and suicidal ideas. The patient is not nervous/anxious and does not have insomnia.   All other systems reviewed and are negative.   Physical Exam: Estimated body mass index is 39.85 kg/m as calculated from the following:   Height as of this encounter: 6\' 2"  (1.88 m).   Weight as of this encounter: 310 lb 6.4 oz (140.8 kg). BP 138/76   Pulse 85   Temp 97.9 F (36.6 C)   Ht 6\' 2"  (1.88 m)   Wt (!) 310 lb 6.4 oz (140.8 kg)   SpO2 97%   BMI 39.85 kg/m  General Appearance: Well nourished, in no apparent distress. Eyes: PERRLA, EOMs, conjunctiva no swelling or erythema Sinuses: No Frontal/maxillary tenderness ENT/Mouth: Ext aud canals clear after removal in the office, normal light reflex with TMs without erythema after bilateral ears were cleaned in the office, bulging. Good dentition. No erythema, swelling, or exudate on post pharynx. Tonsils not swollen or erythematous. Hearing normal.  Neck: Supple, thyroid normal. No  bruits Respiratory: Respiratory effort normal, BS equal bilaterally without rales, rhonchi, wheezing or stridor. Cardio: RRR without murmurs, rubs or gallops. Brisk peripheral pulses without edema.  Chest: symmetric, with normal excursions and percussion. Abdomen: Soft, +BS. Non tender, no guarding, rebound, hernias, masses, or organomegaly. .  Lymphatics: Non tender without lymphadenopathy.  Genitourinary: defer urologist Musculoskeletal: Full ROM all peripheral extremities,5/5 strength, and normal gait.Marland Kitchen  Neuro: Cranial nerves intact, reflexes equal bilaterally. Normal muscle tone, no cerebellar symptoms. Sensation intact to monofilament.   Psych: Awake and oriented  X 3, normal affect, Insight and Judgment appropriate.  Skin: dark nevus left medial foot, unchanged, approx 8 mm Warm, dry without rashes, ecchymosis  Adela Glimpse, NP 12:03 PM [facility]

## 2022-09-20 LAB — COMPLETE METABOLIC PANEL WITH GFR
AG Ratio: 1.7 (calc) (ref 1.0–2.5)
ALT: 33 U/L (ref 9–46)
AST: 18 U/L (ref 10–35)
Albumin: 4.2 g/dL (ref 3.6–5.1)
Alkaline phosphatase (APISO): 119 U/L (ref 35–144)
BUN/Creatinine Ratio: 15 (calc) (ref 6–22)
BUN: 26 mg/dL — ABNORMAL HIGH (ref 7–25)
CO2: 29 mmol/L (ref 20–32)
Calcium: 9.8 mg/dL (ref 8.6–10.3)
Chloride: 106 mmol/L (ref 98–110)
Creat: 1.7 mg/dL — ABNORMAL HIGH (ref 0.70–1.30)
Globulin: 2.5 g/dL (calc) (ref 1.9–3.7)
Glucose, Bld: 219 mg/dL — ABNORMAL HIGH (ref 65–99)
Potassium: 4.7 mmol/L (ref 3.5–5.3)
Sodium: 143 mmol/L (ref 135–146)
Total Bilirubin: 1 mg/dL (ref 0.2–1.2)
Total Protein: 6.7 g/dL (ref 6.1–8.1)
eGFR: 46 mL/min/{1.73_m2} — ABNORMAL LOW (ref >=60)

## 2022-09-20 LAB — CBC WITH DIFFERENTIAL/PLATELET
Absolute Monocytes: 270 cells/uL (ref 200–950)
Basophils Absolute: 50 cells/uL (ref 0–200)
Basophils Relative: 1 %
Eosinophils Absolute: 240 cells/uL (ref 15–500)
Eosinophils Relative: 4.8 %
HCT: 44.6 % (ref 38.5–50.0)
Hemoglobin: 15 g/dL (ref 13.2–17.1)
Lymphs Abs: 1875 cells/uL (ref 850–3900)
MCH: 28.9 pg (ref 27.0–33.0)
MCHC: 33.6 g/dL (ref 32.0–36.0)
MCV: 85.9 fL (ref 80.0–100.0)
MPV: 11.3 fL (ref 7.5–12.5)
Monocytes Relative: 5.4 %
Neutro Abs: 2565 cells/uL (ref 1500–7800)
Neutrophils Relative %: 51.3 %
Platelets: 230 10*3/uL (ref 140–400)
RBC: 5.19 10*6/uL (ref 4.20–5.80)
RDW: 12.2 % (ref 11.0–15.0)
Total Lymphocyte: 37.5 %
WBC: 5 10*3/uL (ref 3.8–10.8)

## 2022-09-20 LAB — LIPID PANEL
Cholesterol: 138 mg/dL (ref <200)
HDL: 52 mg/dL (ref >=40)
LDL Cholesterol (Calc): 61 mg/dL (calc)
Non-HDL Cholesterol (Calc): 86 mg/dL (calc) (ref <130)
Total CHOL/HDL Ratio: 2.7 (calc) (ref <5.0)
Triglycerides: 176 mg/dL — ABNORMAL HIGH (ref <150)

## 2022-09-20 LAB — HEMOGLOBIN A1C
Hgb A1c MFr Bld: 7.9 % of total Hgb — ABNORMAL HIGH (ref <5.7)
Mean Plasma Glucose: 180 mg/dL
eAG (mmol/L): 10 mmol/L

## 2022-09-20 LAB — VITAMIN D 25 HYDROXY (VIT D DEFICIENCY, FRACTURES): Vit D, 25-Hydroxy: 38 ng/mL (ref 30–100)

## 2022-10-09 IMAGING — MR MR PROSTATE WO/W CM
13 series · 48 of 48 positions shown · IV contrast (multihance)
Comparison: None.

CLINICAL DATA: Elevated PSA.

EXAM:
MR PROSTATE WITHOUT AND WITH CONTRAST
TECHNIQUE: Multiplanar multisequence MRI images were obtained of the pelvis
centered about the prostate. Pre and post contrast images were
obtained.
CONTRAST:  20mL MULTIHANCE GADOBENATE DIMEGLUMINE 529 MG/ML IV SOLN

[Series 3: T2 · coronal · 3.0mm · 0.56mm/px · 1 of 23 slices shown (1 of 4)]
[im 1/23]
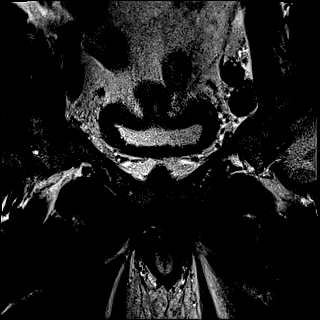

[Series 4: T1 · axial · 5.0mm · 1.25mm/px · 1 of 88 slices shown]
[im 1/88]
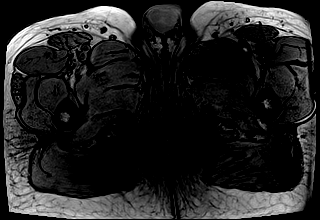

[Series 6: DWI · axial · 3.0mm · 1.75mm/px · z∈[-34,+35]mm · 2 of 70 slices shown (1 of 3)]
[im 1/70]
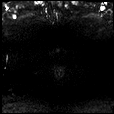
[im 70/70]
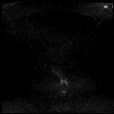

[Series 7: DWI · axial · 3.0mm · 1.75mm/px · 1 of 24 slices shown (2 of 3)]
[im 1/24]
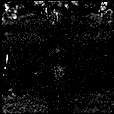

[Series 8: DWI · axial · 3.0mm · 1.75mm/px · 1 of 24 slices shown (3 of 3)]
[im 1/24]
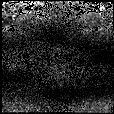

[Series 9: T2 · axial · 3.0mm · 0.56mm/px · 1 of 25 slices shown (2 of 4)]
[im 1/25]
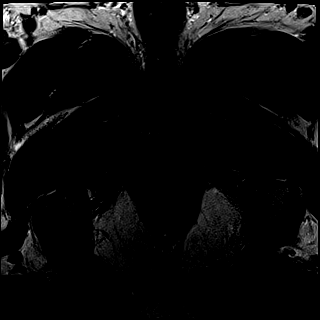

[Series 10: T2 · axial · 1.0mm · 1.04mm/px · z∈[-41,+38]mm · 2 of 80 slices shown (3 of 4)]
[im 1/80]
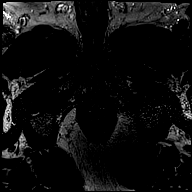
[im 80/80]
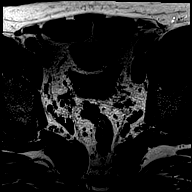

[Series 11: pre t1_twist_tra_dyn · axial · non-contrast · 3.5mm · 0.83mm/px · 1 of 20 slices shown]
[im 1/20]
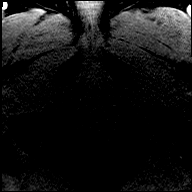

[Series 12: T2 · axial · 1.2mm · 1.20mm/px · z∈[-45,+41]mm · 2 of 72 slices shown (4 of 4)]
[im 1/72]
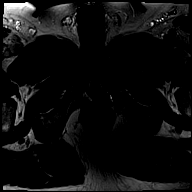
[im 72/72]
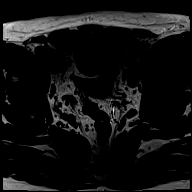

[Series 13: post t1_twist_tra_dyn-copy center · axial · non-contrast · 3.5mm · 0.83mm/px · z∈[-36,+31]mm · 16 of 600 slices shown]
[im 1/600]
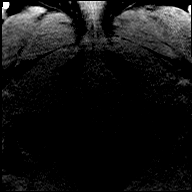
[im 40/600]
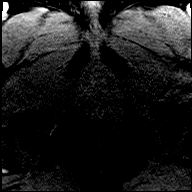
[im 80/600]
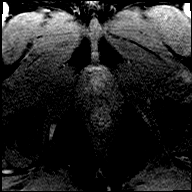
[im 120/600]
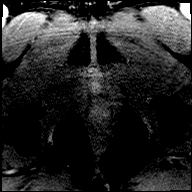
[im 160/600]
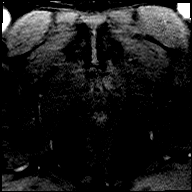
[im 200/600]
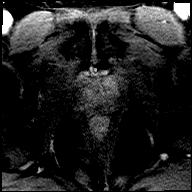
[im 240/600]
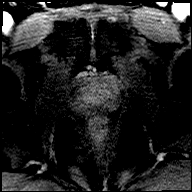
[im 280/600]
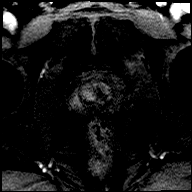
[im 320/600]
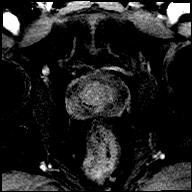
[im 360/600]
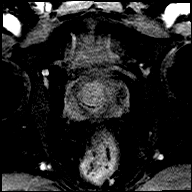
[im 400/600]
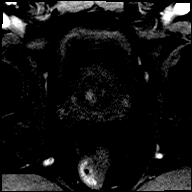
[im 440/600]
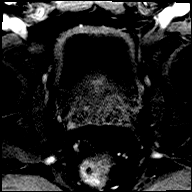
[im 480/600]
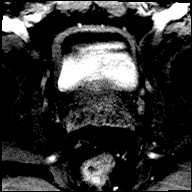
[im 520/600]
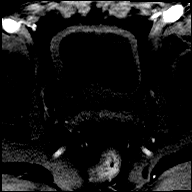
[im 560/600]
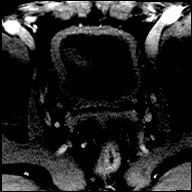
[im 600/600]
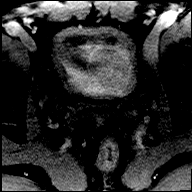

[Series 14: post t1_twist_tra_dyn-copy cent_sub · axial · 3.5mm · 0.83mm/px · z∈[-36,+31]mm · 16 of 580 slices shown]
[im 1/580]
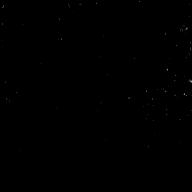
[im 39/580]
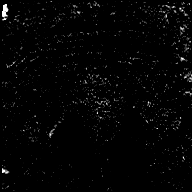
[im 78/580]
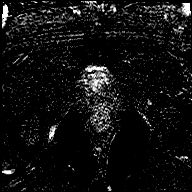
[im 116/580]
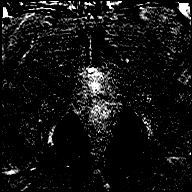
[im 155/580]
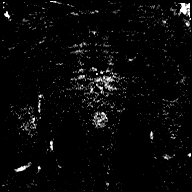
[im 194/580]
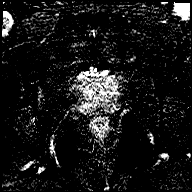
[im 232/580]
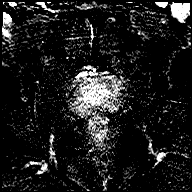
[im 271/580]
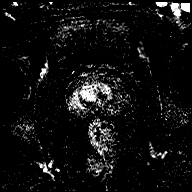
[im 309/580]
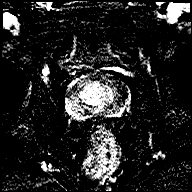
[im 348/580]
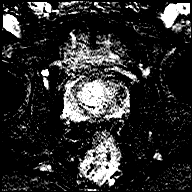
[im 387/580]
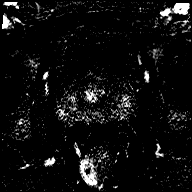
[im 425/580]
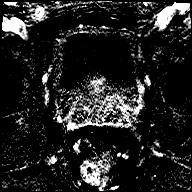
[im 464/580]
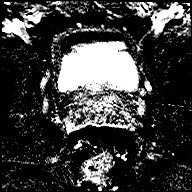
[im 502/580]
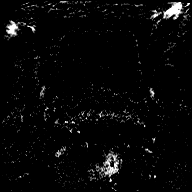
[im 541/580]
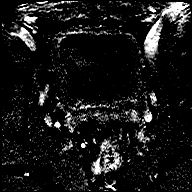
[im 580/580]
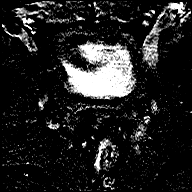

[Series 15: t1_vibe_dixon_tra_f · axial · 2.5mm · 0.91mm/px · z∈[-67,+131]mm · 2 of 80 slices shown]
[im 1/80]
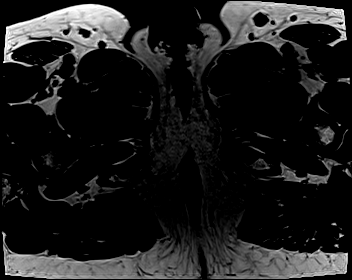
[im 80/80]
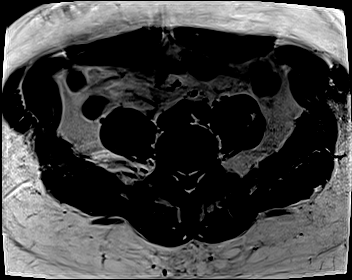

[Series 16: t1_vibe_dixon_tra_w · axial · 2.5mm · 0.91mm/px · z∈[-67,+131]mm · 2 of 80 slices shown]
[im 1/80]
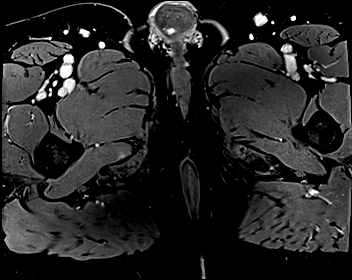
[im 80/80]
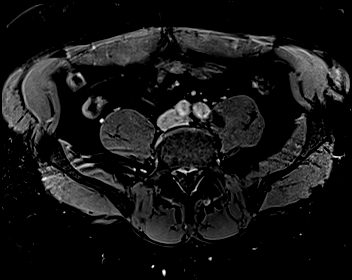

[48 of 48 positions shown; findings below may reference images not displayed]

FINDINGS: Prostate:

-- Peripheral Zone: Linear/wedge shaped hypointensities are noted on
ADC; however, no focal ADC hypointense or high b-value DWI
hyperintense nodules are identified.

-- Transition/Central Zone: Mildly enlarged; however, no focal
nodules with obscured margins or restricted diffusion seen.

-- Measurements/Volume:  4.0 x 3.4 x 5.5 cm (volume = 39 cm^3)

Transcapsular spread:  Absent

Seminal vesicle involvement:  Absent

Neurovascular bundle involvement:  Absent

Pelvic adenopathy: None visualized

Bone metastasis: None visualized

Other: Mild diffuse bladder wall thickening, likely due to chronic
bladder outlet obstruction.
IMPRESSION: No radiographic evidence of high-grade prostate carcinoma. PI-RADS
2: Low (clinically significant cancer is unlikely to be present)

## 2022-11-07 ENCOUNTER — Ambulatory Visit (INDEPENDENT_AMBULATORY_CARE_PROVIDER_SITE_OTHER): Admitting: Neurology

## 2022-11-07 DIAGNOSIS — I440 Atrioventricular block, first degree: Secondary | ICD-10-CM

## 2022-11-07 DIAGNOSIS — I4510 Unspecified right bundle-branch block: Secondary | ICD-10-CM

## 2022-11-07 DIAGNOSIS — G4733 Obstructive sleep apnea (adult) (pediatric): Secondary | ICD-10-CM | POA: Diagnosis not present

## 2022-11-07 DIAGNOSIS — Z8659 Personal history of other mental and behavioral disorders: Secondary | ICD-10-CM

## 2022-11-07 DIAGNOSIS — E1122 Type 2 diabetes mellitus with diabetic chronic kidney disease: Secondary | ICD-10-CM

## 2022-11-07 DIAGNOSIS — I10 Essential (primary) hypertension: Secondary | ICD-10-CM

## 2022-11-07 DIAGNOSIS — N183 Chronic kidney disease, stage 3 unspecified: Secondary | ICD-10-CM

## 2022-11-13 ENCOUNTER — Other Ambulatory Visit: Payer: Self-pay | Admitting: Internal Medicine

## 2022-11-20 ENCOUNTER — Other Ambulatory Visit: Payer: Self-pay | Admitting: Neurology

## 2022-11-20 NOTE — Procedures (Signed)
SPLIT NIGHT INTERPRETATION REPORT   STUDY DATE: 11/07/2022     PATIENT NAME:  Carlos [name], Carlos [name]         DATE OF BIRTH:  07-Nov-1960  PATIENT ID:  [id number]    TYPE OF STUDY:  SPLIT  READING PHYSICIAN: Melvyn Novas, MD Oneta Rack , MD  SCORING TECHNICIAN: Domingo Cocking   INDICATIONS: This patient is using a 60 year old CPAP, REMSTAR machine. The Epworth Sleepiness Scale was endorsed at 11 out of 24 (scores above or equal to 10 are suggestive of hypersomnolence).  DESCRIPTION: A sleep technologist was in attendance for the duration of the recording.  Data collection, scoring, video monitoring, and reporting were performed in compliance with the AASM Manual for the Scoring of Sleep and Associated Events; (Hypopnea is scored based on the criteria listed in Section VIII D. 1b in the AASM Manual V2.6 using a 4% oxygen desaturation rule or Hypopnea is scored based on the criteria listed in Section VIII D. 1a in the AASM Manual V2.6 using 3% oxygen desaturation and /or arousal rule).  A physician certified by the American Board of Sleep Medicine reviewed each epoch of the study.  ADDITIONAL INFORMATION:  Height: 74.0 in Weight: 309 lb (BMI 39) Neck Size: 18.5 in Medications: Aspirin, Vitamin D3, Cinnamon, Amaryl, Lantus Solostar, Zestril, Glucophage, Ozempic, Viagra, Flomax, Lamisil, Ultram  FINDINGS:  Please refer to the attached summary for additional quantitative information.  STUDY DETAILS: Lights off was at 22:09: and lights on 05:01: (411 minutes hours in bed). This study was performed with an initial diagnostic portion followed by positive airway pressure titration.  DIAGNOSTIC ANALYSIS PART 1   SLEEP CONTINUITY AND SLEEP ARCHITECTURE:  The diagnostic portion of the study began at 22:09 and ended at 00:26, for a recording time of 2h 16.61m minutes.  Total sleep time was 126 minutes (100.0% supine;  0.0% lateral;  0.0% prone, 11.1% REM sleep), with a normal sleep efficiency at 92.7%.  Sleep latency  was normal at 7.0 minutes.  REM sleep latency was normal at 53.0 minutes.  . Of the total sleep time, the percentage of stage N1 sleep was 2.0%, stage N2 sleep was 87.0%, stage N3 sleep was 0.0%, and REM sleep was 11.1%. There were 2 Stage R periods observed during this portion of the study, 2 awakenings (i.e. transitions to Stage W from any sleep stage), and 8.0 total stage transitions. Wake after sleep onset (WASO) time accounted for 03 minutes.   AROUSAL (Baseline): There were 18.0 arousals in total, for an arousal index of 8.5 arousals/hour.  Of these, 13.0 were identified as respiratory-related arousals (6.2 /hr), 0 were PLM-related arousals (0.0 /hr), and 7 were non-specific arousals (3.3 /hr)   RESPIRATORY MONITORING:  Based on CMS criteria (using a 4% oxygen desaturation rule for scoring hypopneas), there were 25 apneas (25 obstructive; 0 central; 0 mixed), and 90 hypopneas.  Apnea index was 10.9. Hypopnea index was 41.7. The apnea-hypopnea index was 52.6 overall (52.6 supine; 85.7 REM, 85.7 supine REM). There were 0 respiratory effort-related arousals (RERAs).  The RERA index was 0.0 events/hr.   Based on AASM criteria (using a 3% oxygen desaturation and /or arousal rule for scoring hypopneas), there were 25 apneas (25 obstructive; 0 central; 0 mixed), and 90 hypopneas.  Apnea index was 10.9. Hypopnea index was 44.6. The apnea-hypopnea index was 55.5 overall (55.5 supine; 85.7 REM, 85.7 supine REM). There were 0 respiratory effort-related arousals (RERAs).    Respiratory events were associated with oxyhemoglobin desaturations (nadir  74%) from a normal baseline (mean 95%). Total time spent at, or below 88% was 19.5 minutes, or 15.4%  of total sleep time. Snoring was absent. There were 0.0 occurrences of Cheyne Stokes breathing.   LIMB MOVEMENTS: There were 0 periodic limb movements of sleep (0.0/hr), of which 0 (0.0/hr) were associated with an arousal.   OXIMETRY: Total sleep time spent at, or  below 88% was 19.5 minutes, or 15.4% of total sleep time.    BODY POSITION: Duration of total sleep and percent of total sleep in their respective position is as follows: supine 126 minutes (100.0%),   Analysis of electrocardiogram activity showed the highest heart rate for the baseline portion of the study was 96.0 beats per minute.  The average heart rate during sleep was 90 bpm, while the highest heart rate for the same period was 96 bpm.    TREATMENT ANALYSIS PART 2  SLEEP CONTINUITY AND SLEEP ARCHITECTURE:  The treatment portion of the study began at 00:26 and ended at 05:01, for a recording time of 4h 35.73m minutes.  Total sleep time was 274 minutes (100.0% supine and 26.6% REM sleep), with a high sleep efficiency at 99.8%. Sleep latency was decreased at 0.0 minutes. REM sleep latency was decreased at 3.5 minutes.   Of the total sleep time, the percentage of stage N1 sleep was 0.9%, stage N3 sleep was 0.0%, and REM sleep was 26.6%. There were 3 Stage R periods observed during this portion of the study, 0 awakenings (i.e. transitions to Stage W from any sleep stage), and 12.0 total stage transitions.  Wake after sleep onset (WASO) time accounted for 00 minutes.   AROUSAL: There was an arousal index of 5.2 arousals/hour.  Of these, 0.0 were identified as respiratory-related arousals (0.0 /hr), 0 were PLM-related arousals (0.0 /hr), and 18 were non-specific arousals (3.9 /hr)  RESPIRATORY MONITORING:    While on PAP therapy, based on CMS criteria, the apnea-hypopnea index was 0.9 overall (0.9 supine; 0.9 REM).   While on PAP therapy, based on AASM criteria, the apnea-hypopnea index was 1.3 overall (1.3 supine; 1.3 REM).   Respiratory events were associated with oxyhemoglobin desaturation (nadir 87.0%) from a mean of 96.0%.  Total time spent at, or below 88% was 0.1 minutes, or 0.0%  of total sleep time. There were 0.0 occurrences of Cheyne Stokes breathing.  CARDIAC: The EKG documented an  average heart rate during sleep was 88 bpm.  The maximum heart rate during sleep was 96 bpm. The maximum heart rate during recording was 96.    LIMB MOVEMENTS: There were 0 periodic limb movements of sleep (0.0/hr), of which 0 (0.0/hr) were associated with an arousal.   OXIMETRY: Total sleep time spent at, or below 88% was 0.1 minutes, or 0.0% of total sleep time.    BODY POSITION: Duration of total sleep and percent of total sleep in their respective position is as follows: supine 274 minutes (100.0%),. Total supine REM sleep time was 73 minutes  (100.0% of total REM sleep).      IMPRESSION:  This experienced CPAP user patient had a baseline AHI of 55/h and is was confirmed that he still has severe OSA, obstructive sleep apnea. He responded well to 7 cm water CPAP with an VITERA FFM in medium size.     RECOMMENDATIONS: I would like to order this machine for him- a ResMed auto- titration device CPAP, settings between 5 and 10 cm water, 1 cm EPR. Vitera FFM in medium size.  Melvyn Novas, MD             CPAP/Bilevel Report    General Information  Name: Jaquil, Todt BMI: 39 Physician: ,   ID: [id number] Height: 74 in Technician: Domingo Cocking  Sex: Male Weight: 309 lb Record: [id number]  Age: 31 [24-Feb-1961] Date: 11/07/2022 Scorer: Domingo Cocking   Recommended Settings IPAP: N/A cmH20 EPAP: N/A cmH2O AHI: N/A AHI (4%): N/A   Pressure IPAP/EPAP 00 05 07   O2 Vol 0.0 0.0 0.0  Time TRT 136.25m 23.76m 252.29m   TST 126.79m 23.45m 251.76m  Sleep Stage % Wake 1.9 2.1 0.0   % REM 11.1 84.8 21.3   % N1 2.0 6.5 0.4   % N2 87.0 8.7 78.3   % N3 0.0 0.0 0.0  Respiratory Total Events 117 4 2   Obs. Apn. 23 2 0   Mixed Apn. 0 0 0   Cen. Apn. 0 0 0   Hypopneas 94 2 2   AHI 55.49 10.43 0.48   Supine AHI 55.49 10.43 0.48   Prone AHI 0.00 0.00 0.00   Side AHI 0.00 0.00 0.00  Respiratory (4%) Hypopneas (4%) 88.00 1.00 1.00   AHI (4%) 52.65 7.83 0.24   Supine AHI (4%)  52.65 7.83 0.24   Prone AHI (4%) 0.00 0.00 0.00   Side AHI (4%) 0.00 0.00 0.00  Desat Profile <= 90% 43.36m 0.46m 0.84m   <= 80% 0.33m 0.69m 0.86m   <= 70% 0.38m 0.20m 0.40m   <= 60% 0.9m 0.75m 0.86m  Arousal Index Apnea 2.4 0.0 0.0   Hypopnea 3.8 0.0 0.0   LM 0.0 2.6 1.2   Spontaneous 3.3 7.8 3.6      General Information  Name: Caylen, Yardley BMI: 39.67 Physician: Melvyn Novas, MD  ID: [id number] Height: 74.0 in Technician: Domingo Cocking, RPSGT  Sex: Male Weight: 309.0 lb Record: [id number]  Age: 56 [10-06-1960] Date: 11/07/2022     Medical & Medication History    KAMARE CASPERS is a 62 y.o. male patient who is seen upon referral on 08/04/2022 from Dr Oneta Rack for a Sleep Apnea evaluation. He is using a 62 year old REMSTAR Philips machine. FFM. Its still working but he had no follow up. He was initially tested and diagnosed on [place], [place]. He was there for 5[place]5 years with the [employer]. He had been snoring loudly but his wife had not witnessed apneas. He was tested and surprised about the degree.  Aspirin, Vitamin D3, Cinnamon, Amaryl, Lantus Solostar, Zestril, Glucophage, Ozempic, Viagra, Flomax, Lamisil, Ultram   Sleep Disorder      Comments   Patient arrived for a SPLIT (at 10 AHI) polysomnogram. Procedure explained and all questions answered. Patient is a current CPAP user and wears CPAP with a medium sized full face mask. He states that his mask and CPAP equipment are quite old and in need of replacement. Standard paste setup without complications. Patient slept supine. Mild to moderate snoring was heard. Severe respiratory events observed. After two hours total sleep time (TST), AHI = 54.5. CPAP was started at 5cm/H2O, with heated humidity using a medium Vitera full face mask. CPAP increased to 8cm/H2O in an effort to control obstructive events and abolish snoring. No obvious cardiac arrhythmias noted. No significant PLMS observed. Patient had no restroom visit.   Baseline Sleep Stage  Information Baseline start time: 10:09:53 PM Baseline end time: 12:26:13 AM   Time Total Supine Side Prone Upright  Recording 2h 16.37m 2h  16.81m 0h 0.82m 0h 0.2m 0h 0.43m  Sleep 2h 6.103m 2h 6.21m 0h 0.94m 0h 0.71m 0h 0.38m   Latency N1 N2 N3 REM Onset Per. Slp. Eff.  Actual 0h 7.77m 0h 10.46m 0h 0.4m 0h 53.78m 0h 7.38m 0h 8.19m 92.67%   Stg Dur Wake N1 N2 N3 REM  Total 3.0 2.5 110.0 0.0 14.0  Supine 3.0 2.5 110.0 0.0 14.0  Side 0.0 0.0 0.0 0.0 0.0  Prone 0.0 0.0 0.0 0.0 0.0  Upright 0.0 0.0 0.0 0.0 0.0   Stg % Wake N1 N2 N3 REM  Total 2.3 2.0 87.0 0.0 11.1  Supine 2.3 2.0 87.0 0.0 11.1  Side 0.0 0.0 0.0 0.0 0.0  Prone 0.0 0.0 0.0 0.0 0.0  Upright 0.0 0.0 0.0 0.0 0.0    CPAP Sleep Stage Information CPAP start time: 12:26:13 AM CPAP end time: 05:01:15 AM   Time Total Supine Side Prone Upright  Recording (TRT) 4h 35.40m 4h 35.21m 0h 0.59m 0h 0.66m 0h 0.13m  Sleep (TST) 4h 34.38m 4h 34.64m 0h 0.10m 0h 0.80m 0h 0.56m   Latency N1 N2 N3 REM Onset Per. Slp. Eff.  Actual 0h 0.86m 0h 1.58m 0h 0.38m 0h 3.71m 0h 0.71m 0h 0.81m 99.82%   Stg Dur Wake N1 N2 N3 REM  Total 0.0 2.5 199.0 0.0 73.0  Supine 0.0 2.5 199.0 0.0 73.0  Side 0.0 0.0 0.0 0.0 0.0  Prone 0.0 0.0 0.0 0.0 0.0  Upright 0.0 0.0 0.0 0.0 0.0   Stg % Wake N1 N2 N3 REM  Total 0.0 0.9 72.5 0.0 26.6  Supine 0.0 0.9 72.5 0.0 26.6  Side 0.0 0.0 0.0 0.0 0.0  Prone 0.0 0.0 0.0 0.0 0.0  Upright 0.0 0.0 0.0 0.0 0.0    Baseline Respiratory Information Apnea Summary Sub Supine Side Prone Upright  Total 23 Total 23 23 0 0 0    REM 9 9 0 0 0    NREM 14 14 0 0 0  Obs 23 REM 9 9 0 0 0    NREM 14 14 0 0 0  Mix 0 REM 0 0 0 0 0    NREM 0 0 0 0 0  Cen 0 REM 0 0 0 0 0    NREM 0 0 0 0 0   Rera Summary Sub Supine Side Prone Upright  Total 0 Total 0 0 0 0 0    REM 0 0 0 0 0    NREM 0 0 0 0 0   Hypopnea Summary Sub Supine Side Prone Upright  Total 94 Total 94 94 0 0 0    REM 11 11 0 0 0    NREM 83 83 0 0 0   4% Hypopnea Summary Sub Supine Side Prone  Upright  Total (4%) 88 Total 88 88 0 0 0    REM 11 11 0 0 0    NREM 77 77 0 0 0     AHI Total Obs Mix Cen  55.49 Apnea 10.91 10.91 0.00 0.00   Hypopnea 44.58 -- -- --  52.65 Hypopnea (4%) 41.74 -- -- --    Total Supine Side Prone Upright  Position AHI 55.49 55.49 0.00 0.00 0.00  REM AHI 85.71   NREM AHI 51.73   Position RDI 55.49 55.49 0.00 0.00 0.00  REM RDI 85.71   NREM RDI 51.73    4% Hypopnea Total Supine Side Prone Upright  Position AHI (4%) 52.65 52.65 0.00 0.00 0.00  REM AHI (4%)  85.71   NREM AHI (4%) 48.53   Position RDI (4%) 52.65 52.65 0.00 0.00 0.00  REM RDI (4%) 85.71   NREM RDI (4%) 48.53    CPAP Respiratory Information Apnea Summary Sub Supine Side Prone Upright  Total 2 Total 2 2 0 0 0    REM 2 2 0 0 0    NREM 0 0 0 0 0  Obs 2 REM 2 2 0 0 0    NREM 0 0 0 0 0  Mix 0 REM 0 0 0 0 0    NREM 0 0 0 0 0  Cen 0 REM 0 0 0 0 0    NREM 0 0 0 0 0   Rera Summary Sub Supine Side Prone Upright  Total 0 Total 0 0 0 0 0    REM 0 0 0 0 0    NREM 0 0 0 0 0   Hypopnea Summary Sub Supine Side Prone Upright  Total 4 Total 4 4 0 0 0    REM 4 4 0 0 0    NREM 0 0 0 0 0   4% Hypopnea Summary Sub Supine Side Prone Upright  Total (4%) 2 Total 2 2 0 0 0    REM 2 2 0 0 0    NREM 0 0 0 0 0     AHI Total Obs Mix Cen  1.31 Apnea 0.44 0.44 0.00 0.00   Hypopnea 0.87 -- -- --  0.87 Hypopnea (4%) 0.44 -- -- --    Total Supine Side Prone Upright  Position AHI 1.31 1.31 0.00 0.00 0.00  REM AHI 4.93   NREM AHI 0.00   Position RDI 1.31 1.31 0.00 0.00 0.00  REM RDI 4.93   NREM RDI 0.00    4% Hypopnea Total Supine Side Prone Upright  Position AHI (4%) 0.87 0.87 0.00 0.00 0.00  REM AHI (4%) 3.29   NREM AHI (4%) 0.00   Position RDI (4%) 0.87 0.87 0.00 0.00 0.00  REM RDI (4%) 3.29   NREM RDI (4%) 0.00    Desaturation Information (Baseline)  <100% <90% <80% <70% <60% <50% <40%  Supine 102 67 1 0 0 0 0  Side 0 0 0 0 0 0 0  Prone 0 0 0 0 0 0 0  Upright 0 0 0 0 0 0 0   Total 102 67 1 0 0 0 0  Desaturation threshold setting: 4% Minimum desaturation setting: 10 seconds SaO2 nadir: 74% The longest event was a 36 sec obstructive Hypopneawith a minimum SaO2 of 87%. The lowest SaO2 was 74% associated with a 26 sec obstructive Apnea. Awakening/Arousal Information (Baseline) # of Awakenings 2  Wake after sleep onset 3.31m  Wake after persistent sleep 2.79m   Arousal Assoc. Arousals Index  Apneas 5 2.4  Hypopneas 8 3.8  Leg Movements 0 0.0  Snore 0.0 0.0  PTT Arousals 0 0.0  Spontaneous 7 3.3  Total 20 9.5   Desaturation Information (CPAP)  <100% <90% <80% <70% <60% <50% <40%  Supine 5 1 0 0 0 0 0  Side 0 0 0 0 0 0 0  Prone 0 0 0 0 0 0 0  Upright 0 0 0 0 0 0 0  Total 5 1 0 0 0 0 0  Desaturation threshold setting: 4% Minimum desaturation setting: 10 seconds SaO2 nadir: 87% The longest event was a 20 sec obstructive Hypopnea with a minimum SaO2 of 94%. The lowest SaO2  was 87% associated with a 17 sec obstructive Apnea. Awakening/Arousal Information (CPAP) # of Awakenings 0  Wake after sleep onset 0.76m  Wake after persistent sleep 0.33m   Arousal Assoc. Arousals Index  Apneas 0 0.0  Hypopneas 0 0.0  Leg Movements 6 1.3  Snore 0.0 0.0  PTT Arousals 0 0.0  Spontaneous 18 3.9  Total 24 5.2     EKG Rates (Baseline) EKG Avg Max Min  Awake 90 94 86  Asleep 90 96 83  EKG Events: Tachycardia Myoclonus Information (Baseline) PLMS LMs Index  Total LMs during PLMS 0 0.0  LMs w/ Microarousals 0 0.0   LM LMs Index  w/ Microarousal 0 0.0  w/ Awakening 0 0.0  w/ Resp Event 0 0.0  Spontaneous 1 0.5  Total 1 0.5   EKG Rates (CPAP) EKG Avg Max Min  Awake 0 0 0  Asleep 88 95 80  EKG Events: Tachycardia Myoclonus Information (CPAP) PLMS LMs Index  Total LMs during PLMS 0 0.0  LMs w/ Microarousals 0 0.0   LM LMs Index  w/ Microarousal 6 1.3  w/ Awakening 0 0.0  w/ Resp Event 0 0.0  Spontaneous 8 1.7  Total 14 3.1     [facility] Summary    General Information  Name: Deontre, Allsup BMI: 39.67 Physician: Melvyn Novas, MD  ID: [id number] Height: 74.0 in Technician: Domingo Cocking, RPSGT  Sex: Male Weight: 309.0 lb Record: [id number]  Age: 6 [01/09/1961] Date: 11/07/2022     Medical & Medication History    CONSTANTIN HILLERY is a 62 y.o. male patient who is seen upon referral on 08/04/2022 from Dr Oneta Rack for a Sleep Apnea evaluation. He is using a 62 year old REMSTAR Philips machine. FFM. Its still working but he had no follow up. He was initially tested and diagnosed on [place], [place]. He was there for 5.5 years with the [employer]. He had been snoring loudly but his wife had not witnessed apneas. He was tested and surprised about the degree.  Aspirin, Vitamin D3, Cinnamon, Amaryl, Lantus Solostar, Zestril, Glucophage, Ozempic, Viagra, Flomax, Lamisil, Ultram   Sleep Disorder      Comments   Patient arrived for a SPLIT (at 10 AHI) polysomnogram. Procedure explained and all questions answered. Patient is a current CPAP user and wears CPAP with a medium sized full face mask. He states that his mask and CPAP equipment are quite old and in need of replacement. Standard paste setup without complications. Patient slept supine. Mild to moderate snoring was heard. Severe respiratory events observed. After two hours total sleep time (TST), AHI = 54.5. CPAP was started at 5cm/H2O, with heated humidity using a medium Vitera full face mask. CPAP increased to 8cm/H2O in an effort to control obstructive events and abolish snoring. No obvious cardiac arrhythmias noted. No significant PLMS observed. Patient had no restroom visit.   Baseline Sleep Stage Information Baseline start time: 10:09:53 PM Baseline end time: 12:26:13 AM   Time Total Supine Side Prone Upright  Recording 2h 16.98m 2h 16.49m 0h 0.61m 0h 0.13m 0h 0.5m  Sleep 2h 6.62m 2h 6.21m 0h 0.51m 0h 0.8m 0h 0.30m   Latency N1 N2 N3 REM Onset Per. Slp. Eff.  Actual 0h  7.26m 0h 10.81m 0h 0.61m 0h 53.74m 0h 7.51m 0h 8.58m 92.67%   Stg Dur Wake N1 N2 N3 REM  Total 3.0 2.5 110.0 0.0 14.0  Supine 3.0 2.5 110.0 0.0 14.0  Side 0.0 0.0 0.0 0.0  0.0  Prone 0.0 0.0 0.0 0.0 0.0  Upright 0.0 0.0 0.0 0.0 0.0   Stg % Wake N1 N2 N3 REM  Total 2.3 2.0 87.0 0.0 11.1  Supine 2.3 2.0 87.0 0.0 11.1  Side 0.0 0.0 0.0 0.0 0.0  Prone 0.0 0.0 0.0 0.0 0.0  Upright 0.0 0.0 0.0 0.0 0.0    CPAP Sleep Stage Information CPAP start time: 12:26:13 AM CPAP end time: 05:01:15 AM   Time Total Supine Side Prone Upright  Recording (TRT) 4h 35.23m 4h 35.75m 0h 0.61m 0h 0.9m 0h 0.19m  Sleep (TST) 4h 34.39m 4h 34.65m 0h 0.33m 0h 0.65m 0h 0.105m   Latency N1 N2 N3 REM Onset Per. Slp. Eff.  Actual 0h 0.42m 0h 1.4m 0h 0.82m 0h 3.14m 0h 0.2m 0h 0.60m 99.82%   Stg Dur Wake N1 N2 N3 REM  Total 0.0 2.5 199.0 0.0 73.0  Supine 0.0 2.5 199.0 0.0 73.0  Side 0.0 0.0 0.0 0.0 0.0  Prone 0.0 0.0 0.0 0.0 0.0  Upright 0.0 0.0 0.0 0.0 0.0   Stg % Wake N1 N2 N3 REM  Total 0.0 0.9 72.5 0.0 26.6  Supine 0.0 0.9 72.5 0.0 26.6  Side 0.0 0.0 0.0 0.0 0.0  Prone 0.0 0.0 0.0 0.0 0.0  Upright 0.0 0.0 0.0 0.0 0.0    Baseline Respiratory Information Apnea Summary Sub Supine Side Prone Upright  Total 23 Total 23 23 0 0 0    REM 9 9 0 0 0    NREM 14 14 0 0 0  Obs 23 REM 9 9 0 0 0    NREM 14 14 0 0 0  Mix 0 REM 0 0 0 0 0    NREM 0 0 0 0 0  Cen 0 REM 0 0 0 0 0    NREM 0 0 0 0 0   Rera Summary Sub Supine Side Prone Upright  Total 0 Total 0 0 0 0 0    REM 0 0 0 0 0    NREM 0 0 0 0 0   Hypopnea Summary Sub Supine Side Prone Upright  Total 94 Total 94 94 0 0 0    REM 11 11 0 0 0    NREM 83 83 0 0 0   4% Hypopnea Summary Sub Supine Side Prone Upright  Total (4%) 88 Total 88 88 0 0 0    REM 11 11 0 0 0    NREM 77 77 0 0 0     AHI Total Obs Mix Cen  55.49 Apnea 10.91 10.91 0.00 0.00   Hypopnea 44.58 -- -- --  52.65 Hypopnea (4%) 41.74 -- -- --    Total Supine Side Prone Upright  Position AHI 55.49 55.49 0.00  0.00 0.00  REM AHI 85.71   NREM AHI 51.73   Position RDI 55.49 55.49 0.00 0.00 0.00  REM RDI 85.71   NREM RDI 51.73    4% Hypopnea Total Supine Side Prone Upright  Position AHI (4%) 52.65 52.65 0.00 0.00 0.00  REM AHI (4%) 85.71   NREM AHI (4%) 48.53   Position RDI (4%) 52.65 52.65 0.00 0.00 0.00  REM RDI (4%) 85.71   NREM RDI (4%) 48.53    CPAP Respiratory Information Apnea Summary Sub Supine Side Prone Upright  Total 2 Total 2 2 0 0 0    REM 2 2 0 0 0    NREM 0 0 0 0 0  Obs 2 REM 2 2  0 0 0    NREM 0 0 0 0 0  Mix 0 REM 0 0 0 0 0    NREM 0 0 0 0 0  Cen 0 REM 0 0 0 0 0    NREM 0 0 0 0 0   Rera Summary Sub Supine Side Prone Upright  Total 0 Total 0 0 0 0 0    REM 0 0 0 0 0    NREM 0 0 0 0 0   Hypopnea Summary Sub Supine Side Prone Upright  Total 4 Total 4 4 0 0 0    REM 4 4 0 0 0    NREM 0 0 0 0 0   4% Hypopnea Summary Sub Supine Side Prone Upright  Total (4%) 2 Total 2 2 0 0 0    REM 2 2 0 0 0    NREM 0 0 0 0 0     AHI Total Obs Mix Cen  1.31 Apnea 0.44 0.44 0.00 0.00   Hypopnea 0.87 -- -- --  0.87 Hypopnea (4%) 0.44 -- -- --    Total Supine Side Prone Upright  Position AHI 1.31 1.31 0.00 0.00 0.00  REM AHI 4.93   NREM AHI 0.00   Position RDI 1.31 1.31 0.00 0.00 0.00  REM RDI 4.93   NREM RDI 0.00    4% Hypopnea Total Supine Side Prone Upright  Position AHI (4%) 0.87 0.87 0.00 0.00 0.00  REM AHI (4%) 3.29   NREM AHI (4%) 0.00   Position RDI (4%) 0.87 0.87 0.00 0.00 0.00  REM RDI (4%) 3.29   NREM RDI (4%) 0.00    Desaturation Information (Baseline)  <100% <90% <80% <70% <60% <50% <40%  Supine 102 67 1 0 0 0 0  Side 0 0 0 0 0 0 0  Prone 0 0 0 0 0 0 0  Upright 0 0 0 0 0 0 0  Total 102 67 1 0 0 0 0  Desaturation threshold setting: 4% Minimum desaturation setting: 10 seconds SaO2 nadir: 74% The longest event was a 36 sec obstructive Hypopneawith a minimum SaO2 of 87%. The lowest SaO2 was 74% associated with a 26 sec obstructive  Apnea. Awakening/Arousal Information (Baseline) # of Awakenings 2  Wake after sleep onset 3.79m  Wake after persistent sleep 2.65m   Arousal Assoc. Arousals Index  Apneas 5 2.4  Hypopneas 8 3.8  Leg Movements 0 0.0  Snore 0.0 0.0  PTT Arousals 0 0.0  Spontaneous 7 3.3  Total 20 9.5   Desaturation Information (CPAP)  <100% <90% <80% <70% <60% <50% <40%  Supine 5 1 0 0 0 0 0  Side 0 0 0 0 0 0 0  Prone 0 0 0 0 0 0 0  Upright 0 0 0 0 0 0 0  Total 5 1 0 0 0 0 0  Desaturation threshold setting: 4% Minimum desaturation setting: 10 seconds SaO2 nadir: 87% The longest event was a 20 sec obstructive Hypopnea with a minimum SaO2 of 94%. The lowest SaO2 was 87% associated with a 17 sec obstructive Apnea. Awakening/Arousal Information (CPAP) # of Awakenings 0  Wake after sleep onset 0.26m  Wake after persistent sleep 0.15m   Arousal Assoc. Arousals Index  Apneas 0 0.0  Hypopneas 0 0.0  Leg Movements 6 1.3  Snore 0.0 0.0  PTT Arousals 0 0.0  Spontaneous 18 3.9  Total 24 5.2     EKG Rates (Baseline) EKG Avg Max  Min  Awake 90 94 86  Asleep 90 96 83  EKG Events: Tachycardia Myoclonus Information (Baseline) PLMS LMs Index  Total LMs during PLMS 0 0.0  LMs w/ Microarousals 0 0.0   LM LMs Index  w/ Microarousal 0 0.0  w/ Awakening 0 0.0  w/ Resp Event 0 0.0  Spontaneous 1 0.5  Total 1 0.5   EKG Rates (CPAP) EKG Avg Max Min  Awake 0 0 0  Asleep 88 95 80  EKG Events: Tachycardia Myoclonus Information (CPAP) PLMS LMs Index  Total LMs during PLMS 0 0.0  LMs w/ Microarousals 0 0.0   LM LMs Index  w/ Microarousal 6 1.3  w/ Awakening 0 0.0  w/ Resp Event 0 0.0  Spontaneous 8 1.7  Total 14 3.1    <

## 2022-11-20 NOTE — Addendum Note (Signed)
Addended by: Melvyn Novas on: 11/20/2022 06:20 PM   Modules accepted: Orders

## 2022-11-22 ENCOUNTER — Telehealth: Payer: Self-pay | Admitting: Neurology

## 2022-11-22 NOTE — Telephone Encounter (Signed)
I called pt. I advised pt that Dr. Vickey Huger reviewed their sleep study results and found that pt severe OSA. Dr. Vickey Huger recommends that pt start an auto CPAP. I reviewed PAP compliance expectations with the pt. Pt is agreeable to starting a CPAP. I advised pt that an order will be sent to a DME, [facility], and [facility] will call the pt within about one week after they file with the pt's insurance. antihistamine will show the pt how to use the machine, fit for masks, and troubleshoot the CPAP if needed. A follow up appt was made for insurance purposes with Shawnie Dapper, NP on 10/10 at 8 am with Shawnie Dapper, NP. Pt verbalized understanding to arrive 15 minutes early and bring their CPAP. Pt verbalized understanding of results. Pt had no questions at this time but was encouraged to call back if questions arise. I have sent the order to [facility] and have received confirmation that they have received the order.

## 2022-11-22 NOTE — Telephone Encounter (Signed)
-----   Message from Lacy-Lakeview Dohmeier sent at 11/20/2022  6:20 PM EDT ----- This experienced CPAP user patient had a baseline AHI of 55/h and is was confirmed that he still has severe OSA, obstructive sleep apnea. He responded well to 7 cm water CPAP with an VITERA FFM in medium size.      RECOMMENDATIONS: I would like to order this machine for him- a ResMed auto- titration device CPAP, settings between 5 and 10 cm water, 1 cm EPR. Vitera FFM in medium size.

## 2022-12-19 NOTE — Telephone Encounter (Signed)
Pt called, contacted [facility], they have not received order for CPAP machine. Would like a call back.

## 2022-12-19 NOTE — Telephone Encounter (Signed)
I have sent this message to [facility]. The orderw as sent on 11/25/2022 to [facility]. I will have [facility] follow up on the status.

## 2022-12-26 ENCOUNTER — Ambulatory Visit (INDEPENDENT_AMBULATORY_CARE_PROVIDER_SITE_OTHER): Admitting: Nurse Practitioner

## 2022-12-26 ENCOUNTER — Encounter: Payer: Self-pay | Admitting: Nurse Practitioner

## 2022-12-26 VITALS — BP 130/80 | HR 94 | Temp 97.6°F | Ht 74.0 in | Wt 305.8 lb

## 2022-12-26 DIAGNOSIS — G4733 Obstructive sleep apnea (adult) (pediatric): Secondary | ICD-10-CM

## 2022-12-26 DIAGNOSIS — E559 Vitamin D deficiency, unspecified: Secondary | ICD-10-CM

## 2022-12-26 DIAGNOSIS — I10 Essential (primary) hypertension: Secondary | ICD-10-CM

## 2022-12-26 DIAGNOSIS — N521 Erectile dysfunction due to diseases classified elsewhere: Secondary | ICD-10-CM

## 2022-12-26 DIAGNOSIS — E785 Hyperlipidemia, unspecified: Secondary | ICD-10-CM

## 2022-12-26 DIAGNOSIS — E11319 Type 2 diabetes mellitus with unspecified diabetic retinopathy without macular edema: Secondary | ICD-10-CM

## 2022-12-26 DIAGNOSIS — E1169 Type 2 diabetes mellitus with other specified complication: Secondary | ICD-10-CM

## 2022-12-26 DIAGNOSIS — Z79899 Other long term (current) drug therapy: Secondary | ICD-10-CM

## 2022-12-26 DIAGNOSIS — N183 Chronic kidney disease, stage 3 unspecified: Secondary | ICD-10-CM

## 2022-12-26 DIAGNOSIS — Z87442 Personal history of urinary calculi: Secondary | ICD-10-CM

## 2022-12-26 DIAGNOSIS — E1122 Type 2 diabetes mellitus with diabetic chronic kidney disease: Secondary | ICD-10-CM

## 2022-12-26 DIAGNOSIS — E349 Endocrine disorder, unspecified: Secondary | ICD-10-CM

## 2022-12-26 NOTE — Progress Notes (Signed)
Follow Up  Assessment and Plan:  Essential hypertension Discussed DASH (Dietary Approaches to Stop Hypertension) DASH diet is lower in sodium than a typical American diet. Cut back on foods that are high in saturated fat, cholesterol, and trans fats. Eat more whole-grain foods, fish, poultry, and nuts Remain active and exercise as tolerated daily.  Monitor BP at home-Call if greater than 130/80.  Check CMP/CBC  Type 2 diabetes mellitus with diabetic nephropathy (HCC) Continue glimepiride, ozempic glargine 40 units.  Discussed increase in ozempic next month after completion of current Rx packet. Education: Reviewed 'ABCs' of diabetes management  Discussed goals to be met and/or maintained include A1C (<7) Blood pressure (<130/80) Cholesterol (LDL <70) Continue Eye Exam yearly  Continue Dental Exam Q6 mo Discussed dietary recommendations Discussed Physical Activity recommendations Check A1C  Hyperlipidemia associated with type 2 diabetes mellitus (HCC) Discussed lifestyle modifications. Recommended diet heavy in fruits and veggies, omega 3's. Decrease consumption of animal meats, cheeses, and dairy products. Remain active and exercise as tolerated. Continue to monitor. Check lipids/TSH  CKD stage 3 due to type 2 diabetes mellitus (HCC)/ Microalbuminuria (HCC) Discussed how what you eat and drink can aide in kidney protection. Stay well hydrated. Avoid high salt foods. Avoid NSAIDS. Keep BP and BG well controlled.   Take medications as prescribed. Remain active and exercise as tolerated daily. Maintain weight.  Continue to monitor. Check CMP/GFR/Microablumin  Erectile dysfunction associated with type 2 diabetes mellitus (HCC) Continue Sildenafil Suggest Zinc supplement Continue to monitor  Morbid obesity (HCC) - BMI 40 with OSA Weight trending down. Discussed appropriate BMI Diet modification. Physical activity. Encouraged/praised to build  confidence.  Hereditary hemochromatosis (HCC) Monitor labs, check CBC/iron/ferritin  OSA on CPAP Continue CPAP compliance  Medication management All medications discussed and reviewed in full. All questions and concerns regarding medications addressed.    Vitamin D deficiency Continue supplement for goal of 60-100 Monitor Vitamin D levels  Testosterone deficiency Continue to monitor  History of nephrolithiasis Controlled Increase water intake, urology follows  Orders Placed This Encounter  Procedures   CBC with Differential/Platelet   COMPLETE METABOLIC PANEL WITH GFR   Lipid panel   Hemoglobin A1c    Notify office for further evaluation and treatment, questions or concerns if any reported s/s fail to improve.   The patient was advised to call back or seek an in-person evaluation if any symptoms worsen or if the condition fails to improve as anticipated.   Further disposition pending results of labs. Discussed med's effects and SE's.    I discussed the assessment and treatment plan with the patient. The patient was provided an opportunity to ask questions and all were answered. The patient agreed with the plan and demonstrated an understanding of the instructions.  Discussed med's effects and SE's. Screening labs and tests as requested with regular follow-up as recommended.  I provided 25 minutes of face-to-face time during this encounter including counseling, chart review, and critical decision making was preformed.  Today's Plan of Care is based on a patient-centered health care approach known as shared decision making - the decisions, tests and treatments allow for patient preferences and values to be balanced with clinical evidence.     Future Appointments  Date Time Provider Department Center  02/09/2023  8:00 AM Shawnie Dapper, NP [facility] None  06/06/2023  9:00 AM Shonda Mandarino, Archie Patten, NP [facility] None   HPI 62 y.o. AA male  presents for a general follow up. He has  Essential hypertension; T2_NIDDM with CKD  III (HCC); Hyperlipidemia associated with type 2 diabetes mellitus (HCC); Testosterone deficiency; Vitamin D deficiency; Medication management; OSA (obstructive sleep apnea); History of nephrolithiasis; Morbid obesity (HCC); Hemochromatosis; Erectile dysfunction associated with type 2 diabetes mellitus (HCC); FH: hypertension; CKD stage 3 due to type 2 diabetes mellitus (HCC); Prostate cancer (HCC); Diabetic retinopathy associated with type 2 diabetes mellitus (HCC); RBBB; AV block, 1st degree; Microalbuminuria due to type 2 diabetes mellitus (HCC); and History of posttraumatic stress disorder (PTSD) on their problem list.   He is married, had 3 children, 1 passed with GYN cancer in 2021. Has 2 grandchildren. He works as Nurse, children's.   Overall he reports feeling well today.  He has no new concerns at this time.  He has OSA on CPAP, reports 90% compliance with restorative sleep.   BMI is Body mass index is 39.26 kg/m., he has been working on diet and exercise. Wt Readings from Last 3 Encounters:  12/26/22 (!) 305 lb 12.8 oz (138.7 kg)  09/19/22 (!) 310 lb 6.4 oz (140.8 kg)  08/04/22 (!) 309 lb 3.2 oz (140.3 kg)   His blood pressure is not checked at home, he is on lisinopril 20 mg, today their BP is BP: 130/80, last check was 100/70 He does workout.  He denies chest pain, shortness of breath, dizziness.   He has not been working on diet and exercise for diabetes, long hx of poorly controlled A1C on review with patient today  with CKD III he is on lisinopril  Hyperlipidemia not statin, cholesterol IS at goal less than 70, declines  ED, has viagra He is on ASA MF 2000 mg a day total He is on lantus 40 units Ozempic 1 mg weekly He is on amaryl 4 mg 1 pill BID Eye exam: Dr. Hyacinth Meeker 2023 with retinopathy Meter: Freestyle Reports fasting unknown denies paresthesia of the feet, polydipsia and polyuria.  Last A1C in the office was and is  slow trending down Lab Results  Component Value Date   HGBA1C 7.9 (H) 09/19/2022   Lab Results  Component Value Date   GFRAA 55 (L) 04/30/2020   Lab Results  Component Value Date   CHOL 138 09/19/2022   HDL 52 09/19/2022   LDLCALC 61 09/19/2022   TRIG 176 (H) 09/19/2022   CHOLHDL 2.7 09/19/2022   Patient is on Vitamin D supplement, taking 44010 IU daily Lab Results  Component Value Date   VD25OH 20 09/19/2022   He is has a history of testosterone deficiency and is on testosterone replacement at this time, only on 2 pumps each arm a day He states that the testosterone is help with his energy, libido, muscle mass.  Lab Results  Component Value Date   TESTOSTERONE 327 05/06/2021   Has history of kidney stone, recently elevated PSAs up to 11+, follows with Dr. Marlou Porch. Per his note in 11/2020 patient had MRI showing PI-RADS 2, monitoring.  Lab Results  Component Value Date   PSA 6.65 (H) 04/30/2020   PSA 4.1 (H) 05/01/2019   PSA 3.8 06/05/2018   Hx of hemochromatosis, currently monitoring only, did see hematologist, switching off of testosterone injections helped.  Lab Results  Component Value Date   IRON 89 05/30/2022   TIBC 222 (L) 05/30/2022   FERRITIN 338 05/30/2022      Latest Ref Rng & Units 09/19/2022   12:15 PM 05/30/2022    3:13 PM 12/16/2021   10:38 AM  CBC  WBC 3.8 - 10.8 Thousand/uL 5.0  4.6    Hemoglobin 13.2 - 17.1 g/dL 17.5  10.2  58.5   Hematocrit 38.5 - 50.0 % 44.6  42.4  44.0   Platelets 140 - 400 Thousand/uL 230  247        Current Medications:   Current Outpatient Medications (Endocrine & Metabolic):    glimepiride (AMARYL) 4 MG tablet, Take 1 tablet 2 x /day with Meals for Diabetes (Patient taking differently: Take 4 mg by mouth 2 (two) times daily. Take 1 tablet 2 x /day with Meals for Diabetes)   insulin glargine (LANTUS SOLOSTAR) 100 UNIT/ML Solostar Pen, Inject 40 Units into the skin daily. INJECT 30 TO 40 UNITS DAILY FOR DIABETES    metFORMIN (GLUCOPHAGE-XR) 500 MG 24 hr tablet, TAKE 2 TABLETS TWICE A DAY WITH MEALS FOR DIABETES (Patient taking differently: Take 1,000 mg by mouth 2 (two) times daily. TAKE 2 TABLETS TWICE A DAY WITH MEALS FOR DIABETES)   Semaglutide, 1 MG/DOSE, (OZEMPIC, 1 MG/DOSE,) 4 MG/3ML SOPN, Inject 1 mg as directed once a week.  Current Outpatient Medications (Cardiovascular):    lisinopril (ZESTRIL) 20 MG tablet, Take  1 tablet  Daily  for BP (Patient taking differently: Take 20 mg by mouth daily. Take  1 tablet  Daily  for BP)   sildenafil (VIAGRA) 100 MG tablet, Take 1/2 to 1 tablet  every Day as needed for XXXX                                                                  /                                   TAKE ONE-HALF (1/2) TO ONE TABLET DAILY AS NEEDED AS DIRECTED   Current Outpatient Medications (Analgesics):    aspirin EC 81 MG tablet, Take 81 mg by mouth daily.   traMADol (ULTRAM) 50 MG tablet, Take 1-2 tablets (50-100 mg total) by mouth every 6 (six) hours as needed for moderate pain. (Patient not taking: Reported on 05/30/2022)   Current Outpatient Medications (Other):    Cholecalciferol (VITAMIN D-3) 125 MCG (5000 UT) TABS, Take 15,000 Units by mouth daily. (Patient taking differently: Take 1 capsule by mouth 2 (two) times daily.)   CINNAMON PO, Take 2,000 mg by mouth 2 (two) times daily. Takes 2 tablets twice a day.   glucose blood (FREESTYLE LITE) test strip, CHECK BLOOD SUGAR 1 TIME A DAY-dx-E11.29.   glucose monitoring kit (FREESTYLE) monitoring kit, Check blood sugar 1 time a day-DX-E11.29   Lancets (FREESTYLE) lancets, CHECK BLOOD SUGAR 1 TIME A DAY-DX-E11.29.   terbinafine (LAMISIL) 1 % cream, Apply 1 Application topically 2 (two) times daily. For 4 weeks   tamsulosin (FLOMAX) 0.4 MG CAPS capsule, Take 1 capsule (0.4 mg total) by mouth daily. (Patient not taking: Reported on 12/29/2021)  Health Maintenance:  Immunization History  Administered Date(s) Administered   DTaP  04/02/2012   Influenza Inj Mdck Quad With Preservative 03/20/2018   Influenza Split 02/17/2014   Influenza,inj,Quad PF,6+ Mos 05/06/2021   Influenza-Unspecified 01/13/2017   PFIZER Comirnaty(Gray Top)Covid-19 Tri-Sucrose Vaccine 04/27/2021   PFIZER(Purple Top)SARS-COV-2 Vaccination 08/22/2019, 09/16/2019   PPD Test 06/05/2018   Pneumococcal Polysaccharide-23 04/02/2012  Medical History:  Past Medical History:  Diagnosis Date   Chronic kidney disease (CKD), stage III (moderate) (HCC)    Erectile dysfunction 09/11/2014   Hereditary hemochromatosis (HCC)    previously followed by dr Myna Hidalgo (hemaology) , H63D gene mutation,   hx phlebotomies and / or donate blood   History of kidney stones    Hyperlipidemia    Hypertension    Hypogonadism male    Malignant neoplasm prostate (HCC) 06/2021   urologist-- dr herrick/  radiation oncology-- dr Kathrynn Running---  dx 02/ 2023  Gleason 3+4   Neuropathy    OSA on CPAP    pt stated uses nightly   Right bundle branch block    Type 2 diabetes mellitus treated with insulin (HCC)    followed by pcp   (12-13-2021  pt stated checks blood sugar daily fasting, average 120-130)   Allergies No Known Allergies  SURGICAL HISTORY He  has a past surgical history that includes Ankle surgery (Left, 2002); Rhinoplasty (2001); Extracorporeal shock wave lithotripsy (Left, 09-25-2014); Cystoscopy/retrograde/ureteroscopy/stone extraction with basket (Left, 12/05/2014); Cystoscopy w/ retrogrades (Left, 12/05/2014); Colonoscopy (2018); Radioactive seed implant (N/A, 12/16/2021); and SPACE OAR INSTILLATION (N/A, 12/16/2021).   FAMILY HISTORY His family history includes Breast cancer (age of onset: 5) in his mother; Diabetes in his father; Heart attack in his paternal grandfather; Hypertension in his mother; Uterine cancer (age of onset: 64) in his daughter.   SOCIAL HISTORY He  reports that he has never smoked. He has never used smokeless tobacco. He reports current alcohol  use. He reports that he does not use drugs.   Review of Systems  Constitutional:  Negative for malaise/fatigue and weight loss.  HENT:  Negative for hearing loss and tinnitus.   Eyes:  Negative for blurred vision and double vision.  Respiratory:  Negative for cough, sputum production, shortness of breath and wheezing.   Cardiovascular:  Negative for chest pain, palpitations, orthopnea, claudication, leg swelling and PND.  Gastrointestinal:  Negative for abdominal pain, blood in stool, constipation, diarrhea, heartburn, melena, nausea and vomiting.  Genitourinary: Negative.   Musculoskeletal:  Negative for falls, joint pain and myalgias.  Skin:  Negative for rash.  Neurological:  Negative for dizziness, tingling, sensory change, weakness and headaches.  Endo/Heme/Allergies:  Negative for polydipsia.  Psychiatric/Behavioral: Negative.  Negative for depression, memory loss, substance abuse and suicidal ideas. The patient is not nervous/anxious and does not have insomnia.   All other systems reviewed and are negative.   Physical Exam: Estimated body mass index is 39.26 kg/m as calculated from the following:   Height as of this encounter: 6\' 2"  (1.88 m).   Weight as of this encounter: 305 lb 12.8 oz (138.7 kg). BP 130/80   Pulse 94   Temp 97.6 F (36.4 C)   Ht 6\' 2"  (1.88 m)   Wt (!) 305 lb 12.8 oz (138.7 kg)   SpO2 98%   BMI 39.26 kg/m  General Appearance: Well nourished, in no apparent distress. Eyes: PERRLA, EOMs, conjunctiva no swelling or erythema Sinuses: No Frontal/maxillary tenderness ENT/Mouth: Ext aud canals clear after removal in the office, normal light reflex with TMs without erythema after bilateral ears were cleaned in the office, bulging. Good dentition. No erythema, swelling, or exudate on post pharynx. Tonsils not swollen or erythematous. Hearing normal.  Neck: Supple, thyroid normal. No bruits Respiratory: Respiratory effort normal, BS equal bilaterally without  rales, rhonchi, wheezing or stridor. Cardio: RRR without murmurs, rubs or gallops. Brisk peripheral pulses without edema.  Chest: symmetric, with normal excursions and percussion. Abdomen: Soft, +BS. Non tender, no guarding, rebound, hernias, masses, or organomegaly. .  Lymphatics: Non tender without lymphadenopathy.  Genitourinary: defer urologist Musculoskeletal: Full ROM all peripheral extremities,5/5 strength, and normal gait.Marland Kitchen  Neuro: Cranial nerves intact, reflexes equal bilaterally. Normal muscle tone, no cerebellar symptoms. Sensation intact to monofilament.   Psych: Awake and oriented X 3, normal affect, Insight and Judgment appropriate.  Skin: dark nevus left medial foot, unchanged, approx 8 mm Warm, dry without rashes, ecchymosis  Adela Glimpse, NP 4:01 PM [facility]

## 2022-12-26 NOTE — Patient Instructions (Signed)

## 2022-12-27 LAB — LIPID PANEL
Cholesterol: 137 mg/dL (ref <200)
HDL: 48 mg/dL (ref >=40)
LDL Cholesterol (Calc): 59 mg/dL
Non-HDL Cholesterol (Calc): 89 mg/dL (ref <130)
Total CHOL/HDL Ratio: 2.9 (calc) (ref <5.0)
Triglycerides: 259 mg/dL — ABNORMAL HIGH (ref <150)

## 2022-12-27 LAB — COMPLETE METABOLIC PANEL WITH GFR
AG Ratio: 1.9 (calc) (ref 1.0–2.5)
ALT: 36 U/L (ref 9–46)
AST: 23 U/L (ref 10–35)
Albumin: 4.3 g/dL (ref 3.6–5.1)
Alkaline phosphatase (APISO): 156 U/L — ABNORMAL HIGH (ref 35–144)
BUN/Creatinine Ratio: 14 (calc) (ref 6–22)
BUN: 23 mg/dL (ref 7–25)
CO2: 27 mmol/L (ref 20–32)
Calcium: 10.1 mg/dL (ref 8.6–10.3)
Chloride: 107 mmol/L (ref 98–110)
Creat: 1.7 mg/dL — ABNORMAL HIGH (ref 0.70–1.30)
Globulin: 2.3 g/dL (ref 1.9–3.7)
Glucose, Bld: 164 mg/dL — ABNORMAL HIGH (ref 65–99)
Potassium: 4.7 mmol/L (ref 3.5–5.3)
Sodium: 141 mmol/L (ref 135–146)
Total Bilirubin: 0.7 mg/dL (ref 0.2–1.2)
Total Protein: 6.6 g/dL (ref 6.1–8.1)
eGFR: 46 mL/min/{1.73_m2} — ABNORMAL LOW (ref >=60)

## 2022-12-27 LAB — CBC WITH DIFFERENTIAL/PLATELET
Absolute Monocytes: 348 {cells}/uL (ref 200–950)
Basophils Absolute: 40 {cells}/uL (ref 0–200)
Basophils Relative: 0.7 %
Eosinophils Absolute: 251 cells/uL (ref 15–500)
Eosinophils Relative: 4.4 %
HCT: 43.6 % (ref 38.5–50.0)
Hemoglobin: 14.5 g/dL (ref 13.2–17.1)
Lymphs Abs: 2234 {cells}/uL (ref 850–3900)
MCH: 28.7 pg (ref 27.0–33.0)
MCHC: 33.3 g/dL (ref 32.0–36.0)
MCV: 86.2 fL (ref 80.0–100.0)
MPV: 11.6 fL (ref 7.5–12.5)
Monocytes Relative: 6.1 %
Neutro Abs: 2827 {cells}/uL (ref 1500–7800)
Neutrophils Relative %: 49.6 %
Platelets: 243 10*3/uL (ref 140–400)
RBC: 5.06 10*6/uL (ref 4.20–5.80)
RDW: 12.5 % (ref 11.0–15.0)
Total Lymphocyte: 39.2 %
WBC: 5.7 10*3/uL (ref 3.8–10.8)

## 2022-12-27 LAB — HEMOGLOBIN A1C
Hgb A1c MFr Bld: 8.1 %{Hb} — ABNORMAL HIGH (ref <5.7)
Mean Plasma Glucose: 186 mg/dL
eAG (mmol/L): 10.3 mmol/L

## 2023-01-25 ENCOUNTER — Other Ambulatory Visit: Payer: Self-pay

## 2023-01-25 DIAGNOSIS — B353 Tinea pedis: Secondary | ICD-10-CM

## 2023-01-25 DIAGNOSIS — N183 Chronic kidney disease, stage 3 unspecified: Secondary | ICD-10-CM

## 2023-01-25 MED ORDER — METFORMIN HCL ER 500 MG PO TB24: ORAL_TABLET | ORAL | 3 refills | Status: AC

## 2023-01-25 MED ORDER — TERBINAFINE HCL 1 % EX CREA: 1.0000 | TOPICAL_CREAM | Freq: Two times a day (BID) | CUTANEOUS | 1 refills | Status: DC
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
## 2023-02-08 ENCOUNTER — Telehealth: Payer: Self-pay

## 2023-02-08 NOTE — Telephone Encounter (Signed)
Please see Mychart message from today.

## 2023-02-09 ENCOUNTER — Ambulatory Visit: Admitting: Family Medicine

## 2023-02-09 DIAGNOSIS — G4733 Obstructive sleep apnea (adult) (pediatric): Secondary | ICD-10-CM

## 2023-02-15 ENCOUNTER — Encounter: Payer: Self-pay | Admitting: Family Medicine

## 2023-02-15 ENCOUNTER — Ambulatory Visit: Admitting: Family Medicine

## 2023-02-15 VITALS — BP 164/94 | HR 86 | Ht 74.0 in | Wt 304.0 lb

## 2023-02-15 DIAGNOSIS — G4733 Obstructive sleep apnea (adult) (pediatric): Secondary | ICD-10-CM | POA: Diagnosis not present
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
## 2023-02-15 NOTE — Patient Instructions (Addendum)
Please continue using your CPAP regularly. While your insurance requires that you use CPAP at least 4 hours each night on 70% of the nights, I recommend, that you not skip any nights and use it throughout the night if you can. Getting used to CPAP and staying with the treatment long term does take time and patience and discipline. Untreated obstructive sleep apnea when it is moderate to severe can have an adverse impact on cardiovascular health and raise her risk for heart disease, arrhythmias, hypertension, congestive heart failure, stroke and diabetes. Untreated obstructive sleep apnea causes sleep disruption, nonrestorative sleep, and sleep deprivation. This can have an impact on your day to day functioning and cause daytime sleepiness and impairment of cognitive function, memory loss, mood disturbance, and problems focussing. Using CPAP regularly can improve these symptoms.  We will update supply orders, today. I will reprint download in 4-6 weeks to evaluate compliance.   Follow up in 1 year

## 2023-03-21 ENCOUNTER — Telehealth: Payer: Self-pay | Admitting: Family Medicine

## 2023-03-21 ENCOUNTER — Encounter: Payer: Self-pay | Admitting: Family Medicine

## 2023-03-21 NOTE — Telephone Encounter (Signed)
Please attach 90 day report for review of recent compliance. TY!

## 2023-04-06 ENCOUNTER — Encounter: Payer: Self-pay | Admitting: Nurse Practitioner

## 2023-04-06 ENCOUNTER — Ambulatory Visit (INDEPENDENT_AMBULATORY_CARE_PROVIDER_SITE_OTHER): Admitting: Nurse Practitioner

## 2023-04-06 VITALS — BP 140/80 | HR 85 | Temp 98.4°F | Ht 74.0 in | Wt 303.2 lb

## 2023-04-06 DIAGNOSIS — I10 Essential (primary) hypertension: Secondary | ICD-10-CM | POA: Diagnosis not present

## 2023-04-06 DIAGNOSIS — E119 Type 2 diabetes mellitus without complications: Secondary | ICD-10-CM | POA: Diagnosis not present

## 2023-04-06 DIAGNOSIS — Z23 Encounter for immunization: Secondary | ICD-10-CM | POA: Diagnosis not present

## 2023-04-06 DIAGNOSIS — E559 Vitamin D deficiency, unspecified: Secondary | ICD-10-CM

## 2023-04-06 DIAGNOSIS — E1122 Type 2 diabetes mellitus with diabetic chronic kidney disease: Secondary | ICD-10-CM

## 2023-04-06 DIAGNOSIS — C61 Malignant neoplasm of prostate: Secondary | ICD-10-CM

## 2023-04-06 DIAGNOSIS — Z87442 Personal history of urinary calculi: Secondary | ICD-10-CM

## 2023-04-06 DIAGNOSIS — N183 Chronic kidney disease, stage 3 unspecified: Secondary | ICD-10-CM

## 2023-04-06 DIAGNOSIS — E785 Hyperlipidemia, unspecified: Secondary | ICD-10-CM

## 2023-04-06 DIAGNOSIS — N521 Erectile dysfunction due to diseases classified elsewhere: Secondary | ICD-10-CM

## 2023-04-06 DIAGNOSIS — Z794 Long term (current) use of insulin: Secondary | ICD-10-CM

## 2023-04-06 DIAGNOSIS — G4733 Obstructive sleep apnea (adult) (pediatric): Secondary | ICD-10-CM

## 2023-04-06 DIAGNOSIS — E1169 Type 2 diabetes mellitus with other specified complication: Secondary | ICD-10-CM

## 2023-04-06 DIAGNOSIS — E349 Endocrine disorder, unspecified: Secondary | ICD-10-CM

## 2023-04-06 DIAGNOSIS — Z79899 Other long term (current) drug therapy: Secondary | ICD-10-CM

## 2023-04-06 DIAGNOSIS — E11319 Type 2 diabetes mellitus with unspecified diabetic retinopathy without macular edema: Secondary | ICD-10-CM

## 2023-04-06 MED ORDER — TIRZEPATIDE 2.5 MG/0.5ML ~~LOC~~ SOAJ: 2.5000 mg | SUBCUTANEOUS | 2 refills | Status: AC

## 2023-04-06 NOTE — Patient Instructions (Signed)
Tirzepatide Injection What is this medication? TIRZEPATIDE (tir ZEP a tide) treats type 2 diabetes. It works by increasing insulin levels in your body, which decreases your blood sugar (glucose). It also reduces the amount of sugar released into your blood and slows down your digestion. Changes to diet and exercise are often combined with this medication. This medicine may be used for other purposes; ask your health care provider or pharmacist if you have questions. COMMON BRAND NAME(S): MOUNJARO What should I tell my care team before I take this medication? They need to know if you have any of these conditions: Endocrine tumors (MEN 2) or if someone in your family had these tumors Eye disease, vision problems Gallbladder disease History of pancreatitis Kidney disease Stomach or intestine problems Thyroid cancer or if someone in your family had thyroid cancer An unusual or allergic reaction to tirzepatide, other medications, foods, dyes, or preservatives Pregnant or trying to get pregnant Breast-feeding How should I use this medication? This medication is injected under the skin. You will be taught how to prepare and give it. It is given once every week (every 7 days). Keep taking it unless your health care provider tells you to stop. If you use this medication with insulin, you should inject this medication and the insulin separately. Do not mix them together. Do not give the injections right next to each other. Change (rotate) injection sites with each injection. This medication comes with INSTRUCTIONS FOR USE. Ask your pharmacist for directions on how to use this medication. Read the information carefully. Talk to your pharmacist or care team if you have questions. It is important that you put your used needles and syringes in a special sharps container. Do not put them in a trash can. If you do not have a sharps container, call your pharmacist or care team to get one. A special MedGuide  will be given to you by the pharmacist with each prescription and refill. Be sure to read this information carefully each time. Talk to your care team about the use of this medication in children. Special care may be needed. Overdosage: If you think you have taken too much of this medicine contact a poison control center or emergency room at once. NOTE: This medicine is only for you. Do not share this medicine with others. What if I miss a dose? If you miss a dose, take it as soon as you can unless it is more than 4 days (96 hours) late. If it is more than 4 days late, skip the missed dose. Take the next dose at the normal time. Do not take 2 doses within 3 days of each other. What may interact with this medication? Alcohol Antiviral medications for HIV or AIDS Aspirin and aspirin-like medications Beta blockers, such as atenolol, metoprolol, propranolol Certain medications for blood pressure, heart disease, irregular heart beat Chromium Clonidine Diuretics Estrogen or progestin hormones Fenofibrate Gemfibrozil Guanethidine Isoniazid Lanreotide Male hormones or anabolic steroids MAOIs, such as Marplan, Nardil, and Parnate Medications for weight loss Medications for allergies, asthma, cold, or cough Medications for depression, anxiety, or mental health conditions Niacin Nicotine NSAIDs, medications for pain and inflammation, such as ibuprofen or naproxen Octreotide Other medications for diabetes, such as glyburide, glipizide, or glimepiride Pasireotide Pentamidine Phenytoin Probenecid Quinolone antibiotics, such as ciprofloxacin, levofloxacin, ofloxacin Reserpine Some herbal dietary supplements Steroid medications, such as prednisone or cortisone Sulfamethoxazole; trimethoprim Thyroid hormones Warfarin This list may not describe all possible interactions. Give your health care provider a  list of all the medicines, herbs, non-prescription drugs, or dietary supplements you use.  Also tell them if you smoke, drink alcohol, or use illegal drugs. Some items may interact with your medicine. What should I watch for while using this medication? Visit your care team for regular checks on your progress. You may need blood work done while you are taking this medication. Your care team will monitor your HbA1C (A1C). This test shows what your average blood sugar (glucose) level was over the past 2 to 3 months. Know the symptoms of low blood sugar and know how to treat it. Always carry a source of quick sugar with you. Examples include hard sugar candy or glucose tablets. Make sure others know that you can choke if you eat or drink if your blood sugar is too low and you are unable to care for yourself. Get medical help at once. Tell your care team if you have high blood sugar. Your medication dose may change if your body is under stress. Some types of stress that may affect your blood sugar include fever, infection, and surgery. Check with your care team if you have severe diarrhea, nausea, and vomiting, or if you sweat a lot. The loss of too much body fluid may make it dangerous for you to take this medication. Do not share pens or cartridges with anyone, even if the needle is changed. Each pen should only be used by one person. Sharing could cause an infection. Wear a medical ID bracelet or chain. Carry a card that describes your condition. List the medications and doses you take on the card. Talk to your care team about your risk of cancer. You may be more at risk for certain types of cancer if you take this medication. Estrogen and progestin hormones may not work as well while you are taking this medication. If you take these as pills by mouth, your care team may recommend another type of contraception for 4 weeks after you start this medication and for 4 weeks after each dose increase. Talk to your care team about contraceptive options. They can help you find the option that works for  you. What side effects may I notice from receiving this medication? Side effects that you should report to your care team as soon as possible: Allergic reactions or angioedema--skin rash, itching or hives, swelling of the face, eyes, lips, tongue, arms, or legs, trouble swallowing or breathing Change in vision Dehydration--increased thirst, dry mouth, feeling faint or lightheaded, headache, dark yellow or brown urine Gallbladder problems--severe stomach pain, nausea, vomiting, fever Kidney injury--decrease in the amount of urine, swelling of the ankles, hands, or feet Pancreatitis--severe stomach pain that spreads to your back or gets worse after eating or when touched, fever, nausea, vomiting Thoughts of suicide or self-harm, worsening mood, feelings of depression Thyroid cancer--new mass or lump in the neck, pain or trouble swallowing, trouble breathing, hoarseness Side effects that usually do not require medical attention (report these to your care team if they continue or are bothersome): Diarrhea Loss of appetite Nausea Upset stomach This list may not describe all possible side effects. Call your doctor for medical advice about side effects. You may report side effects to FDA at 1-800-FDA-1088. Where should I keep my medication? Keep out of the reach of children and pets. Refrigeration (preferred): Store in the refrigerator. Keep this medication in the original carton until you are ready to take it. Do not freeze. Protect from light. Get rid of opened vials  after use, even if there is medication left. Get rid of any unopened vials or pens after the expiration date. Room Temperature: This medication may be stored at room temperature below 30 degrees C (86 degrees F) for up to 21 days. Keep this medication in the original carton until you are ready to take it. Protect from light. Avoid exposure to extreme heat. Get rid of opened vials after use, even if there is medication left. Get rid of any  unopened vials or pens after 21 days, or after they expire, whichever is first. To get rid of medications that are no longer needed or have expired: Take the medication to a medication take-back program. Check with your pharmacy or law enforcement to find a location. If you cannot return the medication, ask your pharmacist or care team how to get rid of this medication safely. NOTE: This sheet is a summary. It may not cover all possible information. If you have questions about this medicine, talk to your doctor, pharmacist, or health care provider.  2024 [employer] (2022-08-11 00:00:00)

## 2023-04-06 NOTE — Progress Notes (Signed)
Follow Up  Assessment and Plan:  Essential hypertension Slightly elevated in clinic - continue medications as directed. Discussed DASH (Dietary Approaches to Stop Hypertension) DASH diet is lower in sodium than a typical American diet. Cut back on foods that are high in saturated fat, cholesterol, and trans fats. Eat more whole-grain foods, fish, poultry, and nuts Remain active and exercise as tolerated daily.  Monitor BP at home-Call if greater than 130/80.  Check CMP/CBC  Type 2 diabetes mellitus with diabetic nephropathy (HCC) Continue Glimepiride, Glucophage-xr, Glargine 40 units.  Change Ozempic to [facility] given A1c continues to be >7% and weight of >300 lb, BMI at ~40. Discussed increase in ozempic next month after completion of current Rx packet. Education: Reviewed 'ABCs' of diabetes management  Discussed goals to be met and/or maintained include A1C (<7) Blood pressure (<130/80) Cholesterol (LDL <70) Continue Eye Exam yearly  Continue Dental Exam Q6 mo Discussed dietary recommendations Discussed Physical Activity recommendations Check A1C  Hyperlipidemia associated with type 2 diabetes mellitus (HCC) Discussed lifestyle modifications. Recommended diet heavy in fruits and veggies, omega 3's. Decrease consumption of animal meats, cheeses, and dairy products. Remain active and exercise as tolerated. Continue to monitor. Check lipids/TSH  CKD stage 3 due to type 2 diabetes mellitus (HCC)/ Microalbuminuria (HCC) Discussed how what you eat and drink can aide in kidney protection. Stay well hydrated. Avoid high salt foods. Avoid NSAIDS. Keep BP and BG well controlled.   Take medications as prescribed. Remain active and exercise as tolerated daily. Maintain weight.  Continue to monitor. Check CMP/GFR/Microablumin  Erectile dysfunction associated with type 2 diabetes mellitus (HCC) Continue Sildenafil Suggest Zinc supplement Continue to monitor  Morbid obesity  (HCC) - BMI 40 with OSA Discussed appropriate BMI Diet modification. Physical activity. Encouraged/praised to build confidence.  Hereditary hemochromatosis (HCC) Monitor labs, check CBC/iron/ferritin  OSA on CPAP Continue CPAP compliance  Medication management All medications discussed and reviewed in full. All questions and concerns regarding medications addressed.    Vitamin D deficiency Continue supplement for goal of 60-100 Monitor Vitamin D levels  Testosterone deficiency Continue to monitor  History of nephrolithiasis Controlled Increase water intake, urology follows  Prostate cancer Urology following  Need for flu vaccine Administered - patient tolerated well without complications.   Orders Placed This Encounter  Procedures   Fluzone Trivalent Flu Vaccine ([place] dose preparattion)   CBC with Differential/Platelet   COMPLETE METABOLIC PANEL WITH GFR   Lipid panel   Hemoglobin A1c   Meds ordered this encounter  Medications   tirzepatide (MOUNJARO) 2.5 MG/0.5ML Pen    Sig: Inject 2.5 mg into the skin once a week.    Dispense:  2 mL    Refill:  2    Order Specific Question:   Supervising Provider    Answer:   Lucky Cowboy [phone]   Notify office for further evaluation and treatment, questions or concerns if any reported s/s fail to improve.   The patient was advised to call back or seek an in-person evaluation if any symptoms worsen or if the condition fails to improve as anticipated.   Further disposition pending results of labs. Discussed med's effects and SE's.    I discussed the assessment and treatment plan with the patient. The patient was provided an opportunity to ask questions and all were answered. The patient agreed with the plan and demonstrated an understanding of the instructions.  Discussed med's effects and SE's. Screening labs and tests as requested with regular follow-up as recommended.  I provided 30 minutes of face-to-face time  during this encounter including counseling, chart review, and critical decision making was preformed.  Today's Plan of Care is based on a patient-centered health care approach known as shared decision making - the decisions, tests and treatments allow for patient preferences and values to be balanced with clinical evidence.     Future Appointments  Date Time Provider Department Center  06/06/2023  9:00 AM Adela Glimpse, NP [facility] None  02/22/2024  3:00 PM Lomax, Amy, NP [facility] None   HPI 62 y.o. AA male  presents for a general follow up. He has Essential hypertension; T2_NIDDM with CKD III (HCC); Hyperlipidemia associated with type 2 diabetes mellitus (HCC); Testosterone deficiency; Vitamin D deficiency; Medication management; OSA (obstructive sleep apnea); History of nephrolithiasis; Morbid obesity (HCC); Hemochromatosis; Erectile dysfunction associated with type 2 diabetes mellitus (HCC); FH: hypertension; CKD stage 3 due to type 2 diabetes mellitus (HCC); Prostate cancer (HCC); Diabetic retinopathy associated with type 2 diabetes mellitus (HCC); RBBB; AV block, 1st degree; Microalbuminuria due to type 2 diabetes mellitus (HCC); and History of posttraumatic stress disorder (PTSD) on their problem list.  Overall he reports feeling well today.  He has no new concerns at this time.  He has OSA on CPAP, reports 90% compliance with restorative sleep.   BMI is Body mass index is 38.93 kg/m., he has been working on diet and exercise. Wt Readings from Last 3 Encounters:  04/06/23 (!) 303 lb 3.2 oz (137.5 kg)  02/15/23 (!) 304 lb (137.9 kg)  12/26/22 (!) 305 lb 12.8 oz (138.7 kg)   His blood pressure is not checked at home, he is on lisinopril 20 mg, today their BP is BP: (!) 140/80, last check was 100/70 He does workout.  He denies chest pain, shortness of breath, dizziness.   He has been working on diet and exercise for diabetes, long hx of poorly controlled A1C on review with patient  today  with CKD III he is on lisinopril  Hyperlipidemia not statin, cholesterol IS at goal less than 70, declines  ED, has viagra He is on ASA MF 2000 mg a day total He is on lantus 40 units Ozempic 1 mg weekly He is on amaryl 4 mg 1 pill BID Eye exam: Dr. Hyacinth Meeker 2024 with retinopathy Meter: Freestyle denies paresthesia of the feet, polydipsia and polyuria.  Last A1C in the office was and is slow trending down Lab Results  Component Value Date   HGBA1C 8.1 (H) 12/26/2022   Lab Results  Component Value Date   GFRAA 55 (L) 04/30/2020   Lab Results  Component Value Date   CHOL 137 12/26/2022   HDL 48 12/26/2022   LDLCALC 59 12/26/2022   TRIG 259 (H) 12/26/2022   CHOLHDL 2.9 12/26/2022   Patient is on Vitamin D supplement, taking 16109 IU daily Lab Results  Component Value Date   VD25OH 1 09/19/2022   He is has a history of testosterone deficiency and is on testosterone replacement at this time, only on 2 pumps each arm a day He states that the testosterone is help with his energy, libido, muscle mass.  Lab Results  Component Value Date   TESTOSTERONE 327 05/06/2021   Has history of kidney stone, recently elevated PSAs up to 11+, follows with Dr. Marlou Porch. Per his note in 11/2020 patient had MRI showing PI-RADS 2, monitoring.  Lab Results  Component Value Date   PSA 6.65 (H) 04/30/2020   PSA 4.1 (H) 05/01/2019  PSA 3.8 06/05/2018   Hx of hemochromatosis, currently monitoring only, did see hematologist, switching off of testosterone injections helped.  Lab Results  Component Value Date   IRON 89 05/30/2022   TIBC 222 (L) 05/30/2022   FERRITIN 338 05/30/2022      Latest Ref Rng & Units 12/26/2022    4:16 PM 09/19/2022   12:15 PM 05/30/2022    3:13 PM  CBC  WBC 3.8 - 10.8 Thousand/uL 5.7  5.0  4.6   Hemoglobin 13.2 - 17.1 g/dL 75.6  43.3  29.5   Hematocrit 38.5 - 50.0 % 43.6  44.6  42.4   Platelets 140 - 400 Thousand/uL 243  230  247       Current Medications:    Current Outpatient Medications (Endocrine & Metabolic):    glimepiride (AMARYL) 4 MG tablet, Take 1 tablet 2 x /day with Meals for Diabetes (Patient taking differently: Take 4 mg by mouth 2 (two) times daily. Take 1 tablet 2 x /day with Meals for Diabetes)   insulin glargine (LANTUS SOLOSTAR) 100 UNIT/ML Solostar Pen, Inject 40 Units into the skin daily. INJECT 30 TO 40 UNITS DAILY FOR DIABETES   metFORMIN (GLUCOPHAGE-XR) 500 MG 24 hr tablet, TAKE 2 TABLETS TWICE A DAY WITH MEALS FOR DIABETES   Semaglutide, 1 MG/DOSE, (OZEMPIC, 1 MG/DOSE,) 4 MG/3ML SOPN, Inject 1 mg as directed once a week.  Current Outpatient Medications (Cardiovascular):    lisinopril (ZESTRIL) 20 MG tablet, Take  1 tablet  Daily  for BP (Patient taking differently: Take 20 mg by mouth daily. Take  1 tablet  Daily  for BP)   sildenafil (VIAGRA) 100 MG tablet, Take 1/2 to 1 tablet  every Day as needed for XXXX                                                                  /                                   TAKE ONE-HALF (1/2) TO ONE TABLET DAILY AS NEEDED AS DIRECTED   Current Outpatient Medications (Analgesics):    aspirin EC 81 MG tablet, Take 81 mg by mouth daily.   traMADol (ULTRAM) 50 MG tablet, Take 1-2 tablets (50-100 mg total) by mouth every 6 (six) hours as needed for moderate pain.   Current Outpatient Medications (Other):    Cholecalciferol (VITAMIN D-3) 125 MCG (5000 UT) TABS, Take 15,000 Units by mouth daily. (Patient taking differently: Take 1 capsule by mouth 2 (two) times daily.)   CINNAMON PO, Take 2,000 mg by mouth 2 (two) times daily. Takes 2 tablets twice a day.   glucose blood (FREESTYLE LITE) test strip, CHECK BLOOD SUGAR 1 TIME A DAY-dx-E11.29.   glucose monitoring kit (FREESTYLE) monitoring kit, Check blood sugar 1 time a day-DX-E11.29   Lancets (FREESTYLE) lancets, CHECK BLOOD SUGAR 1 TIME A DAY-DX-E11.29.   terbinafine (LAMISIL) 1 % cream, Apply 1 Application topically 2 (two) times daily. For  4 weeks   tamsulosin (FLOMAX) 0.4 MG CAPS capsule, Take 1 capsule (0.4 mg total) by mouth daily. (Patient not taking: Reported on 12/29/2021)  Health Maintenance:  Immunization History  Administered Date(s)  Administered   DTaP 04/02/2012   Influenza Inj Mdck Quad With Preservative 03/20/2018   Influenza Split 02/17/2014   Influenza,inj,Quad PF,6+ Mos 05/06/2021   Influenza-Unspecified 01/13/2017   PFIZER Comirnaty(Gray Top)Covid-19 Tri-Sucrose Vaccine 04/27/2021   PFIZER(Purple Top)SARS-COV-2 Vaccination 08/22/2019, 09/16/2019   PPD Test 06/05/2018   Pneumococcal Polysaccharide-23 04/02/2012   Medical History:  Past Medical History:  Diagnosis Date   Chronic kidney disease (CKD), stage III (moderate) (HCC)    Erectile dysfunction 09/11/2014   Hereditary hemochromatosis (HCC)    previously followed by dr Myna Hidalgo (hemaology) , H63D gene mutation,   hx phlebotomies and / or donate blood   History of kidney stones    Hyperlipidemia    Hypertension    Hypogonadism male    Malignant neoplasm prostate (HCC) 06/2021   urologist-- dr herrick/  radiation oncology-- dr Kathrynn Running---  dx 02/ 2023  Gleason 3+4   Neuropathy    OSA on CPAP    pt stated uses nightly   Right bundle branch block    Type 2 diabetes mellitus treated with insulin (HCC)    followed by pcp   (12-13-2021  pt stated checks blood sugar daily fasting, average 120-130)   Allergies No Known Allergies  SURGICAL HISTORY He  has a past surgical history that includes Ankle surgery (Left, 2002); Rhinoplasty (2001); Extracorporeal shock wave lithotripsy (Left, 09-25-2014); Cystoscopy/retrograde/ureteroscopy/stone extraction with basket (Left, 12/05/2014); Cystoscopy w/ retrogrades (Left, 12/05/2014); Colonoscopy (2018); Radioactive seed implant (N/A, 12/16/2021); and SPACE OAR INSTILLATION (N/A, 12/16/2021).   FAMILY HISTORY His family history includes Breast cancer (age of onset: 66) in his mother; Diabetes in his father; Heart  attack in his paternal grandfather; Hypertension in his mother; Uterine cancer (age of onset: 26) in his daughter.   SOCIAL HISTORY He  reports that he has never smoked. He has never used smokeless tobacco. He reports current alcohol use. He reports that he does not use drugs.   Review of Systems  Constitutional:  Negative for malaise/fatigue and weight loss.  HENT:  Negative for hearing loss and tinnitus.   Eyes:  Negative for blurred vision and double vision.  Respiratory:  Negative for cough, sputum production, shortness of breath and wheezing.   Cardiovascular:  Negative for chest pain, palpitations, orthopnea, claudication, leg swelling and PND.  Gastrointestinal:  Negative for abdominal pain, blood in stool, constipation, diarrhea, heartburn, melena, nausea and vomiting.  Genitourinary: Negative.   Musculoskeletal:  Negative for falls, joint pain and myalgias.  Skin:  Negative for rash.  Neurological:  Negative for dizziness, tingling, sensory change, weakness and headaches.  Endo/Heme/Allergies:  Negative for polydipsia.  Psychiatric/Behavioral: Negative.  Negative for depression, memory loss, substance abuse and suicidal ideas. The patient is not nervous/anxious and does not have insomnia.   All other systems reviewed and are negative.   Physical Exam: Estimated body mass index is 38.93 kg/m as calculated from the following:   Height as of this encounter: 6\' 2"  (1.88 m).   Weight as of this encounter: 303 lb 3.2 oz (137.5 kg). BP (!) 140/80   Pulse 85   Temp 98.4 F (36.9 C)   Ht 6\' 2"  (1.88 m)   Wt (!) 303 lb 3.2 oz (137.5 kg)   SpO2 98%   BMI 38.93 kg/m  General Appearance: Well nourished, in no apparent distress. Eyes: PERRLA, EOMs, conjunctiva no swelling or erythema Sinuses: No Frontal/maxillary tenderness ENT/Mouth: Ext aud canals clear after removal in the office, normal light reflex with TMs without erythema after bilateral  ears were cleaned in the office,  bulging. Good dentition. No erythema, swelling, or exudate on post pharynx. Tonsils not swollen or erythematous. Hearing normal.  Neck: Supple, thyroid normal. No bruits Respiratory: Respiratory effort normal, BS equal bilaterally without rales, rhonchi, wheezing or stridor. Cardio: RRR without murmurs, rubs or gallops. Brisk peripheral pulses without edema.  Chest: symmetric, with normal excursions and percussion. Abdomen: Soft, +BS. Non tender, no guarding, rebound, hernias, masses, or organomegaly. .  Lymphatics: Non tender without lymphadenopathy.  Genitourinary: defer urologist Musculoskeletal: Full ROM all peripheral extremities,5/5 strength, and normal gait.Marland Kitchen  Neuro: Cranial nerves intact, reflexes equal bilaterally. Normal muscle tone, no cerebellar symptoms. Sensation intact to monofilament.   Psych: Awake and oriented X 3, normal affect, Insight and Judgment appropriate.  Skin: dark nevus left medial foot, unchanged, approx 8 mm Warm, dry without rashes, ecchymosis  Adela Glimpse, NP 4:13 PM [facility]

## 2023-04-07 LAB — CBC WITH DIFFERENTIAL/PLATELET
Absolute Lymphocytes: 1944 {cells}/uL (ref 850–3900)
Absolute Monocytes: 278 {cells}/uL (ref 200–950)
Basophils Absolute: 29 {cells}/uL (ref 0–200)
Basophils Relative: 0.6 %
Eosinophils Absolute: 187 {cells}/uL (ref 15–500)
Eosinophils Relative: 3.9 %
HCT: 43.1 % (ref 38.5–50.0)
Hemoglobin: 14.5 g/dL (ref 13.2–17.1)
MCH: 29.4 pg (ref 27.0–33.0)
MCHC: 33.6 g/dL (ref 32.0–36.0)
MCV: 87.2 fL (ref 80.0–100.0)
MPV: 11.7 fL (ref 7.5–12.5)
Monocytes Relative: 5.8 %
Neutro Abs: 2362 {cells}/uL (ref 1500–7800)
Neutrophils Relative %: 49.2 %
Platelets: 219 10*3/uL (ref 140–400)
RBC: 4.94 10*6/uL (ref 4.20–5.80)
RDW: 12 % (ref 11.0–15.0)
Total Lymphocyte: 40.5 %
WBC: 4.8 10*3/uL (ref 3.8–10.8)

## 2023-04-07 LAB — COMPLETE METABOLIC PANEL WITH GFR
AG Ratio: 1.6 (calc) (ref 1.0–2.5)
ALT: 18 U/L (ref 9–46)
AST: 12 U/L (ref 10–35)
Albumin: 4.1 g/dL (ref 3.6–5.1)
Alkaline phosphatase (APISO): 139 U/L (ref 35–144)
BUN/Creatinine Ratio: 13 (calc) (ref 6–22)
BUN: 21 mg/dL (ref 7–25)
CO2: 27 mmol/L (ref 20–32)
Calcium: 9.6 mg/dL (ref 8.6–10.3)
Chloride: 106 mmol/L (ref 98–110)
Creat: 1.63 mg/dL — ABNORMAL HIGH (ref 0.70–1.35)
Globulin: 2.6 g/dL (ref 1.9–3.7)
Glucose, Bld: 280 mg/dL — ABNORMAL HIGH (ref 65–139)
Potassium: 4.2 mmol/L (ref 3.5–5.3)
Sodium: 141 mmol/L (ref 135–146)
Total Bilirubin: 0.9 mg/dL (ref 0.2–1.2)
Total Protein: 6.7 g/dL (ref 6.1–8.1)
eGFR: 48 mL/min/{1.73_m2} — ABNORMAL LOW (ref >=60)

## 2023-04-07 LAB — HEMOGLOBIN A1C
Hgb A1c MFr Bld: 10 %{Hb} — ABNORMAL HIGH (ref <5.7)
Mean Plasma Glucose: 240 mg/dL
eAG (mmol/L): 13.3 mmol/L

## 2023-04-07 LAB — LIPID PANEL
Cholesterol: 131 mg/dL (ref <200)
HDL: 45 mg/dL (ref >=40)
LDL Cholesterol (Calc): 59 mg/dL
Non-HDL Cholesterol (Calc): 86 mg/dL (ref <130)
Total CHOL/HDL Ratio: 2.9 (calc) (ref <5.0)
Triglycerides: 195 mg/dL — ABNORMAL HIGH (ref <150)

## 2023-04-19 ENCOUNTER — Encounter: Payer: Self-pay | Admitting: Internal Medicine

## 2023-05-08 ENCOUNTER — Ambulatory Visit (INDEPENDENT_AMBULATORY_CARE_PROVIDER_SITE_OTHER): Admitting: Nurse Practitioner

## 2023-05-08 ENCOUNTER — Encounter: Payer: Self-pay | Admitting: Nurse Practitioner

## 2023-05-08 ENCOUNTER — Ambulatory Visit
Admission: RE | Admit: 2023-05-08 | Discharge: 2023-05-08 | Disposition: A | Discharge status: Home / Self Care | Source: Ambulatory Visit | Attending: Nurse Practitioner | Admitting: Nurse Practitioner

## 2023-05-08 ENCOUNTER — Encounter: Payer: Self-pay | Admitting: Family

## 2023-05-08 VITALS — BP 146/86 | HR 84 | Temp 97.7°F | Ht 74.0 in | Wt 310.6 lb

## 2023-05-08 DIAGNOSIS — I10 Essential (primary) hypertension: Secondary | ICD-10-CM

## 2023-05-08 DIAGNOSIS — E119 Type 2 diabetes mellitus without complications: Secondary | ICD-10-CM | POA: Diagnosis not present

## 2023-05-08 DIAGNOSIS — M25812 Other specified joint disorders, left shoulder: Secondary | ICD-10-CM | POA: Diagnosis not present

## 2023-05-08 DIAGNOSIS — Z794 Long term (current) use of insulin: Secondary | ICD-10-CM

## 2023-05-08 DIAGNOSIS — E1169 Type 2 diabetes mellitus with other specified complication: Secondary | ICD-10-CM

## 2023-05-08 NOTE — Progress Notes (Signed)
 Assessment and Plan:  Diagnoses and all orders for this visit:  Essential hypertension - continue medications- lisinopril  20 mg every day - continue DASH diet, exercise and monitor at home. Call if greater than 130/80.    Type 2 diabetes mellitus treated with insulin  (HCC) Continue Glimepiride  4 mg BID with meals, lantus  40 units every day, Metformin  500 mg 2 tabs BID, Mounjaro  2.5 mg SQ QW Increase Mounjaro  as tolerated Continue to monitor blood sugars   Type 2 diabetes mellitus with morbid obesity (HCC) Continue diet and exercise Increase Mounjaro  as tolerated Continue to monitor blood sugars  Mass of joint of left shoulder Will treat further pending xray results If not bone related may send to dermatology -     DG Shoulder Left; Future       Further disposition pending results of labs. Discussed med's effects and SE's.   Over 30 minutes of exam, counseling, chart review, and critical decision making was performed.   Future Appointments  Date Time Provider Department Center  07/10/2023  3:00 PM Laurice President, NP [facility] None  02/22/2024  3:00 PM Lomax, Amy, NP [facility] None    ------------------------------------------------------------------------------------------------------------------   HPI BP (!) 146/86   Pulse 84   Temp 97.7 F (36.5 C)   Ht 6' 2 (1.88 m)   Wt (!) 310 lb 9.6 oz (140.9 kg)   SpO2 99%   BMI 39.88 kg/m   63 y.o.male presents for knot on his left shoulder, states it is not painful and has been present for approximately 1 month. Has stayed the same size since starting.    BP well controlled with Lisinopril  20 mg every day- had bacon this morning and that usually raises his BP BP Readings from Last 3 Encounters:  05/08/23 (!) 146/86  04/06/23 (!) 140/80  02/15/23 (!) 164/94  Denies headaches, chest pain, shortness of breath and dizziness   Currently on Glimepiride  4 mg BID with meals, lantus  40 units every day, Metformin  500 mg 2  tabs BID, Mounjaro  2.5 mg SQ QW.  Blood sugars running 110-120's Lab Results  Component Value Date   HGBA1C 10.0 (H) 04/06/2023     BMI is Body mass index is 39.88 kg/m., he has not been working on diet and exercise. Wt Readings from Last 3 Encounters:  05/08/23 (!) 310 lb 9.6 oz (140.9 kg)  04/06/23 (!) 303 lb 3.2 oz (137.5 kg)  02/15/23 (!) 304 lb (137.9 kg)    Past Medical History:  Diagnosis Date   Chronic kidney disease (CKD), stage III (moderate) (HCC)    Erectile dysfunction 09/11/2014   Hereditary hemochromatosis (HCC)    previously followed by dr timmy (hemaology) , H63D gene mutation,   hx phlebotomies and / or donate blood   History of kidney stones    Hyperlipidemia    Hypertension    Hypogonadism male    Malignant neoplasm prostate (HCC) 06/2021   urologist-- dr herrick/  radiation oncology-- dr patrcia---  dx 02/ 2023  Gleason 3+4   Neuropathy    OSA on CPAP    pt stated uses nightly   Right bundle branch block    Type 2 diabetes mellitus treated with insulin  (HCC)    followed by pcp   (12-13-2021  pt stated checks blood sugar daily fasting, average 120-130)     No Known Allergies  Current Outpatient Medications on File Prior to Visit  Medication Sig   aspirin EC 81 MG tablet Take 81 mg by mouth daily.  Cholecalciferol (VITAMIN D -3) 125 MCG (5000 UT) TABS Take 15,000 Units by mouth daily. (Patient taking differently: Take 1 capsule by mouth 2 (two) times daily.)   CINNAMON PO Take 2,000 mg by mouth 2 (two) times daily. Takes 2 tablets twice a day.   glimepiride  (AMARYL ) 4 MG tablet Take 1 tablet 2 x /day with Meals for Diabetes (Patient taking differently: Take 4 mg by mouth 2 (two) times daily. Take 1 tablet 2 x /day with Meals for Diabetes)   glucose blood (FREESTYLE LITE) test strip CHECK BLOOD SUGAR 1 TIME A DAY-dx-E11.29.   glucose monitoring kit (FREESTYLE) monitoring kit Check blood sugar 1 time a day-DX-E11.29   insulin  glargine (LANTUS  SOLOSTAR)  100 UNIT/ML Solostar Pen Inject 40 Units into the skin daily. INJECT 30 TO 40 UNITS DAILY FOR DIABETES   Lancets (FREESTYLE) lancets CHECK BLOOD SUGAR 1 TIME A DAY-DX-E11.29.   lisinopril  (ZESTRIL ) 20 MG tablet Take  1 tablet  Daily  for BP (Patient taking differently: Take 20 mg by mouth daily. Take  1 tablet  Daily  for BP)   metFORMIN  (GLUCOPHAGE -XR) 500 MG 24 hr tablet TAKE 2 TABLETS TWICE A DAY WITH MEALS FOR DIABETES   sildenafil  (VIAGRA ) 100 MG tablet Take 1/2 to 1 tablet  every Day as needed for XXXX                                                                  /                                   TAKE ONE-HALF (1/2) TO ONE TABLET DAILY AS NEEDED AS DIRECTED   tirzepatide  (MOUNJARO ) 2.5 MG/0.5ML Pen Inject 2.5 mg into the skin once a week.   tamsulosin  (FLOMAX ) 0.4 MG CAPS capsule Take 1 capsule (0.4 mg total) by mouth daily. (Patient not taking: Reported on 05/08/2023)   No current facility-administered medications on file prior to visit.    ROS: all negative except above.   Physical Exam:  BP (!) 146/86   Pulse 84   Temp 97.7 F (36.5 C)   Ht 6' 2 (1.88 m)   Wt (!) 310 lb 9.6 oz (140.9 kg)   SpO2 99%   BMI 39.88 kg/m   General Appearance: Well nourished, in no apparent distress. Eyes: PERRLA, EOMs, conjunctiva no swelling or erythema Neck: Supple, thyroid  normal.  Respiratory: Respiratory effort normal, BS equal bilaterally without rales, rhonchi, wheezing or stridor.  Cardio: RRR with no MRGs. Brisk peripheral pulses without edema.  Abdomen: Soft, + BS.  Non tender, no guarding, rebound, hernias, masses. Lymphatics: Non tender without lymphadenopathy.  Musculoskeletal: Full ROM, 5/5 strength, normal gait. Firm, immobile area medial left shoulder, nontender- approx 3 cm- no redness or warmth Skin: Warm, dry without rashes, lesions, ecchymosis.  Neuro: Cranial nerves intact. Normal muscle tone, no cerebellar symptoms. Sensation intact.  Psych: Awake and oriented X 3,  normal affect, Insight and Judgment appropriate.     Kapri Nero E Syncere Kaminski, NP 11:52 AM [facility]

## 2023-05-11 ENCOUNTER — Encounter: Admitting: Nurse Practitioner

## 2023-05-27 ENCOUNTER — Other Ambulatory Visit: Payer: Self-pay | Admitting: Internal Medicine

## 2023-05-29 ENCOUNTER — Encounter: Admitting: Nurse Practitioner

## 2023-06-06 ENCOUNTER — Encounter: Admitting: Nurse Practitioner

## 2023-07-10 ENCOUNTER — Encounter: Admitting: Nurse Practitioner

## 2024-02-15 ENCOUNTER — Encounter: Payer: Self-pay | Admitting: Family

## 2024-02-22 ENCOUNTER — Ambulatory Visit: Admitting: Family Medicine

## 2024-03-30 ENCOUNTER — Encounter: Payer: Self-pay | Admitting: Internal Medicine

## 2024-04-03 ENCOUNTER — Encounter: Payer: Self-pay | Admitting: Family Medicine

## 2024-04-03 ENCOUNTER — Telehealth: Payer: Self-pay

## 2024-04-03 ENCOUNTER — Ambulatory Visit: Admitting: Family Medicine

## 2024-04-03 VITALS — BP 142/86 | HR 89 | Ht 74.0 in | Wt 300.0 lb

## 2024-04-03 DIAGNOSIS — G4733 Obstructive sleep apnea (adult) (pediatric): Secondary | ICD-10-CM | POA: Diagnosis not present
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
## 2024-04-03 NOTE — Telephone Encounter (Signed)
 Order sent to dme DME [facility]  [phone] [id number]

## 2024-04-03 NOTE — Progress Notes (Signed)
 Carlos [name]

## 2024-04-03 NOTE — Patient Instructions (Signed)

## 2024-05-28 ENCOUNTER — Telehealth: Payer: Self-pay | Admitting: Family Medicine

## 2024-05-28 NOTE — Telephone Encounter (Signed)
 Carlos [name]

## 2024-05-28 NOTE — Telephone Encounter (Signed)
 SABRA

## 2024-05-28 NOTE — Telephone Encounter (Signed)
 Will you attach a 30 day report, please? TY!

## 2025-04-21 ENCOUNTER — Ambulatory Visit: Admitting: Family Medicine
